# Patient Record
Sex: Male | Born: 1935 | Race: White | Hispanic: No | Marital: Married
Health system: Midwestern US, Academic
[De-identification: ages and names within clinical notes are randomized; demographics above are authoritative.]

---

## 2016-05-06 MED ORDER — WARFARIN 1 MG PO TAB
1 mg | ORAL_TABLET | Freq: Every day | ORAL | 3 refills | 90.00000 days | Status: DC
Start: 2016-05-06 — End: 2016-12-10

## 2016-05-07 MED ORDER — WARFARIN 1 MG PO TAB
ORAL_TABLET | Freq: Every day | ORAL | 1 refills | 90.00000 days | Status: DC
Start: 2016-05-07 — End: 2016-12-10

## 2016-07-01 ENCOUNTER — Encounter: Admit: 2016-07-01 | Discharge: 2016-07-01 | Payer: MEDICARE

## 2016-07-01 DIAGNOSIS — I480 Paroxysmal atrial fibrillation: Principal | ICD-10-CM

## 2016-07-02 ENCOUNTER — Encounter: Admit: 2016-07-02 | Discharge: 2016-07-02 | Payer: MEDICARE

## 2016-07-02 MED ORDER — WARFARIN 5 MG PO TAB
ORAL_TABLET | Freq: Every day | 1 refills | Status: CN
Start: 2016-07-02 — End: ?

## 2016-07-04 ENCOUNTER — Encounter: Admit: 2016-07-04 | Discharge: 2016-07-04 | Payer: MEDICARE

## 2016-07-04 MED ORDER — WARFARIN 5 MG PO TAB
ORAL_TABLET | Freq: Every day | 1 refills | Status: SS
Start: 2016-07-04 — End: 2016-12-10

## 2016-07-16 ENCOUNTER — Encounter: Admit: 2016-07-16 | Discharge: 2016-07-16 | Payer: MEDICARE

## 2016-07-16 DIAGNOSIS — I480 Paroxysmal atrial fibrillation: Principal | ICD-10-CM

## 2016-07-25 ENCOUNTER — Encounter: Admit: 2016-07-25 | Discharge: 2016-07-25 | Payer: MEDICARE

## 2016-07-25 DIAGNOSIS — I480 Paroxysmal atrial fibrillation: Principal | ICD-10-CM

## 2016-07-29 ENCOUNTER — Encounter: Admit: 2016-07-29 | Discharge: 2016-07-29 | Payer: MEDICARE

## 2016-07-29 NOTE — Telephone Encounter
Patient verbalized understanding and is willing to undertake the risk of holding his warfarin.  Patient will hold for the least amount of time.  He is calling to see if they will consider a lesser time than 5 days.  He currently will hold starting 7/6 and restart 7/11 taking 10 mg x 3 days then resuming 6 mg, recheck INR 7/18.

## 2016-07-29 NOTE — Telephone Encounter
-----   Message from Nehemiah Massed, MD sent at 07/29/2016  9:40 AM CDT -----  Regarding: RE: EGD and Biopsies  CJ: Mr. Pinkham CHA2DS2-VASc score is 3 for age and hypertension.  He is not a candidate for anticoagulation bridging.  He will have to accept the risk of holding his warfarin if required by the physician performing EGD.  He should be off warfarin for as short of a period as possible and resume warfarin as soon as his bleeding risk is no longer prohibitive.  Please document that the patient agrees to the risk.  Thanks. SBG  ----- Message -----  From: Avon Gully, RN  Sent: 07/29/2016   7:25 AM  To: Nehemiah Massed, MD  Subject: FW: EGD and Biopsies                              Patient has PAF, is having an EGD, ok to be off warfarin 5 days?  Ask if he will take responsibility for the risk of coming off warfarin?  Least amount of time off?  thanks  ----- Message -----  From: Dorene Sorrow, RN  Sent: 07/28/2016   2:54 PM  To: Avon Gully, RN  Subject: EGD and Biopsies                                 Call from Oliva Bustard at [facility]. Mr Diana is having EGD with possible biopsy on 7/11.Can he be off Warfarin for 5 days before the test?  Phone  [phone])  Fax     [phone])

## 2016-08-04 ENCOUNTER — Ambulatory Visit: Admit: 2016-08-04 | Discharge: 2016-08-04 | Payer: MEDICARE

## 2016-08-04 ENCOUNTER — Encounter: Admit: 2016-08-04 | Discharge: 2016-08-04 | Payer: MEDICARE

## 2016-08-04 DIAGNOSIS — I2510 Atherosclerotic heart disease of native coronary artery without angina pectoris: ICD-10-CM

## 2016-08-04 DIAGNOSIS — M069 Rheumatoid arthritis, unspecified: ICD-10-CM

## 2016-08-04 DIAGNOSIS — K529 Noninfective gastroenteritis and colitis, unspecified: ICD-10-CM

## 2016-08-04 DIAGNOSIS — I219 Acute myocardial infarction, unspecified: ICD-10-CM

## 2016-08-04 DIAGNOSIS — IMO0002 Squamous cell carcinoma: ICD-10-CM

## 2016-08-04 DIAGNOSIS — Z85828 Personal history of other malignant neoplasm of skin: ICD-10-CM

## 2016-08-04 DIAGNOSIS — L989 Disorder of the skin and subcutaneous tissue, unspecified: ICD-10-CM

## 2016-08-04 DIAGNOSIS — L568 Other specified acute skin changes due to ultraviolet radiation: ICD-10-CM

## 2016-08-04 DIAGNOSIS — B079 Viral wart, unspecified: ICD-10-CM

## 2016-08-04 DIAGNOSIS — R972 Elevated prostate specific antigen [PSA]: ICD-10-CM

## 2016-08-04 DIAGNOSIS — L853 Xerosis cutis: ICD-10-CM

## 2016-08-04 DIAGNOSIS — I8390 Asymptomatic varicose veins of unspecified lower extremity: ICD-10-CM

## 2016-08-04 DIAGNOSIS — I10 Essential (primary) hypertension: ICD-10-CM

## 2016-08-04 DIAGNOSIS — L818 Other specified disorders of pigmentation: ICD-10-CM

## 2016-08-04 DIAGNOSIS — L570 Actinic keratosis: ICD-10-CM

## 2016-08-04 DIAGNOSIS — D692 Other nonthrombocytopenic purpura: ICD-10-CM

## 2016-08-04 DIAGNOSIS — E785 Hyperlipidemia, unspecified: Principal | ICD-10-CM

## 2016-08-04 DIAGNOSIS — D229 Melanocytic nevi, unspecified: ICD-10-CM

## 2016-08-04 DIAGNOSIS — L821 Other seborrheic keratosis: ICD-10-CM

## 2016-08-04 DIAGNOSIS — K289 Gastrojejunal ulcer, unspecified as acute or chronic, without hemorrhage or perforation: ICD-10-CM

## 2016-08-04 NOTE — Progress Notes
Date of Service: 08/04/2016    Subjective:             Kenneth Robles is a 83 y.o. male.    History of Present Illness  Return. LV 03-2016  ???  1. H/o squamous cell carcinoma   - L superior helix???s/p Mohs 2015   ???  2. Actinic Keratosis/ Actinic Chelitis  - LN2 and PDT in the past. ???  - R post Arm. [id number]- inconclusive pathology. Re-biopsy 11-2015-c/w AK  - Using imiquimod once weekly to lip as well as [employer]     3. Melanocytic nevi distributed over the head, trunk, arms and legs.  - H/o???blistering sunburns    4. Seb dermL  - improving with ketoconazole shampoo and cream - uses 2-3x weekly  - he is applying shampoo for 5 minutes     5.???Seborrheic Keratosis on the torso and extremities.???    Family Hx: no h/o skin cancer. Brother with skin cancer, athology pending  Social Hx: retired, used to work in [employer]. 1 year marriage - 53???(wife present).???  ????????????????????????????????????????????????12-y younger brother died after experimental Leydig cancer immune tx        Review of Systems   Constitutional: Negative for appetite change and unexpected weight change.   Gastrointestinal: Negative for diarrhea, nausea and vomiting.   Skin: Negative for color change, pallor, rash and wound.         Objective:         ??? acetaminophen (TYLENOL) 325 mg tablet Take 325 mg by mouth every 4 hours as needed for Pain. 650 mg every 4 hours prn   ??? allopurinol (ZYLOPRIM) 100 mg tablet Take 1 Tab by mouth Daily.   ??? aspirin EC 81 mg PO tablet Take 81 mg by mouth daily.   ??? atorvastatin (LIPITOR) 10 mg tablet Take 10 mg by mouth twice daily.   ??? docusate (COLACE) 50 mg/5 mL oral solution Take 50 mg by mouth daily.   ??? doxazosin (CARDURA) 4 mg tablet Take 1 Tab by mouth daily.   ??? DULoxetine DR (CYMBALTA) 30 mg capsule Take 30 mg by mouth daily.   ??? fluticasone (FLONASE) 50 mcg/actuation nasal spray Apply 2 sprays to each nostril as directed twice daily.   ??? gabapentin (NEURONTIN) 300 mg capsule Take 300 mg by mouth every 8 hours. ??? hydroxychloroquine (PLAQUENIL) 200 mg PO tablet Take 200 mg by mouth twice daily.   ??? imiquimod(+) (ALDARA) 5 % topical cream Apply to lower lip once a week.   ??? isosorbide mononitrate SR (IMDUR) 30 mg tablet TAKE ONE TABLET BY MOUTH ONCE DAILY   ??? ketoconazole (NIZORAL) 2 % topical shampoo Apply topically to affected area of damp scalp, lather, leave on 5 minutes, and rinse. Repeat twice weekly for treatment of dandruff.   ??? nitroglycerin (NITROQUICK) 0.4 mg tablet 1 Tab every 5 minutes as needed for Chest Pain.   ??? prednisone (DELTASONE) 2.5 mg tablet Take 2.5 mg by mouth daily with breakfast.   ??? primidone (MYSOLINE) 50 mg tablet Take 1 tablet by mouth daily.   ??? propranolol (INDERAL) 80 mg tablet Take 80 mg by mouth twice daily.   ??? tamsulosin (FLOMAX) 0.4 mg capsule Take 1 capsule by mouth daily.   ??? tolterodine LA(+) (DETROL LA) 2 mg capsule Take 1 Cap by mouth daily. Can go up to 4 mg daily if needed  Indications: BLADDER HYPERACTIVITY   ??? warfarin (COUMADIN) 1 mg tablet TAKE ONE TO TWO TABLETS BY  MOUTH ONCE DAILY AS DIRECTED BY [facility].   ??? warfarin (COUMADIN) 1 mg tablet Take 1 tablet by mouth daily.   ??? warfarin (COUMADIN) 5 mg tablet Take 1 tablet by mouth daily as directed by cardiology for INR result.     Vitals:    08/04/16 0939   Weight: 99.8 kg (220 lb)   Height: 185.4 cm (73)     Body mass index is 29.03 kg/m???.     Physical Exam  Areas Examined (all normal unless noted below):  Head/Face  Neck  Chest/axillae  Back  Abdomen  Buttocks/groin  R upper ext  L upper ext  R lower ext  L lower ext  ???  Pertinent findings include:  Scar at site of prior SCC on L superior helix  Soft, pigmented, stuck-on-appearing papules are distributed over the trunk.??? All have symmetric pebbled dermoscopic findings.  Multiple brown and tan evenly pigmented macules are distributed over the head, neck,???trunk, arms and legs.??? All have symmetric similar dermascopic findings with primarily globular and reticular patterns.  Scaling without significant erythema is noted on the lower legs and forearms  Erythematous papules and plaques with soft greasy yellow scale are noted over the scalp  10 eythematous scaly papules are noted on lower lip (x4), R jaw (x1), L forearm (x4), R ear (x1)    Assessment and Plan:  ???  1. History of squamous cell carcinoma in 2015???  - No evidence of recurrence  ???  2. Actinic Keratosis  - LN2 x 10-f/t/f   - Continue imiquimod 5% cream BIW on lower lip  - [place]ger lip balm recommended  - sun protection advised  ???  3. Melanocytic nevi  - Will cont to monitor  - RTC for new/changing lesions  ???  4. Seborrheic keratoses:  - reassurance  ???  5. Xerosis  - Amlactin BID moisturizing to entire body  - decrease shower time and use lukewarm water  - only use body wash to axillae, groin, feet  ???  6. Seb derm  - Rx ketoconazole 2% shampoo TIW to scalp - leave in for 15 minutes before washing out  - Rx ketoconazole 2% cream BID TAA  - Rx desonide 0.05% cream to inflamed areas qday prn  ???  RTC 6 months

## 2016-08-04 NOTE — Progress Notes
ATTESTATION    I personally performed the key portions of the E/M visit, discussed case with resident and concur with resident documentation of history, physical exam, assessment, and treatment plan unless otherwise noted.  I performed the key components of the procedure including site identification, discussion with patient, cryo type and choice, and was present during the cryo to AKs on the lower lip (x4), R jaw (x1), L forearm (x4), R ear (x1)  Staff name:  Marsa Aris, MD Date:  08/04/2016

## 2016-08-04 NOTE — Procedures
Liquid Nitrogen Procedure Note    Risk and benefits of the above procedure including pain, dyspigmentation, scar, infection, recurrence were discussed with the patient (or legal guardian) in detail, who afterwards decided to proceed with the procedure.    Verbal informed consent given  Diagnosis: Actinic keratoses  Body site: lower lip (x4), R jaw (x1), L forearm (x4), R ear (x1)  Number of lesions: 10  Cycle duration: 10 sec  Number or cycles: 2   Wound care instructions given: Yes  Complications:  None  Tolerated well:  Yes  Ambulated from room:  Yes  Duration of procedure: > 67min

## 2016-08-05 ENCOUNTER — Encounter: Admit: 2016-08-05 | Discharge: 2016-08-05 | Payer: MEDICARE

## 2016-08-07 ENCOUNTER — Ambulatory Visit: Admit: 2016-08-07 | Discharge: 2016-08-08 | Payer: MEDICARE

## 2016-08-07 DIAGNOSIS — I480 Paroxysmal atrial fibrillation: ICD-10-CM

## 2016-08-07 DIAGNOSIS — R001 Bradycardia, unspecified: Principal | ICD-10-CM

## 2016-08-13 ENCOUNTER — Encounter: Admit: 2016-08-13 | Discharge: 2016-08-13 | Payer: MEDICARE

## 2016-08-22 ENCOUNTER — Encounter: Admit: 2016-08-22 | Discharge: 2016-08-22 | Payer: MEDICARE

## 2016-08-29 ENCOUNTER — Encounter: Admit: 2016-08-29 | Discharge: 2016-08-29 | Payer: MEDICARE

## 2016-08-29 NOTE — Progress Notes
Spoke with pt relative to results, dosage and date of next INR.

## 2016-09-03 ENCOUNTER — Ambulatory Visit: Admit: 2016-09-03 | Discharge: 2016-09-04 | Payer: MEDICARE

## 2016-09-03 ENCOUNTER — Encounter: Admit: 2016-09-03 | Discharge: 2016-09-03 | Payer: MEDICARE

## 2016-09-03 DIAGNOSIS — I480 Paroxysmal atrial fibrillation: Principal | ICD-10-CM

## 2016-09-03 NOTE — Telephone Encounter
Patient called to inform us he was started on anbx last Friday for pneumonia.  He reports he also had a dose of IV anbx.  He is not sure how much longer he has on his anbx.  He will have INR done today.

## 2016-09-04 DIAGNOSIS — Z950 Presence of cardiac pacemaker: Principal | ICD-10-CM

## 2016-09-05 ENCOUNTER — Encounter: Admit: 2016-09-05 | Discharge: 2016-09-05 | Payer: MEDICARE

## 2016-09-05 MED ORDER — METOPROLOL TARTRATE 50 MG PO TAB
50 mg | ORAL_TABLET | Freq: Two times a day (BID) | ORAL | 11 refills | 90.00000 days | Status: AC
Start: 2016-09-05 — End: 2016-09-30

## 2016-09-08 ENCOUNTER — Encounter: Admit: 2016-09-08 | Discharge: 2016-09-08 | Payer: MEDICARE

## 2016-09-08 DIAGNOSIS — I480 Paroxysmal atrial fibrillation: Principal | ICD-10-CM

## 2016-09-11 ENCOUNTER — Encounter: Admit: 2016-09-11 | Discharge: 2016-09-11 | Payer: MEDICARE

## 2016-09-11 NOTE — Progress Notes
Attempted to call pt and no answer. LM relative to results, dosage and date of next INR. Advised to call if questions

## 2016-09-15 ENCOUNTER — Encounter: Admit: 2016-09-15 | Discharge: 2016-09-15 | Payer: MEDICARE

## 2016-09-15 DIAGNOSIS — I480 Paroxysmal atrial fibrillation: Principal | ICD-10-CM

## 2016-09-15 LAB — COMPREHENSIVE METABOLIC PANEL
Lab: 0.4
Lab: 1.2 — ABNORMAL HIGH (ref 0.72–1.25)
Lab: 1.2 — ABNORMAL HIGH (ref 0.72–1.25)
Lab: 103 — ABNORMAL LOW (ref 42.0–52.0)
Lab: 103 — ABNORMAL LOW (ref 42.0–52.0)
Lab: 136 — ABNORMAL LOW (ref 4.70–6.10)
Lab: 136 — ABNORMAL LOW (ref 4.70–6.10)
Lab: 18
Lab: 18 — ABNORMAL HIGH (ref 27.0–31.0)
Lab: 22
Lab: 22 — ABNORMAL LOW (ref 23–31)
Lab: 3.1 — ABNORMAL LOW (ref 3.4–4.8)
Lab: 3.1 — ABNORMAL LOW (ref 3.4–4.8)
Lab: 4.1 — ABNORMAL LOW (ref 14.0–18.0)
Lab: 6.3
Lab: 6.3
Lab: 79 — ABNORMAL LOW (ref 83–110)
Lab: 79 — ABNORMAL LOW (ref 83–110)
Lab: 84
Lab: 9
Lab: 9

## 2016-09-15 LAB — MYOGLOBIN-CHEM: Lab: 91 — ABNORMAL HIGH (ref 27.0–31.0)

## 2016-09-15 LAB — CBC
Lab: 12 — ABNORMAL HIGH (ref 4.8–10.8)
Lab: 12 — ABNORMAL HIGH (ref 4.8–10.8)

## 2016-09-15 LAB — TROPONIN-I

## 2016-09-15 LAB — CREATINE KINASE-CPK: Lab: 40 — ABNORMAL LOW (ref 14.0–18.0)

## 2016-09-19 ENCOUNTER — Encounter: Admit: 2016-09-19 | Discharge: 2016-09-19 | Payer: MEDICARE

## 2016-09-19 DIAGNOSIS — I480 Paroxysmal atrial fibrillation: Principal | ICD-10-CM

## 2016-09-23 ENCOUNTER — Encounter: Admit: 2016-09-23 | Discharge: 2016-09-23 | Payer: MEDICARE

## 2016-09-23 NOTE — Progress Notes
Patient is here today continues to have issues with hypotension.  He has most recently discontinued propanolol bp continue to run lower 100-90/60-50.  Patient continues to deal with fatigue, dizziness and lightheadness.  He states that increasing fluid intake helps some.  Bp today in clinic was 86/54 P83.  He continues to work out riding stationary bike 2 miles today.

## 2016-09-23 NOTE — Progress Notes
Spoke to pt re: INR results. Discussed consistent daily dosing in attempt to better regulate INR. Will try 6.5mg  daily at this time and recheck INR next week. Kenneth Robles verbalized understanding and was agreeable to this plan. No further needs identified at this time.

## 2016-09-24 ENCOUNTER — Encounter: Admit: 2016-09-24 | Discharge: 2016-09-24 | Payer: MEDICARE

## 2016-09-24 NOTE — Telephone Encounter
-----   Message from Nehemiah Massed, MD sent at 09/23/2016  4:37 PM CDT -----   Richardson Landry: I do not believe that Mr. Knauff was having difficulty with hypotension when I last saw him.  If this is a new problem, then I would recommend that he be seen by the first available provider.  If necessary, he should go to urgent care.  His Imdur could be discontinued if he is having no angina.  Thanks. SBG  ----- Message -----  From: Asencion Noble  Sent: 09/23/2016   2:24 PM  To: Nehemiah Massed, MD    Patient is here today continues to have issues with hypotension.  He has most recently discontinued propanolol bp continue to run lower 100-90/60-50.  Patient continues to deal with fatigue, dizziness and lightheadness.  He states that increasing fluid intake helps some.  Bp today in clinic was 86/54 P83.  Did not know if you wanted to dec imdur or metoprolol

## 2016-09-24 NOTE — Telephone Encounter
Results and recommendations called to patient.

## 2016-09-26 ENCOUNTER — Encounter: Admit: 2016-09-26 | Discharge: 2016-09-26 | Payer: MEDICARE

## 2016-09-30 ENCOUNTER — Ambulatory Visit: Admit: 2016-09-30 | Discharge: 2016-10-01 | Payer: MEDICARE

## 2016-09-30 ENCOUNTER — Encounter: Admit: 2016-09-30 | Discharge: 2016-09-30 | Payer: MEDICARE

## 2016-09-30 DIAGNOSIS — L989 Disorder of the skin and subcutaneous tissue, unspecified: ICD-10-CM

## 2016-09-30 DIAGNOSIS — K289 Gastrojejunal ulcer, unspecified as acute or chronic, without hemorrhage or perforation: ICD-10-CM

## 2016-09-30 DIAGNOSIS — I219 Acute myocardial infarction, unspecified: ICD-10-CM

## 2016-09-30 DIAGNOSIS — M069 Rheumatoid arthritis, unspecified: ICD-10-CM

## 2016-09-30 DIAGNOSIS — R972 Elevated prostate specific antigen [PSA]: ICD-10-CM

## 2016-09-30 DIAGNOSIS — I10 Essential (primary) hypertension: ICD-10-CM

## 2016-09-30 DIAGNOSIS — I480 Paroxysmal atrial fibrillation: ICD-10-CM

## 2016-09-30 DIAGNOSIS — L818 Other specified disorders of pigmentation: ICD-10-CM

## 2016-09-30 DIAGNOSIS — I2510 Atherosclerotic heart disease of native coronary artery without angina pectoris: ICD-10-CM

## 2016-09-30 DIAGNOSIS — I959 Hypotension, unspecified: ICD-10-CM

## 2016-09-30 DIAGNOSIS — D692 Other nonthrombocytopenic purpura: ICD-10-CM

## 2016-09-30 DIAGNOSIS — R42 Dizziness and giddiness: ICD-10-CM

## 2016-09-30 DIAGNOSIS — Z950 Presence of cardiac pacemaker: ICD-10-CM

## 2016-09-30 DIAGNOSIS — I8390 Asymptomatic varicose veins of unspecified lower extremity: ICD-10-CM

## 2016-09-30 DIAGNOSIS — IMO0002 Degenerative disc disease: ICD-10-CM

## 2016-09-30 DIAGNOSIS — L853 Xerosis cutis: ICD-10-CM

## 2016-09-30 DIAGNOSIS — E785 Hyperlipidemia, unspecified: ICD-10-CM

## 2016-09-30 DIAGNOSIS — L570 Actinic keratosis: ICD-10-CM

## 2016-09-30 DIAGNOSIS — K529 Noninfective gastroenteritis and colitis, unspecified: ICD-10-CM

## 2016-09-30 DIAGNOSIS — B079 Viral wart, unspecified: ICD-10-CM

## 2016-10-03 ENCOUNTER — Ambulatory Visit: Admit: 2016-10-03 | Discharge: 2016-10-04 | Payer: MEDICARE

## 2016-10-03 ENCOUNTER — Encounter: Admit: 2016-10-03 | Discharge: 2016-10-03 | Payer: MEDICARE

## 2016-10-03 DIAGNOSIS — I959 Hypotension, unspecified: ICD-10-CM

## 2016-10-03 DIAGNOSIS — I10 Essential (primary) hypertension: ICD-10-CM

## 2016-10-03 DIAGNOSIS — I480 Paroxysmal atrial fibrillation: Principal | ICD-10-CM

## 2016-10-03 MED ORDER — PERFLUTREN LIPID MICROSPHERES 1.1 MG/ML IV SUSP
1-20 mL | Freq: Once | INTRAVENOUS | 0 refills | Status: CP
Start: 2016-10-03 — End: ?

## 2016-10-03 NOTE — Progress Notes
Peripheral IV Insertion Note:  Patient Side: left  Line Orientation:Antecubital  IV Catheter Size: 22G  Number of Attempts:1.  IV capped and flushed with Normal Saline.  IV site without redness, swelling, or pain.  New dressing placed.    After procedure IV cannula removed intact and hemostasis achieved.    Procedure explained, questions answered and Definity administered per standard without complications.   Total of _2__ ml of Definity/NS given slow IVP per sonographer direction.

## 2016-10-03 NOTE — Progress Notes
Spoke to pt re: INR results. Verified current Coumadin dose. Will continue current therapeutic dose and recheck in ~ 10 days. Pt verbalized understanding and was agreeable to this plan. No further needs identified at this time.

## 2016-10-13 ENCOUNTER — Encounter: Admit: 2016-10-13 | Discharge: 2016-10-13 | Payer: MEDICARE

## 2016-10-14 ENCOUNTER — Encounter: Admit: 2016-10-14 | Discharge: 2016-10-14 | Payer: MEDICARE

## 2016-10-14 ENCOUNTER — Ambulatory Visit: Admit: 2016-10-14 | Discharge: 2016-10-15 | Payer: MEDICARE

## 2016-10-14 DIAGNOSIS — I219 Acute myocardial infarction, unspecified: ICD-10-CM

## 2016-10-14 DIAGNOSIS — I10 Essential (primary) hypertension: ICD-10-CM

## 2016-10-14 DIAGNOSIS — I951 Orthostatic hypotension: ICD-10-CM

## 2016-10-14 DIAGNOSIS — L989 Disorder of the skin and subcutaneous tissue, unspecified: ICD-10-CM

## 2016-10-14 DIAGNOSIS — L570 Actinic keratosis: ICD-10-CM

## 2016-10-14 DIAGNOSIS — IMO0002 Degenerative disc disease: ICD-10-CM

## 2016-10-14 DIAGNOSIS — E785 Hyperlipidemia, unspecified: Principal | ICD-10-CM

## 2016-10-14 DIAGNOSIS — I8390 Asymptomatic varicose veins of unspecified lower extremity: ICD-10-CM

## 2016-10-14 DIAGNOSIS — K529 Noninfective gastroenteritis and colitis, unspecified: ICD-10-CM

## 2016-10-14 DIAGNOSIS — E7800 Pure hypercholesterolemia, unspecified: ICD-10-CM

## 2016-10-14 DIAGNOSIS — K289 Gastrojejunal ulcer, unspecified as acute or chronic, without hemorrhage or perforation: ICD-10-CM

## 2016-10-14 DIAGNOSIS — M069 Rheumatoid arthritis, unspecified: ICD-10-CM

## 2016-10-14 DIAGNOSIS — D692 Other nonthrombocytopenic purpura: ICD-10-CM

## 2016-10-14 DIAGNOSIS — L853 Xerosis cutis: ICD-10-CM

## 2016-10-14 DIAGNOSIS — I2510 Atherosclerotic heart disease of native coronary artery without angina pectoris: ICD-10-CM

## 2016-10-14 DIAGNOSIS — L818 Other specified disorders of pigmentation: ICD-10-CM

## 2016-10-14 DIAGNOSIS — I480 Paroxysmal atrial fibrillation: Principal | ICD-10-CM

## 2016-10-14 DIAGNOSIS — R972 Elevated prostate specific antigen [PSA]: ICD-10-CM

## 2016-10-14 DIAGNOSIS — B079 Viral wart, unspecified: ICD-10-CM

## 2016-10-16 ENCOUNTER — Encounter: Admit: 2016-10-16 | Discharge: 2016-10-16 | Payer: MEDICARE

## 2016-11-18 ENCOUNTER — Encounter: Admit: 2016-11-18 | Discharge: 2016-11-18 | Payer: MEDICARE

## 2016-11-18 DIAGNOSIS — E782 Mixed hyperlipidemia: Principal | ICD-10-CM

## 2016-11-18 MED ORDER — ATORVASTATIN 20 MG PO TAB
ORAL_TABLET | Freq: Every day | 0 refills | Status: AC
Start: 2016-11-18 — End: 2017-02-17

## 2016-11-18 NOTE — Telephone Encounter
Spoke with pt relative to the need of LP. Order mailed and the pt will have done at [facility]

## 2016-11-21 ENCOUNTER — Encounter: Admit: 2016-11-21 | Discharge: 2016-11-21 | Payer: MEDICARE

## 2016-11-21 DIAGNOSIS — I480 Paroxysmal atrial fibrillation: Principal | ICD-10-CM

## 2016-11-25 ENCOUNTER — Encounter: Admit: 2016-11-25 | Discharge: 2016-11-25 | Payer: MEDICARE

## 2016-11-25 DIAGNOSIS — E782 Mixed hyperlipidemia: Principal | ICD-10-CM

## 2016-11-25 LAB — LIPID PROFILE
Lab: 116 — ABNORMAL LOW (ref 150–200)
Lab: 3
Lab: 47 — ABNORMAL LOW
Lab: 113 — ABNORMAL HIGH
Lab: 23 — ABNORMAL LOW
Lab: 47

## 2016-11-25 NOTE — Progress Notes
Results released to mychart.

## 2016-11-27 ENCOUNTER — Encounter: Admit: 2016-11-27 | Discharge: 2016-11-27 | Payer: MEDICARE

## 2016-11-27 MED ORDER — NITROGLYCERIN 0.4 MG SL SUBL
0.4 mg | ORAL_TABLET | SUBLINGUAL | 1 refills | 9.00000 days | Status: AC | PRN
Start: 2016-11-27 — End: ?

## 2016-11-27 NOTE — Telephone Encounter
Patient called today asking for a refill on his nitro and to report an episode of chest pain last evening.  Chest pain lasted 20-30 min.  He did not have any associated sx with it.  He denies radiation and sob.  He was hypertensive after the pain resolved 166/85, 171/94.  He reports his blood pressure 121/89.  He is feeling better today and does not have any complaints.  Last MPI was 08/29/2015. Will review with SBG and let pt know if he has any recommendations.  Refill nitro sent to pharmacy.      Also requested pt to have INR done.  Pt will have INR done today.

## 2016-11-27 NOTE — Telephone Encounter
Discussed with SBG.  If chest pain continues ask pt to call back or go to the ER for evaluation.  Pt verbalizes understanding and agrees with plan of care.

## 2016-11-27 NOTE — Progress Notes
Pt reports that he had some extreme constipation which was resolved with multiple medications and therapies two days prior to his INR draw.  No other medication or diet changes.

## 2016-12-03 ENCOUNTER — Encounter: Admit: 2016-12-03 | Discharge: 2016-12-03 | Payer: MEDICARE

## 2016-12-03 NOTE — Progress Notes
Spoke to pt re: INR results. Verified current Coumadin dose. Pt will continue current therapeutic dose and recheck in ~ 3 weeks. Pt verbalized understanding and was agreeable to this plan. No further needs identified at this time.

## 2016-12-04 ENCOUNTER — Ambulatory Visit: Admit: 2016-12-03 | Discharge: 2016-12-04 | Payer: MEDICARE

## 2016-12-04 DIAGNOSIS — Z950 Presence of cardiac pacemaker: ICD-10-CM

## 2016-12-04 DIAGNOSIS — I495 Sick sinus syndrome: Principal | ICD-10-CM

## 2016-12-08 ENCOUNTER — Encounter: Admit: 2016-12-08 | Discharge: 2016-12-08 | Payer: MEDICARE

## 2016-12-08 ENCOUNTER — Encounter: Admit: 2016-12-08 | Discharge: 2016-12-09 | Payer: MEDICARE

## 2016-12-08 LAB — COMPREHENSIVE METABOLIC PANEL
Lab: 1.1
Lab: 105
Lab: 138
Lab: 25
Lab: 3.8
Lab: 7.1
Lab: 9.6

## 2016-12-08 LAB — CREATINE KINASE-CPK: Lab: 94

## 2016-12-08 LAB — MYOGLOBIN-CHEM: Lab: 79

## 2016-12-09 ENCOUNTER — Encounter: Admit: 2016-12-09 | Discharge: 2016-12-09 | Payer: MEDICARE

## 2016-12-09 ENCOUNTER — Inpatient Hospital Stay
Admit: 2016-12-09 | Discharge: 2016-12-10 | Disposition: A | Discharge status: Home / Self Care | Payer: MEDICARE | Source: Other Acute Inpatient Hospital

## 2016-12-09 DIAGNOSIS — Z950 Presence of cardiac pacemaker: ICD-10-CM

## 2016-12-09 DIAGNOSIS — R69 Illness, unspecified: ICD-10-CM

## 2016-12-09 DIAGNOSIS — E782 Mixed hyperlipidemia: ICD-10-CM

## 2016-12-09 LAB — CBC
Lab: 11 g/dL — ABNORMAL LOW (ref 13.5–16.5)
Lab: 126 10*3/uL — ABNORMAL LOW (ref 150–400)
Lab: 15 % — ABNORMAL HIGH (ref 11–15)
Lab: 3.8 M/UL — ABNORMAL LOW (ref 4.4–5.5)
Lab: 31 pg (ref 26–34)
Lab: 32 g/dL (ref 32.0–36.0)
Lab: 36 % — ABNORMAL LOW (ref 40–50)
Lab: 6.1 K/UL (ref 4.5–11.0)
Lab: 9.5 FL (ref >60)
Lab: 96 FL (ref 80–100)

## 2016-12-09 LAB — BASIC METABOLIC PANEL: Lab: 137 MMOL/L (ref 137–147)

## 2016-12-09 LAB — PROTIME INR (PT): Lab: 1.3 % — ABNORMAL HIGH (ref 0.8–1.2)

## 2016-12-09 LAB — PTT (APTT): Lab: 29 s (ref 20.0–36.0)

## 2016-12-09 LAB — MAGNESIUM: Lab: 2.1 mg/dL (ref 1.6–2.6)

## 2016-12-09 LAB — PHOSPHORUS: Lab: 3.7 mg/dL (ref 2.0–4.5)

## 2016-12-09 LAB — TROPONIN-I
Lab: 0 ng/mL (ref 0.0–0.05)
Lab: 0 ng/mL (ref 0.0–0.05)

## 2016-12-09 MED ORDER — FINASTERIDE 5 MG PO TAB
5 mg | Freq: Every day | ORAL | 0 refills | Status: DC
Start: 2016-12-09 — End: 2016-12-10
  Administered 2016-12-09 – 2016-12-10 (×2): 5 mg via ORAL

## 2016-12-09 MED ORDER — HEPARIN (PORCINE) BOLUS FOR CONTINUOUS INF (BAG)
20-40 [IU]/kg | INTRAVENOUS | 0 refills | Status: DC
Start: 2016-12-09 — End: 2016-12-10

## 2016-12-09 MED ORDER — ATORVASTATIN 20 MG PO TAB
20 mg | Freq: Every day | ORAL | 0 refills | Status: DC
Start: 2016-12-09 — End: 2016-12-10
  Administered 2016-12-09 – 2016-12-10 (×2): 20 mg via ORAL

## 2016-12-09 MED ORDER — HEPARIN (PORCINE) IN 5 % DEX 20,000 UNIT/500 ML (40 UNIT/ML) IV SOLP
0-2000 [IU]/h | INTRAVENOUS | 0 refills | Status: DC
Start: 2016-12-09 — End: 2016-12-10
  Administered 2016-12-09: 18:00:00 1000 [IU]/h via INTRAVENOUS

## 2016-12-09 MED ORDER — WARFARIN 6 MG PO TAB
6 mg | Freq: Every evening | ORAL | 0 refills | Status: DC
Start: 2016-12-09 — End: 2016-12-10
  Administered 2016-12-10: 04:00:00 6 mg via ORAL

## 2016-12-09 MED ORDER — SODIUM CHLORIDE 0.9 % IV SOLP
1000 mL | INTRAVENOUS | 0 refills | Status: DC
Start: 2016-12-09 — End: 2016-12-10
  Administered 2016-12-09: 18:00:00 1000 mL via INTRAVENOUS

## 2016-12-09 MED ORDER — ASPIRIN 81 MG PO CHEW
262 mg | Freq: Once | ORAL | 0 refills | Status: CP
Start: 2016-12-09 — End: ?
  Administered 2016-12-09: 18:00:00 262 mg via ORAL

## 2016-12-09 MED ORDER — ACETAMINOPHEN 325 MG PO TAB
650 mg | ORAL | 0 refills | Status: DC | PRN
Start: 2016-12-09 — End: 2016-12-10
  Administered 2016-12-10: 01:00:00 650 mg via ORAL

## 2016-12-09 MED ORDER — DIPHENHYDRAMINE HCL 50 MG/ML IJ SOLN
25 mg | INTRAVENOUS | 0 refills | Status: DC | PRN
Start: 2016-12-09 — End: 2016-12-10

## 2016-12-09 MED ORDER — HYDROXYCHLOROQUINE 200 MG PO TAB
200 mg | Freq: Two times a day (BID) | ORAL | 0 refills | Status: DC
Start: 2016-12-09 — End: 2016-12-10
  Administered 2016-12-09 – 2016-12-10 (×3): 200 mg via ORAL

## 2016-12-09 MED ORDER — DIPHENHYDRAMINE HCL 25 MG PO CAP
25 mg | ORAL | 0 refills | Status: DC | PRN
Start: 2016-12-09 — End: 2016-12-10

## 2016-12-09 MED ORDER — ASPIRIN 81 MG PO TBEC
81 mg | Freq: Every day | ORAL | 0 refills | Status: DC
Start: 2016-12-09 — End: 2016-12-10
  Administered 2016-12-09 – 2016-12-10 (×2): 81 mg via ORAL

## 2016-12-09 MED ORDER — NITROGLYCERIN 0.4 MG SL SUBL
.4 mg | SUBLINGUAL | 0 refills | Status: DC | PRN
Start: 2016-12-09 — End: 2016-12-10
  Administered 2016-12-09: 16:00:00 0.4 mg via SUBLINGUAL

## 2016-12-09 MED ORDER — PRIMIDONE 50 MG PO TAB
50 mg | Freq: Every day | ORAL | 0 refills | Status: DC
Start: 2016-12-09 — End: 2016-12-10
  Administered 2016-12-09 – 2016-12-10 (×2): 50 mg via ORAL

## 2016-12-09 MED ORDER — ALLOPURINOL 100 MG PO TAB
100 mg | Freq: Every day | ORAL | 0 refills | Status: DC
Start: 2016-12-09 — End: 2016-12-10
  Administered 2016-12-09 – 2016-12-10 (×2): 100 mg via ORAL

## 2016-12-09 MED ORDER — WARFARIN 1 MG PO TAB
1 mg | Freq: Every evening | ORAL | 0 refills | Status: DC
Start: 2016-12-09 — End: 2016-12-09

## 2016-12-09 MED ORDER — HEPARIN (PORCINE) BOLUS FOR CONTINUOUS INF (BAG)
4000 [IU] | Freq: Once | INTRAVENOUS | 0 refills | Status: CP
Start: 2016-12-09 — End: ?

## 2016-12-09 MED ORDER — SODIUM CHLORIDE 0.9 % IV SOLP
250 mL | INTRAVENOUS | 0 refills | Status: CP
Start: 2016-12-09 — End: ?

## 2016-12-09 MED ORDER — ONDANSETRON HCL (PF) 4 MG/2 ML IJ SOLN
4 mg | INTRAVENOUS | 0 refills | Status: DC | PRN
Start: 2016-12-09 — End: 2016-12-10

## 2016-12-09 MED ORDER — METOPROLOL SUCCINATE 25 MG PO TB24
50 mg | Freq: Every day | ORAL | 0 refills | Status: DC
Start: 2016-12-09 — End: 2016-12-10
  Administered 2016-12-10 (×2): 50 mg via ORAL

## 2016-12-09 MED ORDER — PREDNISONE 2.5 MG PO TAB
2.5 mg | Freq: Every day | ORAL | 0 refills | Status: DC
Start: 2016-12-09 — End: 2016-12-10
  Administered 2016-12-09 – 2016-12-10 (×2): 2.5 mg via ORAL

## 2016-12-09 MED ORDER — GABAPENTIN 300 MG PO CAP
600 mg | Freq: Three times a day (TID) | ORAL | 0 refills | Status: DC
Start: 2016-12-09 — End: 2016-12-10
  Administered 2016-12-09 – 2016-12-10 (×4): 600 mg via ORAL

## 2016-12-09 MED ORDER — SODIUM CHLORIDE 0.9 % IV SOLP
INTRAVENOUS | 0 refills | Status: DC
Start: 2016-12-09 — End: 2016-12-09

## 2016-12-09 MED ORDER — IMS MIXTURE TEMPLATE
60 mg | Freq: Once | ORAL | 0 refills | Status: CP
Start: 2016-12-09 — End: ?
  Administered 2016-12-09 (×2): 60 mg via ORAL

## 2016-12-09 NOTE — Progress Notes
RT Adult Assessment Note    NAME:Pacer GRANTLEY SAVAGE             MRN: [id number]             DOB:[birth year]          AGE: 83 y.o.  ADMISSION DATE: 12/09/2016             DAYS ADMITTED: LOS: 0 days    RT Treatment Plan:            Additional Comments:  Impressions of the patient: on RA, alert. NAD  Intervention(s)/outcome(s): RT evaluation completed. RT criteria not met.  Patient education that was completed: none  Recommendations to the care team: none    Vital Signs:  Pulse: Pulse: 60  RR: Respirations: 14 PER MINUTE  SpO2: SpO2: 96 %  O2 Device:    Liter Flow:    O2%: O2 Percent: 21 %  Breath Sounds:    Respiratory Effort: Respiratory Effort: Non-Labored

## 2016-12-09 NOTE — Progress Notes
Pt arrived to unit by EMS.  A&Ox4, minimal chest pain.  A-paced per ppm.  SBP 130s.  Lungs clear on room air.  Awaiting provider orders.

## 2016-12-09 NOTE — Case Management (ED)
Case Management Admission Assessment    NAME:Kenneth Robles                          MRN: [id number]             DOB:Aug 08, 1930          AGE: 83 y.o.  ADMISSION DATE: 12/09/2016             DAYS ADMITTED: LOS: 0 days      Today???s Date: 12/09/2016    Source of Information: Patient, spouse, and EMR    Plan  Plan: CM Assessment, Assist PRN with SW/NCM Services, Discharge Planning for Home Anticipated  ??? Patient anticipated to discharge home at the time DC.  ??? Patient scheduled to have heart cath today.  ??? Patient lives with his spouse, Alona Bene [phone] who will provide transportation at the time of discharge.  ??? Patient denies any case management needs at this time. Will continue to follow to assist with any NCM needs indicated.     Patient Address/Phone  [address]  [place] [address]  [phone] (home)     Emergency Contact  Extended Emergency Contact Information  Primary Emergency Contact: Barrie,Joyce  Address: [address]           [place] [address] [place]  Home Phone: [phone]  Mobile Phone: [phone]  Relation: Spouse    Healthcare Directive  Healthcare Directive: Yes, patient has a healthcare directive  Type of Healthcare Directive: Living Will  Location of Healthcare Directive: Current and verified in document scanning system  Would patient like to fill out a (a new) Healthcare Directive?: No, patient declined  Psych Advance Directive (Psych unit only): No, patient does not have a Social research officer, government  Does the patient need discharge transport arranged?: No  Transportation Name, Phone and Availability #1: Patient's spouse, Alona Bene [phone] will provide transportation at the time of discharge.  Does the patient use Medicaid Transportation?: No    Expected Discharge Date  Expected Discharge Date: 12/09/16    Living Situation Prior to Admission  ? Living Arrangements  Type of Residence: Home, independent Living Arrangements: Spouse/significant other  Financial risk analyst / Tub: Tub/Shower Unit  How many levels in the residence?: 1  Can patient live on one level if needed?: Yes  Does residence have entry and/or side stairs?: No  Assistance needed prior to admit or anticipated on discharge: No  Who provides assistance or could if needed?: Patient's wife, Alona Bene is able to provide assistance as needed. Patient also has a son, Raiford Noble that is present at the bedside.  Are they in good health?: Yes  Can support system provide 24/7 care if needed?: Maybe  ? Level of Function   Prior level of function: Independent (Patient is independent of all ADLs and in community.)  ? Cognitive Abilities   Cognitive Abilities: Alert and Oriented, Engages in problem solving and planning, Participates in decision making, Recognizes impact of health condition on lifestyle, Understands nature of health condition    Financial Resources  ? Coverage  Primary Insurance: Medicare  Secondary Insurance: Nurse, learning disability  Additional Coverage: RX ([employer] Part D for RX coverage. Patient states that prescriptions are affordable. Patient is not currently in the donut hole.)    ? Source of Income   Source Of Income: SSI  ? Financial Assistance Needed?  Patient and spouse deny any financial concerns at this time.  Psychosocial Needs  ? Mental Health  Mental Health History: No  ? Substance Use History  Substance Use History Screen: No  ? Other  None    Current/Previous Services  ? PCP  Lona Kettle, [phone], [phone]  ? Pharmacy    [facility] [address] [place] [address] [place] [address] [place] [address]  [place] [address]  Phone: [phone] Fax: [phone]    [facility]  [address]  [place] [address]  Phone: [phone] Fax: [phone]    ? Durable Psychologist, educational at home: [employer], Single [employer], Wheelchair (manual)  ? Home Health  Receiving home health: No ? Hemodialysis or Peritoneal Dialysis  Undergoing hemodialysis or peritoneal dialysis: No  ? Tube/Enteral Feeds  Receive tube/enteral feeds: No  ? Infusion  Receive infusions: No  ? Private Duty  Private duty help used: No  ? Home and Community Based Services  Home and community based services: No  ? [employer]: N/A  ? Hospice  Hospice: No  ? Outpatient Therapy  PT: Yes  When did patient receive care?: Currently using  Name of rehab location/group: [facility]  Would patient return for future services?: Yes  OT: No  SLP: No  ? Skilled Nursing Facility/Nursing Home  SNF: No  NH: No  ? Inpatient Rehab  IPR: No  ? Long-Term Acute Care Hospital  LTACH: No  ? Acute Hospital Stay  Acute Hospital Stay: In the past  Was patient's stay within the last 30 days?: No    Lilli Few, BSN, RN  Nurse Case Manager  [phone]

## 2016-12-10 DIAGNOSIS — I351 Nonrheumatic aortic (valve) insufficiency: ICD-10-CM

## 2016-12-10 DIAGNOSIS — Z955 Presence of coronary angioplasty implant and graft: ICD-10-CM

## 2016-12-10 DIAGNOSIS — K5900 Constipation, unspecified: ICD-10-CM

## 2016-12-10 DIAGNOSIS — I25110 Atherosclerotic heart disease of native coronary artery with unstable angina pectoris: Principal | ICD-10-CM

## 2016-12-10 DIAGNOSIS — I252 Old myocardial infarction: ICD-10-CM

## 2016-12-10 DIAGNOSIS — T82858A Stenosis of vascular prosthetic devices, implants and grafts, initial encounter: ICD-10-CM

## 2016-12-10 DIAGNOSIS — M069 Rheumatoid arthritis, unspecified: ICD-10-CM

## 2016-12-10 DIAGNOSIS — Z79899 Other long term (current) drug therapy: ICD-10-CM

## 2016-12-10 DIAGNOSIS — M549 Dorsalgia, unspecified: ICD-10-CM

## 2016-12-10 DIAGNOSIS — N400 Enlarged prostate without lower urinary tract symptoms: ICD-10-CM

## 2016-12-10 DIAGNOSIS — R51 Headache: ICD-10-CM

## 2016-12-10 DIAGNOSIS — R079 Chest pain, unspecified: Secondary | ICD-10-CM

## 2016-12-10 DIAGNOSIS — I129 Hypertensive chronic kidney disease with stage 1 through stage 4 chronic kidney disease, or unspecified chronic kidney disease: ICD-10-CM

## 2016-12-10 DIAGNOSIS — I8390 Asymptomatic varicose veins of unspecified lower extremity: ICD-10-CM

## 2016-12-10 DIAGNOSIS — I480 Paroxysmal atrial fibrillation: ICD-10-CM

## 2016-12-10 DIAGNOSIS — I447 Left bundle-branch block, unspecified: ICD-10-CM

## 2016-12-10 DIAGNOSIS — N189 Chronic kidney disease, unspecified: ICD-10-CM

## 2016-12-10 DIAGNOSIS — E785 Hyperlipidemia, unspecified: ICD-10-CM

## 2016-12-10 DIAGNOSIS — M109 Gout, unspecified: ICD-10-CM

## 2016-12-10 LAB — CBC AND DIFF: Lab: 6.2 K/UL — ABNORMAL HIGH (ref 4.5–11.0)

## 2016-12-10 LAB — BASIC METABOLIC PANEL: Lab: 136 MMOL/L — ABNORMAL LOW (ref 137–147)

## 2016-12-10 LAB — POC ACTIVATED CLOTTING TIME: Lab: 166 s

## 2016-12-10 LAB — PROTIME INR (PT): Lab: 1.4 MMOL/L — ABNORMAL HIGH (ref >60)

## 2016-12-10 MED ORDER — WARFARIN 1 MG PO TAB
1 mg | ORAL_TABLET | Freq: Every day | ORAL | 3 refills | 90.00000 days | Status: AC
Start: 2016-12-10 — End: 2017-05-11

## 2016-12-10 MED ORDER — METOPROLOL SUCCINATE 25 MG PO TB24
25 mg | ORAL_TABLET | Freq: Every day | ORAL | 3 refills | 90.00000 days | Status: AC
Start: 2016-12-10 — End: 2017-04-17

## 2016-12-10 MED ORDER — WARFARIN 5 MG PO TAB
ORAL_TABLET | Freq: Every day | ORAL | 1 refills | 90.00000 days | Status: AC
Start: 2016-12-10 — End: 2017-06-17

## 2016-12-10 NOTE — Progress Notes
Pt [place] home with family. WC to family car.

## 2016-12-10 NOTE — Case Management (ED)
Case Management Progress Note    NAME:Kenneth Robles                          MRN: [id number]              DOB:12-24-1930          AGE: 83 y.o.  ADMISSION DATE: 12/09/2016             DAYS ADMITTED: LOS: 1 day      Todays Date: 12/10/2016    Plan  Anticipated discharge home today.   Per Attending, anticipated that patient will discharge home today with family support.   Patient's spouse, Blanch Media [phone] to provide transportation at the time of discharge.   Patient has discharge orders in.   No case management needs at this time.    Interventions  ? Support   Support: No Needs Identified  ? Info or Referral   Information or Referral to Community Resources: No Needs Identified  ? Discharge Planning   Discharge Planning: No Needs Identified  ? Medication Needs   Medication Needs: No Needs Identified  ? Financial   Financial: No Needs Identified  ? Legal   Legal: No Needs Identified  ? Other   Other/None: No needs identified    Disposition  ? Expected Discharge Date    Expected Discharge Date: 12/10/16  ? Transportation   Does the patient need discharge transport arranged?: No  Transportation Name, Phone and Availability #1: Patient's spouse, Blanch Media [phone] will provide transportation at the time of discharge.  Does the patient use Medicaid Transportation?: No  ? Next Level of Care (Acute Psych discharges only)      ? Discharge Disposition    Home with family support.  Durable Medical Equipment     No service has been selected for the patient.      [place] Dest[facility]     No service has been selected for the patient.      [place]     No service has been selected for the patient.      [facility]     No service has been selected for the patient.        Mellody Drown, BSN, RN  Nurse Case Manager  [phone]

## 2016-12-10 NOTE — Progress Notes
DC instructions completed with pt and wife at bedside. Answered all questions.

## 2016-12-10 NOTE — Consults
CARDIOLOGY CONSULT SERVICE INITIAL CONSULT NOTE      TODAY'S DATE:  12/09/2016  PATIENT NAME:  Kenneth Robles  - MRN:  [id number]   ADMISSION DATE:  12/09/2016  LOS: 0 days    REASON FOR CONSULT:  Chest pain, concern for instable angina    ACTIVE HOSPITAL PROBLEM LIST:  Active Problems:    Chest pain      IMPRESSION:  ??? Progressive angina, refractory to nitroglycerin after being previously responsive  ??? Coronary artery disease status post prior PCI to LAD x2  ??? Hypertension  ??? Chronic kidney disease  ??? Paroxysmal atrial fibrillation  ??? Hyperlipidemia  ??? Sick sinus syndrome status post PPM implantation  ??? Rheumatoid arthritis on immunosuppressive therapy    Concerning chest pain syndrome with progressive symptoms in recent weeks but onset about 2 months ago. Pain now is at rest with typical features he doesn't recall having previously. With the constellation of sympotoms referred for left heart catheterization and coronary angiography, which was unremarkable for acute changes. Given these findings, will continue medical management.    RECOMMENDATIONS:  ??? Discontinue therapeutic heparin infusion  ??? Resume aspirin 81 mg PO daily, atorvastatin 20 mg PO daily (reduced dose in light of historical statin intolerance)  ??? Recommend initiation of beta blocker with metoprolol succinate 50 mg PO daily  ??? Outpatient follow-up in 4-6 weeks; if persistent anginal symptoms can consider uptitration of BB versus initiation of long acting nitrate  ??? No further recommendations from the cardiovascular standpoint at this time    Thank you for the opportunity to participate in the care of this patient.  Mr. Pilson was seen and plan of care discussed with staff cardiologist, Dr. Raynelle Bring.    Please call or page with questions.  After 5PM please call Cardiology fellow on call.  Monday - Friday 8AM-5PM please call Cardiology consult pager.    -----  Janice Norrie, MD  Fellow, Cardiovascular Diseases  Pager: [phone] 12/09/16 - 6:38 PM      ______________________________________________________________________    HPI: Kenneth Robles is a 83 y.o. male with a significant PMH as outlined above who was admitted to [facility] earlier this morning after being transferred from an OSH with chest pain and concern for unstable angina. The patient reports progressive chest pain symptoms involving substernal pressure occurring at rest without significant radiation elsewhere.The pain symptoms worsened the night of admission to include shortness of breath, as wel las jaw and left arm pain. The symptoms were relieved initially with nitroglycerin and ultimately the patient required nitro patch at the OSH prior to transfer. He doesn't recall similar symptoms in 2011 when he underwent PCI, nor do his other family members present during my evaluation. Currently he reports persistent pain at 3/10. He denies orthopnea, paroxysmal nocturnal dyspnea, lower extremity edema, claudications, syncope, near syncope, and new neurologic deficits.      Past Medical History:   Diagnosis Date   ??? Actinic keratosis    ??? CAD (coronary artery disease) 07/05/2009   ??? Cataract    ??? Degenerative disc disease    ??? Elevated PSA    ??? Essential hypertension, benign    ??? Heart attack (HCC)    ??? Hyperlipidemia    ??? Inflammatory bowel disease    ??? Melanotic macule of labia    ??? Purpura (HCC)     Bateman's purpura   ??? Rheumatoid arthritis(714.0) 03/09/2014   ??? Skin lesion     lentigenes   ??? Squamous cell  carcinoma    ??? Ulcer of the stomach and intestine    ??? Varicose vein    ??? Warts    ??? Xerosis of skin      Past Surgical History:   Procedure Laterality Date   ??? SKIN BIOPSY  11/26/09    at [facility] on left upper lip   ??? HX HERNIA REPAIR     ??? HX LUMBAR LAMINECTOMY     ??? HX VASECTOMY       Social History     Social History   ??? Marital status: Married     Spouse name: N/A   ??? Number of children: N/A   ??? Years of education: N/A     Social History Main Topics ??? Smoking status: Never Smoker   ??? Smokeless tobacco: Never Used   ??? Alcohol use No   ??? Drug use: No   ??? Sexual activity: Not Currently     Partners: Female     Other Topics Concern   ??? Not on file     Social History Narrative   ??? No narrative on file     Family History   Problem Relation Age of Onset   ??? Heart Failure Mother    ??? Hypertension Mother    ??? Heart Attack Father    ??? Heart Failure Father    ??? Asthma Father    ??? Heart Attack Brother    ??? Hypertension Brother    ??? Asthma Brother    ??? High Cholesterol Brother    ??? Cancer Brother    ??? Heart Attack Maternal Uncle    ??? Cancer Maternal Uncle    ??? Heart Attack Paternal Aunt        ALLERGIES: Statins-hmg-coa reductase inhibitors; Crestor [rosuvastatin]; Lipitor [atorvastatin]; Zetia [ezetimibe]; and Zocor [simvastatin]    CURRENT FACILITY MEDICATIONS:  Scheduled Meds:  allopurinol (ZYLOPRIM) tablet 100 mg 100 mg Oral QDAY   aspirin EC tablet 81 mg 81 mg Oral QDAY   atorvastatin (LIPITOR) tablet 20 mg 20 mg Oral QDAY   finasteride (PROSCAR) tablet 5 mg 5 mg Oral QDAY   gabapentin (NEURONTIN) capsule 600 mg 600 mg Oral TID   heparin (porcine) BOLUS for continuous inf (bag) [phone] Units 20-40 Units/kg Intravenous As Prescribed   hydroxychloroquine (PLAQUENIL) tablet 200 mg 200 mg Oral BID   prednisone (DELTASONE) tablet 2.5 mg 2.5 mg Oral QDAY w/breakfast   primidone (MYSOLINE) tablet 50 mg 50 mg Oral QDAY   warfarin (COUMADIN) tablet 6 mg 6 mg Oral QHS   Continuous Infusions:  ??? heparin (porcine) 20,000 units/D5W 500 mL infusion (std conc)(premade) Stopped (12/09/16 1500)   ??? sodium chloride 0.9 %   infusion 1,000 mL (12/09/16 1144)     PRN and Respiratory Meds:diphenhydrAMINE Q4H PRN **OR** diphenhydrAMINE Q4H PRN, nitroglycerin Q5 MIN PRN, ondansetron (ZOFRAN) IV Q6H PRN      REVIEW OF SYSTEMS:  A 10-point review of systems was negative except for those items noted in the HPI.    Vital Signs:  Last Filed in 24 hours Vital Signs:  24 hour Range BP: 126/74 (11/13 1730)  Temp: 36.3 ???C (97.4 ???F) (11/13 1600)  Pulse: 92 (11/13 1800)  Respirations: 17 PER MINUTE (11/13 1800)  SpO2: 96 % (11/13 1730)  O2 Delivery: None (Room Air) (11/13 1800)  SpO2 Pulse: 59 (11/13 1730)  Height: 185.4 cm (6' 1) (11/13 0200) BP: (104-149)/(55-81)   Temp:  [36.3 ???C (97.4 ???F)]   Pulse:  [60-92]  Respirations:  [11 PER MINUTE-29 PER MINUTE]   SpO2:  [92 %-98 %]   O2 Delivery: None (Room Air)     INTAKE/OUTPUT SUMMARY (Last 24 hours):    Intake/Output Summary (Last 24 hours) at 12/09/16 Paulo Fruit  Last data filed at 12/09/16 1800   Gross per 24 hour   Intake           899.58 ml   Output              700 ml   Net           199.58 ml         PHYSICAL EXAMINATION:  General:  Alert, cooperative, no distress, appears stated age  Head:  Normocephalic, atraumatic  Eyes:  Conjunctivae/corneas clear, EOM intact  Throat:  Mucous membranes moist  Neck:  Supple, symmetric, trachea midline, no carotid bruit, no hepatojugular reflux, no JVD  Back:  Symmetric, no abnormal curvature, normal ROM  Lungs:  Clear to auscultation bilaterally, no crackles/wheezes/rhonchi  Chest wall:  No significant tenderness or deformity  Heart:  Regular rate and rhythm, normal S1/S2, no murmur/rub/gallop  Abdomen:  Soft, non-tender, normal bowel sounds, no palpable masses  Extremities:  Extremities normal, atraumatic, no cyanosis, no edema  Pulses:  Symmetric and 2+ in all extremities  Skin:   Skin color, texture, turgor normal.  No rashes or lesions      POC TESTING (Last 24 hours):  Glucose: 95 (12/09/16 0440)    CARDIOGRAPHICS:  ECG: Atrially paced rhythm with left bundle branch block and QRSd 140 msec. No significant ischemic changes or conduction abnormalities.    CHEST X-RAY:  No recent study available for review.    LAST ECHOCARDIOGRAM 10/03/16:  Interpretation Summary     1. Normal left ventricular ejection fraction.  Left ventricular cavity mildly dilated with mild concentric hypertrophy.   2. LVEF= 60%. 3. Mild aortic regurgitation. No other valvular abnormalities noted.    4. Right ventricular function normal however size moderately dilated.  5. Pulmonary artery systolic pressure cannot be calculated from this study.  5. Compaired to previous ECHO on 10/11/2012, no major change noted.       LAST STRESS TEST RESULT 08/29/15:  SUMMARY/OPINION:  This study is mildly abnormal, indicative of a small mild intensity area of reversible ischemia involving the distal inferior wall.  The appearance is very similar to what was seen in 2012. The left ventricular systolic function is normal. There are no high risk prognostic indicators present.    ???  Overall, this is still a low risk study.      LAST CARDIAC CATHETERIZATION 12/09/16:  FINAL IMPRESSION:    1. Mild to moderate nonobstructive coronary artery disease, as described above.  2. Patent LAD stent with mild in-stent restenosis, which appears to be unchanged from prior cath.  3. Normal LVEDP.  4. No significant gradient across the aortic valve on pullback.  ???  RECOMMENDATIONS:  The present study appears to be grossly unchanged compared to a prior study in 2014.  Therefore, we will recommend medical management at this time.      LAB/OTHER DIAGNOSTIC TESTING:  24-hour labs:    Results for orders placed or performed during the hospital encounter of 12/09/16 (from the past 24 hour(s))   MAGNESIUM    Collection Time: 12/09/16  4:40 AM   Result Value Ref Range    Magnesium 2.1 1.6 - 2.6 mg/dL   PHOSPHORUS    Collection Time: 12/09/16  4:40 AM  Result Value Ref Range    Phosphorus 3.7 2.0 - 4.5 MG/DL   TROPONIN-I    Collection Time: 12/09/16  4:40 AM   Result Value Ref Range    Troponin-I 0.01 0.0 - 0.05 NG/ML   PROTIME INR (PT)    Collection Time: 12/09/16  4:40 AM   Result Value Ref Range    INR 1.3 (H) 0.8 - 1.2   BASIC METABOLIC PANEL    Collection Time: 12/09/16  4:40 AM   Result Value Ref Range    Sodium 137 137 - 147 MMOL/L    Potassium 4.1 3.5 - 5.1 MMOL/L Chloride 104 98 - 110 MMOL/L    CO2 24 21 - 30 MMOL/L    Anion Gap 9 3 - 12    Glucose 95 70 - 100 MG/DL    Blood Urea Nitrogen 17 7 - 25 MG/DL    Creatinine 1.61 0.4 - 1.24 MG/DL    Calcium 9.0 8.5 - 09.6 MG/DL    eGFR Non African American >60 >60 mL/min    eGFR African American >60 >60 mL/min   TROPONIN-I    Collection Time: 12/09/16 11:20 AM   Result Value Ref Range    Troponin-I 0.01 0.0 - 0.05 NG/ML   PTT (APTT)    Collection Time: 12/09/16 11:20 AM   Result Value Ref Range    APTT 29.5 20.0 - 36.0 SEC   CBC    Collection Time: 12/09/16 11:45 AM   Result Value Ref Range    White Blood Cells 6.1 4.5 - 11.0 K/UL    RBC 3.80 (L) 4.4 - 5.5 M/UL    Hemoglobin 11.9 (L) 13.5 - 16.5 GM/DL    Hematocrit 04.5 (L) 40 - 50 %    MCV 96.0 80 - 100 FL    MCH 31.4 26 - 34 PG    MCHC 32.7 32.0 - 36.0 G/DL    RDW 40.9 (H) 11 - 15 %    Platelet Count 126 (L) 150 - 400 K/UL    MPV 9.5 7 - 11 FL       OTHER RADIOLOGY/DIAGNOSTICS:     Pertinent radiology reviewed.

## 2016-12-10 NOTE — Progress Notes
Brief Discharge Day Note    Patient is doing well today. No acute events overnight. No new complains. No recurrence of his chest pain. No bleeding from cath site.    ROS: No CP, SOA, cough, abd pain, N/V, dysuria, hematuria, fevers, chills or night sweats.    Physical exam: Patient is A&O, no distress, neck is supple, heart and lung sounds clear, abdomen soft and non-tender, extremities without edema, no neurologic deficits. Cath site without bleeding, hematoma, or bruits.    VS and labs reviewed. BP stable. INR still subtherapeutic.    Meds reviewed. Patient with h/o orthostasis with Imdur in the past. Decrease Toprol XL to 25 mg daily. Discussed with Dr Luisa Hart.     Reviewed [facility] results.     IMPRESSION:  Active Problems:    Chest pain    Unstable Angina    Atrial Fibrillation    CAD s/p PCI    BPH    HTN    HLD    PLAN:  Patient is stable for discharge today. Cont PTA meds. Added Torpol XL to discharge meds. I instructed the patient to take 9 mg of Warfarin tonight and go back to 6 mg starting tomorrow. Will have him check his INR on Friday and Monday and call results into the cardiology clinic for further adjustments.    Plan was discussed in length with the patient and his wife. All questions were answered to their best satisfaction.    I spent more than 30 minutes evaluating the patient, reviewing medical chart, directing medication management, reviewing vitals, labs and radiology results and coordinating discharge. More than 50% of the time was spent in face-to-face contact with the patient at bedside.    Prince Rome, MD  Clinical Assistant Professor - Internal Medicine Dept  [phone]

## 2016-12-11 LAB — POC ACTIVATED CLOTTING TIME: Lab: 200 s

## 2016-12-12 ENCOUNTER — Encounter: Admit: 2016-12-12 | Discharge: 2016-12-12 | Payer: MEDICARE

## 2016-12-12 NOTE — Telephone Encounter
Received msg on triage line from pt stating he recently had heart cath at [facility] and was advised to have INR today for f/u, but has not heard results. He asks for call back to preferred # on chart with INR results. Routed to [facility]. [facility] staff to f/u with pt.

## 2016-12-14 NOTE — Discharge Instructions - Pharmacy
Physician Discharge Summary      Name: Kenneth Robles  Medical Record Number: [id number]        Account Number:  [id number]  Date Of Birth:  [birth year]                         Age:  83 years   Admit date:  12/09/2016                     Discharge date:  12/10/2016    Attending Physician: Melvyn Novas, MD              Service: Med Private L- [phone]    Physician Summary completed by: Melvyn Novas, MD    Reason for hospitalization: Angina    Significant PMH:   Past Medical History:   Diagnosis Date   ??? Actinic keratosis    ??? CAD (coronary artery disease) 07/05/2009   ??? Cataract    ??? Degenerative disc disease    ??? Elevated PSA    ??? Essential hypertension, benign    ??? Heart attack (HCC)    ??? Hyperlipidemia    ??? Inflammatory bowel disease    ??? Melanotic macule of labia    ??? Purpura (HCC)     Bateman's purpura   ??? Rheumatoid arthritis(714.0) 03/09/2014   ??? Skin lesion     lentigenes   ??? Squamous cell carcinoma    ??? Ulcer of the stomach and intestine    ??? Varicose vein    ??? Warts    ??? Xerosis of skin        Allergies: Statins-hmg-coa reductase inhibitors; Crestor [rosuvastatin]; Lipitor [atorvastatin]; Zetia [ezetimibe]; and Zocor [simvastatin]    Admission Physical Exam notable for:    CONSTITUTIONAL: Vitals signs reviewed. Well developed, well nourished, resting comfortably and in no distress.   ENT and MOUTH: Atraumatic, normocephalic. Visible oro-nasopharynx without erythema or exudate. Moist mucus membranes. Hearing not impaired.   NECK: Neck supple, non-tender, symmetric. No jugular venous distension. There is no thyroid enlargement. No palpable lymphadenopathy.   PULM/RESPIRATORY: Lung fields are clear to auscultation bilaterally. There are no wheezes or rales. Normal respiratory effort, not in distress or using accessory muscles to breathe.  CV: There is a regular rhythm. There are no murmurs, gallops or rubs. No edema.    GI/ABDOMEN: The abdomen is soft and non-tender. Normal bowel sounds. There is no palpable hepatosplenomegaly, ascites, or masses.  MUSCULOSKELETAL: Extremities are intact and without trauma.   SKIN: No rashes or petechiae noted to gross inspection. No jaundice. Skin is warm and dry  NEURO: Cranial nerves are grossly intact. There are no focal motor defects. Moves all extremities.   PSYCH: The patient has a normal mood and affect, with a normal judgment and insight. Recent and remote memory intact. Alert and oriented to person, place, and time      Admission Lab/Radiology studies notable for:   MAGNESIUM   ??? Collection Time: 12/09/16  4:40 AM   Result Value Ref Range   ??? Magnesium 2.1 1.6 - 2.6 mg/dL   PHOSPHORUS   ??? Collection Time: 12/09/16  4:40 AM   Result Value Ref Range   ??? Phosphorus 3.7 2.0 - 4.5 MG/DL   TROPONIN-I   ??? Collection Time: 12/09/16  4:40 AM   Result Value Ref Range   ??? Troponin-I 0.01 0.0 - 0.05 NG/ML   PROTIME INR (PT)   ??? Collection Time: 12/09/16  4:40  AM   Result Value Ref Range   ??? INR 1.3 (H) 0.8 - 1.2   BASIC METABOLIC PANEL   ??? Collection Time: 12/09/16  4:40 AM   Result Value Ref Range   ??? Sodium 137 137 - 147 MMOL/L   ??? Potassium 4.1 3.5 - 5.1 MMOL/L   ??? Chloride 104 98 - 110 MMOL/L   ??? CO2 24 21 - 30 MMOL/L   ??? Anion Gap 9 3 - 12   ??? Glucose 95 70 - 100 MG/DL   ??? Blood Urea Nitrogen 17 7 - 25 MG/DL   ??? Creatinine 0.99 0.4 - 1.24 MG/DL   ??? Calcium 9.0 8.5 - 10.6 MG/DL   ??? eGFR Non African American >60 >60 mL/min   ??? eGFR African American >60 >60 mL/min   TROPONIN-I   ??? Collection Time: 12/09/16 11:20 AM   Result Value Ref Range   ??? Troponin-I 0.01 0.0 - 0.05 NG/ML   PTT (APTT)   ??? Collection Time: 12/09/16 11:20 AM   Result Value Ref Range   ??? APTT 29.5 20.0 - 36.0 SEC   CBC   ??? Collection Time: 12/09/16 11:45 AM   Result Value Ref Range   ??? White Blood Cells 6.1 4.5 - 11.0 K/UL   ??? RBC 3.80 (L) 4.4 - 5.5 M/UL   ??? Hemoglobin 11.9 (L) 13.5 - 16.5 GM/DL   ??? Hematocrit 36.5 (L) 40 - 50 %   ??? MCV 96.0 80 - 100 FL   ??? MCH 31.4 26 - 34 PG ??? MCHC 32.7 32.0 - 36.0 G/DL   ??? RDW 15.3 (H) 11 - 15 %   ??? Platelet Count 126 (L) 150 - 400 K/UL   ??? MPV 9.5 7 - 11 FL   ???    Brief Hospital Course:  The patient was admitted and the following issues were addressed during this hospitalization: (with pertinent details).      Patient with h/o CAD s/p PCI x2 to his LAD, Afib s/p PPM placement, RA on immunosuppression, BPH and gout. He is here with chest pain concerning for angina type pain. Somewhat typical. Pressure tightness substernal, lasted initially for 15 minutes few weeks ago, progressed in intensity, lasting about 1 hour. Relieved initially with SL nitro and requiring nitro patch last night. Happens at rest (pt does not have high exercise tolerance due to back issues). He is currently chest pain free but is c/o occipital headache. Last episode was associated with SOA, left arm and neck/jaw pain. He does not recall his symptoms from 2011 when he had his LAD stents to compare it to his current symptoms.  VS and labs reviewed. INR 1.3. Cr normal. Troponin negative. ECG with paced rhythm which makes it difficult to assess for ST changes, but no changes were seen on ECGs from OSH x2. Tele with paced rhythm.???  His TIMI score is 3 (ASA use, h/o CAD, age). His HEART score is 4. Although he has re-assuring troponin and ECGs (which is paced), he still carries a lot of risk factors.  I have discussed the patient's symptoms with Dr Felipe Drone with Cardiology. Patient underwent LHC that showed mild to moderated non-obstructive CAD without high grade stenosis, patent LAD stents with mild in-stent restenosis, and normal LVEDP.  Patient was discharged the next day after ensuring good hemostasis of the cath site. Cont PTA meds. Added Torpol XL to discharge meds. I instructed the patient to take 9 mg of Warfarin tonight and go back  to 6 mg starting tomorrow. Will have him check his INR on Friday and Monday and call results into the cardiology clinic for further adjustments. Condition at Discharge: Stable    Discharge Diagnoses:   Hospital Problems        Active Problems    Chest pain          Surgical Procedures: None    Significant Diagnostic Studies and Procedures: noted in brief hospital course    Consults:  Cardiology    Patient Disposition: Home       Patient instructions/medications:     PROTIME INR (PT)   Standing Status: Future  Standing Exp. Date: 12/10/17   Fax results to Dr Darci Current clinic.     PROTIME INR (PT)   Standing Status: Future  Standing Exp. Date: 12/10/17   Fax results to Dr Darci Current clinic.     Activity as Tolerated   It is important to keep increasing your activity level after you leave the hospital.  Moving around can help prevent blood clots, lung infection (pneumonia) and other problems.  Gradually increasing the number of times you are up moving around will help you return to your normal activity level more quickly.  Continue to increase the number of times you are up to the chair and walking daily to return to your normal activity level. Begin to work toward your normal activity level at discharge     Other Activity Restrictions   No driving for 2 days.  No sexual activity for 1 week.  No strenuous activity for 1 week.  No lifting over 15 lbs for 1 week.  May return to normal activity in 2 days.  May return to work in 2 days unless otherwise instructed depending on what you do.  No tub baths for 1 week. Okay to shower 24 hours after cath.     Report These Signs and Symptoms   Please contact your doctor if you have any of the following symptoms: temperature higher than 100 degrees F, uncontrolled pain, persistent nausea and/or vomiting, difficulty breathing, chest pain, severe abdominal pain, headache, unable to urinate, unable to have bowel movement or drainage with a foul odor     Questions About Your Stay   For questions or concerns regarding your hospital stay. Call [phone]   Discharging attending physician: Melvyn Novas [[id number]] Cardiac Diet   Limiting unhealthy fats and cholesterol is the most important step you can take in reducing your risk for cardiovascular disease.  Unhealthy fats include saturated and trans fats.  Monitor your sodium and cholesterol intake.  Restrict your sodium to 2g (grams) or 2000mg  (milligrams) daily, and your cholesterol to 200mg  daily.    If you have questions regarding your diet at home, you may contact a dietitian at [phone].       Warfarin Information   WARFARIN INFORMATION    Notify Dr. Azzie Roup at discharge that you have an INR due on Friday and Monday. If you are not notified on the day of your INR draw with the result and your dosage instructions, please call your doctor's office to get this information.    Medication regimen:  You will be discharged on warfarin.  Warfarin is a blood thinner medication.  The dose you are currently taking may change based on blood levels/INR upon follow-up.  It is very important to continue taking the medication as prescribed.  Do not change your dose unless instructed by a healthcare professional.  Warfarin requires monitoring of blood  levels/INR on a regular basis. You should tell your healthcare professionals that you take warfarin.    Follow-up:   Follow-up on all scheduled appointments.  Warfarin dosing may change based on your blood level/INR.  If not done already, you will want to schedule an appointment after discharge to follow-up on your blood levels/INR.  You may need to follow-up multiple times during the first several weeks after hospital discharge.      Drug Interactions:  Talk with your healthcare professional prior to starting or stopping any medications.  This includes prescription, over-the-counter, natural supplements, vitamins, and minerals.  Many medications may increase or decrease your blood level/INR.      Dietary Advice:  Certain foods with vitamin K may alter the effects of warfarin.  Green, leafy vegetables (examples: broccoli, spinach, kale) are some vegetables that may change blood levels/INR.  It is important to keep a consistent diet.  Avoid any major changes in diet when taking warfarin.  Please notify a health professional before changing your eating habits.    Adverse Reactions:  The most common reaction seen with warfarin is an increased risk of bleeding.  Bruising may also be a common occurrence while taking this medication.      Reasons to go to the emergency department:  *Falling/hitting your head, with periods of headaches, vision changes, dizziness, loss of   consciousness  *Heavy pressure on your chest, difficulty breathing, shortness of breath.  This may be a sign of a clot in your lungs.  *Blood-tinged vomiting, or what looks like coffee-grounds.  This may be a sign of a stomach bleed.  *Bright red urine.  This may be a sign of a bleed in your bladder.  *Extreme temperature changes, swelling, and pain in your thighs.  This may be a sign of a clot in your legs.     Additional Discharge Instructions   Take 9 mg of Warfarin tonight only, then take 6 mg at night daily starting tomorrow. Check INR on Friday and Monday.        Current Discharge Medication List       START taking these medications    Details   metoprolol XL (TOPROL XL) 25 mg extended release tablet Take one tablet by mouth daily.  Qty: 30 tablet, Refills: 3    PRESCRIPTION TYPE:  Normal          CONTINUE these medications which have been CHANGED or REFILLED    Details   !! warfarin (COUMADIN) 1 mg tablet Take one tablet by mouth daily. Take with 5 mg tab for total of 6 mg daily  Qty: 90 tablet, Refills: 3    PRESCRIPTION TYPE:  Normal      !! warfarin (COUMADIN) 5 mg tablet Take 1 tablet by mouth daily as directed by cardiology for INR result. Take with 1 mg tab for a total of 6 mg daily  Qty: 90 tablet, Refills: 1    PRESCRIPTION TYPE:  Normal       !! - Potential duplicate medications found. Please discuss with provider. CONTINUE these medications which have NOT CHANGED    Details   allopurinol (ZYLOPRIM) 100 mg tablet Take 1 Tab by mouth Daily.    PRESCRIPTION TYPE:  Historical Med      aspirin EC 81 mg PO tablet Take 81 mg by mouth daily.    PRESCRIPTION TYPE:  Historical Med      atorvastatin (LIPITOR) 20 mg tablet TAKE ONE TABLET BY MOUTH ONCE DAILY  Qty: 90 tablet, Refills: 0    PRESCRIPTION TYPE:  Normal  Associated Diagnoses: Mixed hyperlipidemia      finasteride (PROSCAR) 5 mg tablet Take 5 mg by mouth daily.    PRESCRIPTION TYPE:  Historical Med      gabapentin (NEURONTIN) 600 mg tablet Take 600 mg by mouth three times daily.    PRESCRIPTION TYPE:  Historical Med      hydroxychloroquine (PLAQUENIL) 200 mg PO tablet Take 200 mg by mouth twice daily.    PRESCRIPTION TYPE:  Historical Med  Associated Diagnoses: AF (atrial fibrillation) (HCC); Essential hypertension, benign      imiquimod(+) (ALDARA) 5 % topical cream Apply to lower lip once a week.  Qty: 12 Each, Refills: 3    PRESCRIPTION TYPE:  Normal  Associated Diagnoses: Actinic keratosis      ketoconazole (NIZORAL) 2 % topical shampoo Apply topically to affected area of damp scalp, lather, leave on 5 minutes, and rinse. Repeat twice weekly for treatment of dandruff.  Qty: 120 mL, Refills: 11    PRESCRIPTION TYPE:  Normal      nitroglycerin (NITROQUICK) 0.4 mg tablet Place one tablet under tongue every 5 minutes as needed for Chest Pain.  Qty: 25 tablet, Refills: 1    PRESCRIPTION TYPE:  Normal      nortriptyline (PAMELOR) 10 mg capsule Take 10 mg by mouth at bedtime daily.    PRESCRIPTION TYPE:  Historical Med      polyethylene glycol 3350 (MIRALAX) 17 g packet Take 17 g by mouth daily as needed.    PRESCRIPTION TYPE:  Historical Med      prednisone (DELTASONE) 2.5 mg tablet Take 2.5 mg by mouth daily with breakfast.    PRESCRIPTION TYPE:  Historical Med      primidone (MYSOLINE) 50 mg tablet Take 1 tablet by mouth daily.    PRESCRIPTION TYPE:  Historical Med The following medications were removed from your list. This list includes medications discontinued this stay and those removed from your prior med list in our system        tamsulosin (FLOMAX) 0.4 mg capsule               Scheduled appointments:    Jan 22, 2017  8:45 AM CST  Return Patient with Hester Mates, MD  Cardiovascular Medicine at [facility] [place]) [address]  [address]  [place] [address]  [phone]   Feb 02, 2017  9:30 AM CST  Return Patient with Thyra Breed, MD  [facility] Internal [facility]) [facility] [address]  [address]  [place] [address]  [phone]          Pending items needing follow up:   None    More than 30 minutes were spent on today's evaluation and discharge planning.    Signed:  Melvyn Novas, MD  12/14/2016      cc:  Primary Care Physician:  Lona Kettle   Verified  Referring physicians:  [place] , PA-C   Additional provider(s):

## 2016-12-16 ENCOUNTER — Encounter: Admit: 2016-12-16 | Discharge: 2016-12-16 | Payer: MEDICARE

## 2016-12-16 DIAGNOSIS — I480 Paroxysmal atrial fibrillation: Principal | ICD-10-CM

## 2016-12-16 NOTE — Progress Notes
Spoke with pt relative to results, dosage and date of next INR, Order for implementation of Anticoag entered

## 2017-01-02 ENCOUNTER — Encounter: Admit: 2017-01-02 | Discharge: 2017-01-02 | Payer: MEDICARE

## 2017-01-06 ENCOUNTER — Encounter: Admit: 2017-01-06 | Discharge: 2017-01-06 | Payer: MEDICARE

## 2017-01-09 ENCOUNTER — Encounter: Admit: 2017-01-09 | Discharge: 2017-01-09 | Payer: MEDICARE

## 2017-01-16 ENCOUNTER — Encounter: Admit: 2017-01-16 | Discharge: 2017-01-16 | Payer: MEDICARE

## 2017-01-16 MED ORDER — KETOCONAZOLE 2 % TP SHAM
TOPICAL | 11 refills | 30.00000 days | Status: AC
Start: 2017-01-16 — End: 2018-06-07

## 2017-01-21 ENCOUNTER — Encounter: Admit: 2017-01-21 | Discharge: 2017-01-21 | Payer: MEDICARE

## 2017-01-21 DIAGNOSIS — I480 Paroxysmal atrial fibrillation: Principal | ICD-10-CM

## 2017-01-22 ENCOUNTER — Ambulatory Visit: Admit: 2017-01-22 | Discharge: 2017-01-23 | Payer: MEDICARE

## 2017-01-22 ENCOUNTER — Encounter: Admit: 2017-01-22 | Discharge: 2017-01-22 | Payer: MEDICARE

## 2017-01-22 DIAGNOSIS — L853 Xerosis cutis: ICD-10-CM

## 2017-01-22 DIAGNOSIS — L818 Other specified disorders of pigmentation: ICD-10-CM

## 2017-01-22 DIAGNOSIS — I10 Essential (primary) hypertension: ICD-10-CM

## 2017-01-22 DIAGNOSIS — L989 Disorder of the skin and subcutaneous tissue, unspecified: ICD-10-CM

## 2017-01-22 DIAGNOSIS — R972 Elevated prostate specific antigen [PSA]: ICD-10-CM

## 2017-01-22 DIAGNOSIS — I2510 Atherosclerotic heart disease of native coronary artery without angina pectoris: ICD-10-CM

## 2017-01-22 DIAGNOSIS — IMO0002 Squamous cell carcinoma: ICD-10-CM

## 2017-01-22 DIAGNOSIS — M069 Rheumatoid arthritis, unspecified: ICD-10-CM

## 2017-01-22 DIAGNOSIS — B079 Viral wart, unspecified: ICD-10-CM

## 2017-01-22 DIAGNOSIS — K289 Gastrojejunal ulcer, unspecified as acute or chronic, without hemorrhage or perforation: ICD-10-CM

## 2017-01-22 DIAGNOSIS — I480 Paroxysmal atrial fibrillation: ICD-10-CM

## 2017-01-22 DIAGNOSIS — D692 Other nonthrombocytopenic purpura: ICD-10-CM

## 2017-01-22 DIAGNOSIS — K529 Noninfective gastroenteritis and colitis, unspecified: ICD-10-CM

## 2017-01-22 DIAGNOSIS — E785 Hyperlipidemia, unspecified: Principal | ICD-10-CM

## 2017-01-22 DIAGNOSIS — L570 Actinic keratosis: ICD-10-CM

## 2017-01-22 DIAGNOSIS — I8390 Asymptomatic varicose veins of unspecified lower extremity: ICD-10-CM

## 2017-01-22 DIAGNOSIS — I219 Acute myocardial infarction, unspecified: ICD-10-CM

## 2017-01-22 NOTE — Progress Notes
Date of Service: 01/22/2017    Kenneth Robles is a 83 y.o. male.       HPI     Kenneth Robles is followed for coronary artery disease and paroxysmal atrial fibrillation. The patient was hospitalized on December 09, 2016 for chest discomfort.  There was no evidence for an acute coronary syndrome.  Coronary angiography was performed which showed mild to moderate nonobstructive coronary disease without high-grade stenosis.  No coronary intervention was required.  Over the past month Kenneth Robles has felt well with very little nondiagnostic chest discomfort.  This chest discomfort has been occurring chronically over many months.  His chest discomfort is nonexertional and tends to occur when he first lies down in bed at night and not with activity. Kenneth Robles reports no congestive symptoms, palpitations, sensation of sustained forceful heart pounding, or syncope.  I do not believe that he is aware when he has paroxysmal atrial fibrillation.  His exercise tolerance has been stable.??? He states that he can walk 1000 feet at a time, which consists of a trip to the mailbox and back.???He has been performing water aerobics with his wife 3 times a week. ???The patient reports no myalgias, bleeding abnormalities, neurologic motor abnormalities or difficulty with speech .He continues to have chronic back,hip and knee pain for which he sees a rheumatologist and orthopedic surgeon. 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 ???He has received injections in the past with a temporary response.   Kenneth Robles saw Dr. Wallene Huh on 08/13/2015 because R-wave sensing on the right ventricular lead of his pacemaker had decreased. ???No action was required. ???In August 2017, his atorvastatin dose was decreased to 20 mg daily because of myalgias, which he is tolerating without adverse effects.  Mr. Prettyman reports occasional right-sided headaches which she relates to chronic neck disease.  It appears to occur when he turns his head while working on the computer.Kenneth Robles reported orthostasis in late August 2018 and his Imdur was stopped.  This has improved his orthostasis considerably.His Flomax was stopped for similar reasons.  ??????  Kenneth Robles past medical history is noteworthy for permanent pacemaker placement on 01/22/06. On 09/14/06 he underwent atrial fibrillation ablation complicated by perciardial effusion requiring pericardiocentesis. He underwent successful PCI of the mid and proximal LAD with 2 overlapping Xience drug-eluting stents, a 2.5 x 28 and a 3.0 x 15 Xience drug-eluting stent from distal to proximal on 05/23/09. Coronary angiography performed on 01/07/13 revealed ???  1. Moderate ostial LAD stenosis 40 percent to 50 percent, with a haziness in the mid LAD segment. The proximal LAD was widely patent and the FFR of the LAD was 0.84 at maximal hyperemia.  2. Chronic total occlusion of a high obtuse marginal branch with left-to-left collaterals, which was unchanged from the prior study dated 2011.  Normal left ventricular end-diastolic pressures.  Kenneth Robles was seen on 06/08/14 after his pacemaker had reached elective replacement interval and had defaulted to VVI pacing. At that time he had developed dyspnea with exertion. He underwent pacemaker generator replacement on 06/12/14. He tripped over a tool in his garage on 03/10/15 resulting in multiple ecchymosis to his face. He indicates that he was evaluated and that there were no serious injuries. The patient was hospitalized locally on September 03, 2016 for right lower lobe infiltrate/pneumonia was treated initially with Zosyn and then with Augmentin and  albuterol inhaler.  Apparently he had paroxysmal atrial fibrillation at the same time which resolved spontaneously.        Vitals: 01/22/17 0854 01/22/17 0856   BP: 92/56 100/60   Pulse:  72   Weight: 99.8 kg (220 lb)    Height: 1.854 m (6' 1)      Body mass index is 29.03 kg/m???.     Past Medical History  Patient Active Problem List    Diagnosis Date Noted   ??? Dizziness 09/30/2016   ??? Hypotension 09/30/2016   ??? Pacemaker battery depletion 06/12/2014   ??? Rheumatoid arthritis(714.0) 03/09/2014   ??? Left-sided low back pain without sciatica 03/02/2014   ??? Lumbar radiculopathy 03/02/2014   ??? Spondylosis of lumbar region without myelopathy or radiculopathy 03/02/2014   ??? Unstable angina (HCC) 01/06/2013   ??? Unspecified hyperplasia of prostate with urinary obstruction and other lower urinary tract symptoms (LUTS) 09/02/2011   ??? Impotence of organic origin 09/02/2011   ??? Pigmented skin lesions - bilateral ear 11/25/2009     Solar lentigo with actinic purpura       ??? CAD (coronary artery disease) 07/05/2009     Admitted with unstable angina. Cardiac cath on 05/23/2009 with successful PCI with overlapping Xience drug-eluting stents, a 2.5 x 28 and a 2.5 x 15 from distal to proximal, postdilated with a 2.75 and subsequently a 3.0 balloon.    08/29/2015 - Stress Test:  This study is mildly abnormal, indicative of a small mild intensity area of reversible ischemia involving the distal inferior wall.  The appearance is very similar to what was seen in 2012. The left ventricular systolic function is normal. There are no high risk prognostic indicators present.       ??? Chest pain 05/21/2009   ??? Osteoarthritis 05/21/2009   ??? PAF (paroxysmal atrial fibrillation) (HCC) 09/14/2006     Paroxysmal symptomatic atrial fibrillation controlled with Tikosyn. Previous left atrial ablation attempt in August 2008 with Dr. Kathleene Hazel. Unfortunately, it was complicated by cardiac tamponade, but the patient did well subsequently. He was placed on Tikosyn which seems to control his arrhythmia very well.     04/2010. Mr Robles declined restarting Tikosyn because atrial fibrillation wasn't frequent and it was very expensive. Target INR is 2.5.     ??? Hyperlipidemia 09/14/2006     Kenneth Robles has had problems with statin myalgias and side effects from niacin. He declines any future lipid lower therapies.     ??? Cardiac pacemaker in situ 09/14/2006   ??? Sinus bradycardia 01/22/2006   ??? HTN (hypertension) 01/22/2006   ??? CRI (chronic renal insufficiency) 01/22/2006         Review of Systems   Constitution: Positive for weakness, malaise/fatigue and weight gain.   HENT: Positive for congestion and hearing loss.    Eyes: Positive for discharge.   Respiratory: Positive for sputum production.    Endocrine: Positive for cold intolerance.   Hematologic/Lymphatic: Negative.    Skin: Positive for nail changes and unusual hair distribution.   Musculoskeletal: Positive for arthritis, back pain, gout, joint pain, joint swelling, neck pain and stiffness.   Gastrointestinal: Negative.    Genitourinary: Negative.    Psychiatric/Behavioral: Negative.    Allergic/Immunologic: Negative.        Physical Exam  GENERAL: The patient is well developed, well nourished, resting comfortably and in no distress. ???  HEENT: No abnormalities of the visible oro-nasopharynx, conjunctiva or sclera are noted.  NECK: There is no jugular venous  distension. Carotids are palpable and without bruits. There is no thyroid enlargement.  Chest: Lung fields are clear to auscultation. There are no wheezes or crackles. His pacemaker incision in left infraclavicular area looks good erosion or.  CV: There is a regular rhythm. The first and second heart sounds are normal. There are no murmurs, gallops or rubs. His apical heart rate is 72???BPM.  ABD: The abdomen is soft and supple with normal bowel sounds. There is no hepatosplenomegaly, ascites, tenderness, masses or bruits.  Neuro: There are no focal motor defects. Ambulation is normal. Cognitive function appears normal. Ext:???There is no edema or evidence of deep vein thrombosis. Good radial pulses. Good posterior tibial pulses. Decreased right dorsalis pulse but good capillary refill and no ischemic ulcers.  SKIN:???There are no rashes and no cellulitis  PSYCH:???The patient is calm, rationale and oriented.    Cardiovascular Studies  A twelve-lead ECG was obtained on 01/22/2017 and shows an atrial paced rhythm with a heart rate of 72 bpm.  Atypical left bundle branch block is noted.    Echo Doppler 10/03/2016:  1. Normal left ventricular ejection fraction.  Left ventricular cavity mildly dilated with mild concentric hypertrophy.   2. LVEF= 60%.  3. Mild aortic regurgitation. No other valvular abnormalities noted.    4. Right ventricular function normal however size moderately dilated.  5. Pulmonary artery systolic pressure cannot be calculated from this study.   Compaired to previous ECHO on 10/11/2012, no major change noted.    Coronary angiography 12/09/2016:  1. Left main coronary artery:  The left main coronary artery arises normally from left coronary sinus.  Distally, it bifurcates into the left anterior descending artery and left circumflex artery.  It is angiographically free of significant disease.  2. Left anterior descending artery:  The left anterior descending artery appears to be a large-caliber vessel which has previously placed stents extending from the proximal to mid LAD in overlapping fashion.  The stents appear to be patent, with 30% in-stent restenosis in the midportion of the stent.  The ostial LAD has 40% to 50% disease.  The midportion of the LAD has 20% disease just distal to the previously placed stents.  The diagonal vessels appear to be small caliber, without significant disease.  3. Left circumflex artery:  The left circumflex artery appears to be a large-caliber vessel which has 20% disease in the proximal region.  The 1st obtuse marginal branch is known to be chronically totally occluded. It appears to be filling from left-to-left collaterals.  The 2nd and 3rd obtuse marginal branches appear to be free of significant disease.   4. Right coronary artery:  The right coronary artery has an anterior and somewhat low takeoff and arises from the right coronary sinus.  It distally bifurcates into PDA and PLV branches.  It is a large dominant vessel.  The RCA has 20% disease extending from the proximal midportion to the mid distal portion.  The PDA and PLV branches appear to be free of significant disease.  ??????  FINAL IMPRESSION:    1. Mild to moderate nonobstructive coronary artery disease,which appears to be unchanged from prior cath, as described above.  2. Patent LAD stent with mild in-stent restenosis  3. Normal LVEDP.  4. No significant gradient across the aortic valve on pullback.  ???  RECOMMENDATIONS:  The present study appears to be grossly unchanged compared to a prior study in 2014.  Therefore, we will recommend medical management at this time.  Problems Addressed Today  Coronary artery disease.  Paroxysmal atrial fibrillation.  Assessment and Plan     Mr. Delaossa currently reports infrequent nondiagnostic chest discomfort that is not very bothersome to him.  Recent coronary angiography revealed no critical lesions. He reports no congestive symptoms.??? I have asked the patient to carry sublingual nitroglycerin at all times. Regular mild aerobic exercise, weight loss and adherence to a heart healthy diet were recommended. I reviewed with him in detail his pacemaker surveillance schedule. ???I have asked him to obtain remote surveillance of his pacemaker every 3 months with a yearly interrogation in the office. ???I have asked the office staff to make sure this pacemaker surveillance is scheduled as well. His CHA2DS2-VASc score appears to be 3 for age and aortic plaque. The risks and benefits of anticoagulation therapy have been reviewed with the patient. The patient understands that anticoagulation is used to decrease thrombotic or clotting complications associated with atrial fibrillation/flutter, such as stroke and systemic embolization which can be disabling or fatal, but can, on occasion, lead to life-threatening bleeding complications including gastrointestinal and intracranial hemorrhage. The patient wishes to continue with anticoagulation. Anticoagulation options were presented to the patient which included warfarin or one of the newer direct acting oral anticoagulants and the patient wanted to continue warfarin. I have asked the patient to continue working with the cardiovascular nurses so that his INR can be monitored and his dose of warfarin adjusted accordingly. ???I have asked him???to return for follow-up in 3???months.         Current Medications (including today's revisions)  ??? allopurinol (ZYLOPRIM) 100 mg tablet Take 1 Tab by mouth Daily.   ??? aspirin EC 81 mg PO tablet Take 81 mg by mouth daily.   ??? atorvastatin (LIPITOR) 20 mg tablet TAKE ONE TABLET BY MOUTH ONCE DAILY   ??? finasteride (PROSCAR) 5 mg tablet Take 5 mg by mouth daily.   ??? gabapentin (NEURONTIN) 600 mg tablet Take 600 mg by mouth three times daily.   ??? hydroxychloroquine (PLAQUENIL) 200 mg PO tablet Take 200 mg by mouth twice daily.   ??? imiquimod(+) (ALDARA) 5 % topical cream Apply to lower lip once a week.   ??? ketoconazole (NIZORAL) 2 % topical shampoo APPLY TOPICALLY TO AFFECTED AREA OF DAMP SCALP, LATHER LEAVE ON FOR 5 MINUTES, RINSE AND REPEAT TWICE WEEKLY FOR TREATMENT OF DANDRUFF.   ??? metoprolol XL (TOPROL XL) 25 mg extended release tablet Take one tablet by mouth daily.   ??? nitroglycerin (NITROQUICK) 0.4 mg tablet Place one tablet under tongue every 5 minutes as needed for Chest Pain.   ??? nortriptyline (PAMELOR) 10 mg capsule Take 10 mg by mouth at bedtime daily.   ??? polyethylene glycol 3350 (MIRALAX) 17 g packet Take 17 g by mouth daily as needed. ??? prednisone (DELTASONE) 2.5 mg tablet Take 2.5 mg by mouth daily with breakfast.   ??? primidone (MYSOLINE) 50 mg tablet Take 1 tablet by mouth daily.   ??? warfarin (COUMADIN) 1 mg tablet Take one tablet by mouth daily. Take with 5 mg tab for total of 6 mg daily   ??? warfarin (COUMADIN) 5 mg tablet Take 1 tablet by mouth daily as directed by cardiology for INR result. Take with 1 mg tab for a total of 6 mg daily

## 2017-01-29 ENCOUNTER — Encounter: Admit: 2017-01-29 | Discharge: 2017-01-29 | Payer: MEDICARE

## 2017-02-02 ENCOUNTER — Encounter: Admit: 2017-02-02 | Discharge: 2017-02-02 | Payer: MEDICARE

## 2017-02-04 ENCOUNTER — Encounter: Admit: 2017-02-04 | Discharge: 2017-02-04 | Payer: MEDICARE

## 2017-02-04 DIAGNOSIS — I480 Paroxysmal atrial fibrillation: Principal | ICD-10-CM

## 2017-02-16 ENCOUNTER — Encounter: Admit: 2017-02-16 | Discharge: 2017-02-16 | Payer: MEDICARE

## 2017-02-16 DIAGNOSIS — E782 Mixed hyperlipidemia: Principal | ICD-10-CM

## 2017-02-17 MED ORDER — ATORVASTATIN 20 MG PO TAB
ORAL_TABLET | Freq: Every day | 2 refills | Status: AC
Start: 2017-02-17 — End: 2017-10-12

## 2017-02-20 ENCOUNTER — Encounter: Admit: 2017-02-20 | Discharge: 2017-02-20 | Payer: MEDICARE

## 2017-03-05 ENCOUNTER — Ambulatory Visit: Admit: 2017-03-05 | Discharge: 2017-03-06 | Payer: MEDICARE

## 2017-03-05 ENCOUNTER — Encounter: Admit: 2017-03-05 | Discharge: 2017-03-05 | Payer: MEDICARE

## 2017-03-06 ENCOUNTER — Encounter: Admit: 2017-03-06 | Discharge: 2017-03-06 | Payer: MEDICARE

## 2017-03-06 DIAGNOSIS — I495 Sick sinus syndrome: Principal | ICD-10-CM

## 2017-03-06 DIAGNOSIS — Z950 Presence of cardiac pacemaker: ICD-10-CM

## 2017-03-11 ENCOUNTER — Encounter: Admit: 2017-03-11 | Discharge: 2017-03-11 | Payer: MEDICARE

## 2017-03-16 ENCOUNTER — Encounter: Admit: 2017-03-16 | Discharge: 2017-03-16 | Payer: MEDICARE

## 2017-03-17 ENCOUNTER — Encounter: Admit: 2017-03-17 | Discharge: 2017-03-17 | Payer: MEDICARE

## 2017-03-19 ENCOUNTER — Encounter: Admit: 2017-03-19 | Discharge: 2017-03-19 | Payer: MEDICARE

## 2017-03-31 ENCOUNTER — Encounter: Admit: 2017-03-31 | Discharge: 2017-03-31 | Payer: MEDICARE

## 2017-03-31 DIAGNOSIS — I480 Paroxysmal atrial fibrillation: Principal | ICD-10-CM

## 2017-03-31 LAB — PROTIME INR (PT): Lab: 2 g/dL (ref 3.5–5.0)

## 2017-04-17 ENCOUNTER — Encounter: Admit: 2017-04-17 | Discharge: 2017-04-17 | Payer: MEDICARE

## 2017-04-17 MED ORDER — METOPROLOL SUCCINATE 25 MG PO TB24
25 mg | ORAL_TABLET | Freq: Every day | ORAL | 3 refills | 90.00000 days | Status: AC
Start: 2017-04-17 — End: 2017-04-30

## 2017-04-29 ENCOUNTER — Encounter: Admit: 2017-04-29 | Discharge: 2017-04-29 | Payer: MEDICARE

## 2017-04-29 DIAGNOSIS — I480 Paroxysmal atrial fibrillation: Principal | ICD-10-CM

## 2017-04-29 LAB — PROTIME INR (PT): Lab: 1.8

## 2017-04-30 ENCOUNTER — Ambulatory Visit: Admit: 2017-04-30 | Discharge: 2017-05-01 | Payer: MEDICARE

## 2017-04-30 ENCOUNTER — Encounter: Admit: 2017-04-30 | Discharge: 2017-04-30 | Payer: MEDICARE

## 2017-04-30 DIAGNOSIS — B079 Viral wart, unspecified: ICD-10-CM

## 2017-04-30 DIAGNOSIS — I8390 Asymptomatic varicose veins of unspecified lower extremity: ICD-10-CM

## 2017-04-30 DIAGNOSIS — I480 Paroxysmal atrial fibrillation: Principal | ICD-10-CM

## 2017-04-30 DIAGNOSIS — I4892 Unspecified atrial flutter: ICD-10-CM

## 2017-04-30 DIAGNOSIS — I10 Essential (primary) hypertension: ICD-10-CM

## 2017-04-30 DIAGNOSIS — R972 Elevated prostate specific antigen [PSA]: ICD-10-CM

## 2017-04-30 DIAGNOSIS — I2510 Atherosclerotic heart disease of native coronary artery without angina pectoris: ICD-10-CM

## 2017-04-30 DIAGNOSIS — E785 Hyperlipidemia, unspecified: Principal | ICD-10-CM

## 2017-04-30 DIAGNOSIS — I219 Acute myocardial infarction, unspecified: ICD-10-CM

## 2017-04-30 DIAGNOSIS — L570 Actinic keratosis: ICD-10-CM

## 2017-04-30 DIAGNOSIS — IMO0002 Squamous cell carcinoma: ICD-10-CM

## 2017-04-30 DIAGNOSIS — K529 Noninfective gastroenteritis and colitis, unspecified: ICD-10-CM

## 2017-04-30 DIAGNOSIS — M069 Rheumatoid arthritis, unspecified: ICD-10-CM

## 2017-04-30 DIAGNOSIS — L989 Disorder of the skin and subcutaneous tissue, unspecified: ICD-10-CM

## 2017-04-30 DIAGNOSIS — L818 Other specified disorders of pigmentation: ICD-10-CM

## 2017-04-30 DIAGNOSIS — K289 Gastrojejunal ulcer, unspecified as acute or chronic, without hemorrhage or perforation: ICD-10-CM

## 2017-04-30 DIAGNOSIS — Z950 Presence of cardiac pacemaker: ICD-10-CM

## 2017-04-30 DIAGNOSIS — L853 Xerosis cutis: ICD-10-CM

## 2017-04-30 DIAGNOSIS — D692 Other nonthrombocytopenic purpura: ICD-10-CM

## 2017-04-30 MED ORDER — METOPROLOL SUCCINATE 25 MG PO TB24
25 mg | ORAL_TABLET | Freq: Every day | ORAL | 3 refills | 90.00000 days | Status: AC
Start: 2017-04-30 — End: 2018-07-19

## 2017-05-06 ENCOUNTER — Encounter: Admit: 2017-05-06 | Discharge: 2017-05-06 | Payer: MEDICARE

## 2017-05-06 DIAGNOSIS — I480 Paroxysmal atrial fibrillation: Principal | ICD-10-CM

## 2017-05-06 LAB — PROTIME INR (PT): Lab: 1.4

## 2017-05-11 ENCOUNTER — Encounter: Admit: 2017-05-11 | Discharge: 2017-05-11 | Payer: MEDICARE

## 2017-05-11 DIAGNOSIS — I480 Paroxysmal atrial fibrillation: Principal | ICD-10-CM

## 2017-05-11 MED ORDER — WARFARIN 1 MG PO TAB
1 mg | ORAL_TABLET | Freq: Every day | ORAL | 1 refills | 90.00000 days | Status: AC
Start: 2017-05-11 — End: 2017-06-17

## 2017-05-14 ENCOUNTER — Encounter: Admit: 2017-05-14 | Discharge: 2017-05-14 | Payer: MEDICARE

## 2017-05-14 DIAGNOSIS — I480 Paroxysmal atrial fibrillation: Principal | ICD-10-CM

## 2017-05-14 LAB — PROTIME INR (PT): Lab: 1.3

## 2017-05-18 ENCOUNTER — Encounter: Admit: 2017-05-18 | Discharge: 2017-05-18 | Payer: MEDICARE

## 2017-05-18 DIAGNOSIS — I480 Paroxysmal atrial fibrillation: Principal | ICD-10-CM

## 2017-05-18 LAB — PROTIME INR (PT): Lab: 1.6

## 2017-05-22 ENCOUNTER — Encounter: Admit: 2017-05-22 | Discharge: 2017-05-22 | Payer: MEDICARE

## 2017-05-22 DIAGNOSIS — I480 Paroxysmal atrial fibrillation: Principal | ICD-10-CM

## 2017-05-22 LAB — PROTIME INR (PT): Lab: 1.5

## 2017-05-26 ENCOUNTER — Encounter: Admit: 2017-05-26 | Discharge: 2017-05-26 | Payer: MEDICARE

## 2017-05-26 LAB — PROTIME INR (PT): Lab: 1.7

## 2017-06-02 ENCOUNTER — Encounter: Admit: 2017-06-02 | Discharge: 2017-06-02 | Payer: MEDICARE

## 2017-06-02 DIAGNOSIS — I480 Paroxysmal atrial fibrillation: Principal | ICD-10-CM

## 2017-06-02 LAB — PROTIME INR (PT): Lab: 1.6

## 2017-06-04 ENCOUNTER — Encounter: Admit: 2017-06-04 | Discharge: 2017-06-04 | Payer: MEDICARE

## 2017-06-05 ENCOUNTER — Encounter: Admit: 2017-06-05 | Discharge: 2017-06-05 | Payer: MEDICARE

## 2017-06-05 DIAGNOSIS — I480 Paroxysmal atrial fibrillation: Principal | ICD-10-CM

## 2017-06-05 LAB — PROTIME INR (PT): Lab: 2.1

## 2017-06-10 ENCOUNTER — Ambulatory Visit: Admit: 2017-06-10 | Discharge: 2017-06-11 | Payer: MEDICARE

## 2017-06-11 ENCOUNTER — Encounter: Admit: 2017-06-11 | Discharge: 2017-06-11 | Payer: MEDICARE

## 2017-06-11 DIAGNOSIS — Z950 Presence of cardiac pacemaker: ICD-10-CM

## 2017-06-11 DIAGNOSIS — I495 Sick sinus syndrome: Principal | ICD-10-CM

## 2017-06-12 ENCOUNTER — Encounter: Admit: 2017-06-12 | Discharge: 2017-06-12 | Payer: MEDICARE

## 2017-06-12 DIAGNOSIS — I480 Paroxysmal atrial fibrillation: Principal | ICD-10-CM

## 2017-06-12 LAB — PROTIME INR (PT): Lab: 1.6 pg (ref 27.0–33.0)

## 2017-06-15 ENCOUNTER — Encounter: Admit: 2017-06-15 | Discharge: 2017-06-15 | Payer: MEDICARE

## 2017-06-15 DIAGNOSIS — I480 Paroxysmal atrial fibrillation: Principal | ICD-10-CM

## 2017-06-15 LAB — PROTIME INR (PT): Lab: 1.9 mL/min — ABNORMAL LOW (ref >60)

## 2017-06-17 ENCOUNTER — Encounter: Admit: 2017-06-17 | Discharge: 2017-06-17 | Payer: MEDICARE

## 2017-06-17 MED ORDER — WARFARIN 1 MG PO TAB
1 mg | ORAL_TABLET | Freq: Every day | ORAL | 1 refills | 90.00000 days | Status: AC
Start: 2017-06-17 — End: 2018-05-31

## 2017-06-17 MED ORDER — WARFARIN 5 MG PO TAB
ORAL_TABLET | Freq: Every day | ORAL | 1 refills | 90.00000 days | Status: AC
Start: 2017-06-17 — End: 2017-08-13

## 2017-06-23 ENCOUNTER — Encounter: Admit: 2017-06-23 | Discharge: 2017-06-23 | Payer: MEDICARE

## 2017-06-23 DIAGNOSIS — I480 Paroxysmal atrial fibrillation: Principal | ICD-10-CM

## 2017-06-23 LAB — PROTIME INR (PT): Lab: 1.8

## 2017-06-26 ENCOUNTER — Encounter: Admit: 2017-06-26 | Discharge: 2017-06-26 | Payer: MEDICARE

## 2017-06-26 DIAGNOSIS — I480 Paroxysmal atrial fibrillation: Principal | ICD-10-CM

## 2017-06-26 LAB — PROTIME INR (PT): Lab: 1.9 mg/dL — ABNORMAL HIGH (ref 0.70–1.33)

## 2017-07-03 ENCOUNTER — Encounter: Admit: 2017-07-03 | Discharge: 2017-07-03 | Payer: MEDICARE

## 2017-07-03 DIAGNOSIS — I480 Paroxysmal atrial fibrillation: Principal | ICD-10-CM

## 2017-07-06 ENCOUNTER — Encounter: Admit: 2017-07-06 | Discharge: 2017-07-06 | Payer: MEDICARE

## 2017-07-06 DIAGNOSIS — Z7901 Long term (current) use of anticoagulants: ICD-10-CM

## 2017-07-06 DIAGNOSIS — I480 Paroxysmal atrial fibrillation: Principal | ICD-10-CM

## 2017-07-10 ENCOUNTER — Encounter: Admit: 2017-07-10 | Discharge: 2017-07-10 | Payer: MEDICARE

## 2017-07-10 DIAGNOSIS — I480 Paroxysmal atrial fibrillation: Principal | ICD-10-CM

## 2017-07-10 DIAGNOSIS — Z7901 Long term (current) use of anticoagulants: ICD-10-CM

## 2017-07-10 LAB — PROTIME INR (PT): Lab: 2.9 MMOL/L — ABNORMAL HIGH (ref 98–110)

## 2017-07-17 ENCOUNTER — Encounter: Admit: 2017-07-17 | Discharge: 2017-07-17 | Payer: MEDICARE

## 2017-07-17 DIAGNOSIS — I480 Paroxysmal atrial fibrillation: Principal | ICD-10-CM

## 2017-08-03 ENCOUNTER — Encounter: Admit: 2017-08-03 | Discharge: 2017-08-03 | Payer: MEDICARE

## 2017-08-03 LAB — PROTIME INR (PT): Lab: 2.6

## 2017-08-04 ENCOUNTER — Ambulatory Visit: Admit: 2017-08-04 | Discharge: 2017-08-05 | Payer: MEDICARE

## 2017-08-04 DIAGNOSIS — I480 Paroxysmal atrial fibrillation: Principal | ICD-10-CM

## 2017-08-04 DIAGNOSIS — I4892 Unspecified atrial flutter: ICD-10-CM

## 2017-08-04 DIAGNOSIS — Z950 Presence of cardiac pacemaker: ICD-10-CM

## 2017-08-11 ENCOUNTER — Encounter: Admit: 2017-08-11 | Discharge: 2017-08-11 | Payer: MEDICARE

## 2017-08-11 DIAGNOSIS — I480 Paroxysmal atrial fibrillation: Principal | ICD-10-CM

## 2017-08-11 DIAGNOSIS — Z7901 Long term (current) use of anticoagulants: ICD-10-CM

## 2017-08-11 LAB — PROTIME INR (PT): Lab: 3.8

## 2017-08-13 ENCOUNTER — Encounter: Admit: 2017-08-13 | Discharge: 2017-08-13 | Payer: MEDICARE

## 2017-08-13 MED ORDER — WARFARIN 5 MG PO TAB
ORAL_TABLET | Freq: Every day | ORAL | 3 refills | 90.00000 days | Status: AC
Start: 2017-08-13 — End: 2018-11-26

## 2017-08-17 ENCOUNTER — Encounter: Admit: 2017-08-17 | Discharge: 2017-08-17 | Payer: MEDICARE

## 2017-08-17 DIAGNOSIS — I480 Paroxysmal atrial fibrillation: Principal | ICD-10-CM

## 2017-08-17 DIAGNOSIS — Z7901 Long term (current) use of anticoagulants: ICD-10-CM

## 2017-08-17 LAB — PROTIME INR (PT): Lab: 3.3

## 2017-08-24 ENCOUNTER — Encounter: Admit: 2017-08-24 | Discharge: 2017-08-24 | Payer: MEDICARE

## 2017-08-24 DIAGNOSIS — I480 Paroxysmal atrial fibrillation: Principal | ICD-10-CM

## 2017-08-24 DIAGNOSIS — Z7901 Long term (current) use of anticoagulants: ICD-10-CM

## 2017-08-24 LAB — PROTIME INR (PT): Lab: 1.9 % (ref 36–45)

## 2017-08-31 ENCOUNTER — Encounter: Admit: 2017-08-31 | Discharge: 2017-08-31 | Payer: MEDICARE

## 2017-08-31 DIAGNOSIS — Z7901 Long term (current) use of anticoagulants: ICD-10-CM

## 2017-08-31 DIAGNOSIS — I480 Paroxysmal atrial fibrillation: Principal | ICD-10-CM

## 2017-08-31 LAB — PROTIME INR (PT): Lab: 2.2

## 2017-09-09 ENCOUNTER — Encounter: Admit: 2017-09-09 | Discharge: 2017-09-09 | Payer: MEDICARE

## 2017-09-09 ENCOUNTER — Ambulatory Visit: Admit: 2017-09-09 | Discharge: 2017-09-10 | Payer: MEDICARE

## 2017-09-10 DIAGNOSIS — I495 Sick sinus syndrome: Principal | ICD-10-CM

## 2017-09-10 DIAGNOSIS — Z950 Presence of cardiac pacemaker: Secondary | ICD-10-CM

## 2017-09-14 ENCOUNTER — Encounter: Admit: 2017-09-14 | Discharge: 2017-09-14 | Payer: MEDICARE

## 2017-09-14 DIAGNOSIS — I480 Paroxysmal atrial fibrillation: Principal | ICD-10-CM

## 2017-09-14 DIAGNOSIS — Z7901 Long term (current) use of anticoagulants: ICD-10-CM

## 2017-09-14 LAB — PROTIME INR (PT): Lab: 1.6

## 2017-09-21 ENCOUNTER — Encounter: Admit: 2017-09-21 | Discharge: 2017-09-21 | Payer: MEDICARE

## 2017-09-21 DIAGNOSIS — I480 Paroxysmal atrial fibrillation: Principal | ICD-10-CM

## 2017-09-21 DIAGNOSIS — Z7901 Long term (current) use of anticoagulants: ICD-10-CM

## 2017-09-21 LAB — PROTIME INR (PT): Lab: 2.3

## 2017-10-11 ENCOUNTER — Encounter: Admit: 2017-10-11 | Discharge: 2017-10-11 | Payer: MEDICARE

## 2017-10-11 DIAGNOSIS — E782 Mixed hyperlipidemia: Principal | ICD-10-CM

## 2017-10-12 MED ORDER — ATORVASTATIN 20 MG PO TAB
ORAL_TABLET | Freq: Every day | 1 refills | Status: AC
Start: 2017-10-12 — End: 2018-05-31

## 2017-10-19 ENCOUNTER — Encounter: Admit: 2017-10-19 | Discharge: 2017-10-19 | Payer: MEDICARE

## 2017-10-19 DIAGNOSIS — Z7901 Long term (current) use of anticoagulants: ICD-10-CM

## 2017-10-19 DIAGNOSIS — I480 Paroxysmal atrial fibrillation: Principal | ICD-10-CM

## 2017-10-19 LAB — PROTIME INR (PT): Lab: 2.4

## 2017-11-02 ENCOUNTER — Encounter: Admit: 2017-11-02 | Discharge: 2017-11-02 | Payer: MEDICARE

## 2017-11-02 DIAGNOSIS — I480 Paroxysmal atrial fibrillation: Principal | ICD-10-CM

## 2017-11-04 ENCOUNTER — Encounter: Admit: 2017-11-04 | Discharge: 2017-11-04 | Payer: MEDICARE

## 2017-11-04 DIAGNOSIS — I480 Paroxysmal atrial fibrillation: Principal | ICD-10-CM

## 2017-11-04 DIAGNOSIS — Z7901 Long term (current) use of anticoagulants: ICD-10-CM

## 2017-11-04 LAB — PROTIME INR (PT): Lab: 2.7

## 2017-11-11 ENCOUNTER — Encounter: Admit: 2017-11-11 | Discharge: 2017-11-11 | Payer: MEDICARE

## 2017-11-11 DIAGNOSIS — Z7901 Long term (current) use of anticoagulants: ICD-10-CM

## 2017-11-11 DIAGNOSIS — I480 Paroxysmal atrial fibrillation: Principal | ICD-10-CM

## 2017-11-11 LAB — PROTIME INR (PT): Lab: 3.7

## 2017-11-17 ENCOUNTER — Encounter: Admit: 2017-11-17 | Discharge: 2017-11-17 | Payer: MEDICARE

## 2017-11-17 DIAGNOSIS — E782 Mixed hyperlipidemia: Principal | ICD-10-CM

## 2017-11-17 DIAGNOSIS — I2510 Atherosclerotic heart disease of native coronary artery without angina pectoris: ICD-10-CM

## 2017-11-20 LAB — LIPID PROFILE
Lab: 118 — ABNORMAL LOW (ref 150–200)
Lab: 20
Lab: 3
Lab: 45
Lab: 58
Lab: 99

## 2017-11-20 LAB — ALT (SGPT): Lab: 12

## 2017-11-24 ENCOUNTER — Encounter: Admit: 2017-11-24 | Discharge: 2017-11-24 | Payer: MEDICARE

## 2017-11-27 ENCOUNTER — Encounter: Admit: 2017-11-27 | Discharge: 2017-11-27 | Payer: MEDICARE

## 2017-11-27 DIAGNOSIS — Z7901 Long term (current) use of anticoagulants: ICD-10-CM

## 2017-11-27 DIAGNOSIS — I480 Paroxysmal atrial fibrillation: Principal | ICD-10-CM

## 2017-11-27 LAB — PROTIME INR (PT): Lab: 3.4

## 2017-12-04 ENCOUNTER — Encounter: Admit: 2017-12-04 | Discharge: 2017-12-04 | Payer: MEDICARE

## 2017-12-07 ENCOUNTER — Encounter: Admit: 2017-12-07 | Discharge: 2017-12-07 | Payer: MEDICARE

## 2017-12-09 ENCOUNTER — Encounter: Admit: 2017-12-09 | Discharge: 2017-12-09 | Payer: MEDICARE

## 2017-12-09 ENCOUNTER — Ambulatory Visit: Admit: 2017-12-09 | Discharge: 2017-12-10 | Payer: MEDICARE

## 2017-12-09 DIAGNOSIS — I480 Paroxysmal atrial fibrillation: Principal | ICD-10-CM

## 2017-12-09 DIAGNOSIS — Z950 Presence of cardiac pacemaker: ICD-10-CM

## 2017-12-10 DIAGNOSIS — I480 Paroxysmal atrial fibrillation: Principal | ICD-10-CM

## 2017-12-10 DIAGNOSIS — Z950 Presence of cardiac pacemaker: ICD-10-CM

## 2017-12-11 ENCOUNTER — Encounter: Admit: 2017-12-11 | Discharge: 2017-12-11 | Payer: MEDICARE

## 2017-12-11 DIAGNOSIS — Z7901 Long term (current) use of anticoagulants: ICD-10-CM

## 2017-12-11 DIAGNOSIS — I480 Paroxysmal atrial fibrillation: Principal | ICD-10-CM

## 2017-12-11 LAB — PROTIME INR (PT): Lab: 2.7

## 2017-12-18 ENCOUNTER — Encounter: Admit: 2017-12-18 | Discharge: 2017-12-18 | Payer: MEDICARE

## 2017-12-18 DIAGNOSIS — I480 Paroxysmal atrial fibrillation: Principal | ICD-10-CM

## 2017-12-18 DIAGNOSIS — Z7901 Long term (current) use of anticoagulants: ICD-10-CM

## 2017-12-18 LAB — PROTIME INR (PT): Lab: 2.1 K/UL (ref 0–0.20)

## 2018-01-01 ENCOUNTER — Encounter: Admit: 2018-01-01 | Discharge: 2018-01-01 | Payer: MEDICARE

## 2018-01-01 DIAGNOSIS — I480 Paroxysmal atrial fibrillation: Principal | ICD-10-CM

## 2018-01-15 ENCOUNTER — Encounter: Admit: 2018-01-15 | Discharge: 2018-01-15 | Payer: MEDICARE

## 2018-01-22 ENCOUNTER — Encounter: Admit: 2018-01-22 | Discharge: 2018-01-22 | Payer: MEDICARE

## 2018-01-22 LAB — PROTIME INR (PT): Lab: 2.7

## 2018-01-26 ENCOUNTER — Ambulatory Visit: Admit: 2018-01-26 | Discharge: 2018-01-26 | Payer: MEDICARE

## 2018-01-26 DIAGNOSIS — I2510 Atherosclerotic heart disease of native coronary artery without angina pectoris: Principal | ICD-10-CM

## 2018-01-26 DIAGNOSIS — I480 Paroxysmal atrial fibrillation: ICD-10-CM

## 2018-01-29 ENCOUNTER — Encounter: Admit: 2018-01-29 | Discharge: 2018-01-29 | Payer: MEDICARE

## 2018-01-29 DIAGNOSIS — I2510 Atherosclerotic heart disease of native coronary artery without angina pectoris: Secondary | ICD-10-CM

## 2018-01-29 DIAGNOSIS — E782 Mixed hyperlipidemia: Secondary | ICD-10-CM

## 2018-02-15 ENCOUNTER — Encounter: Admit: 2018-02-15 | Discharge: 2018-02-15 | Payer: MEDICARE

## 2018-02-15 DIAGNOSIS — L570 Actinic keratosis: Secondary | ICD-10-CM

## 2018-02-16 MED ORDER — IMIQUIMOD 5 % TP CRPK
0 refills | Status: AC
Start: 2018-02-16 — End: 2019-01-11

## 2018-02-25 ENCOUNTER — Encounter: Admit: 2018-02-25 | Discharge: 2018-02-25 | Payer: MEDICARE

## 2018-02-25 DIAGNOSIS — I480 Paroxysmal atrial fibrillation: Secondary | ICD-10-CM

## 2018-02-25 DIAGNOSIS — Z7901 Long term (current) use of anticoagulants: Secondary | ICD-10-CM

## 2018-02-25 LAB — PROTIME INR (PT): Lab: 2.3

## 2018-03-11 ENCOUNTER — Encounter: Admit: 2018-03-11 | Discharge: 2018-03-11 | Payer: MEDICARE

## 2018-03-11 ENCOUNTER — Ambulatory Visit: Admit: 2018-03-11 | Discharge: 2018-03-11 | Payer: MEDICARE

## 2018-03-11 DIAGNOSIS — Q438 Other specified congenital malformations of intestine: ICD-10-CM

## 2018-03-11 DIAGNOSIS — R1084 Generalized abdominal pain: Principal | ICD-10-CM

## 2018-03-11 DIAGNOSIS — IMO0002 Squamous cell carcinoma: Secondary | ICD-10-CM

## 2018-03-11 DIAGNOSIS — D692 Other nonthrombocytopenic purpura: ICD-10-CM

## 2018-03-11 DIAGNOSIS — I2510 Atherosclerotic heart disease of native coronary artery without angina pectoris: ICD-10-CM

## 2018-03-11 DIAGNOSIS — L853 Xerosis cutis: ICD-10-CM

## 2018-03-11 DIAGNOSIS — Z7901 Long term (current) use of anticoagulants: ICD-10-CM

## 2018-03-11 DIAGNOSIS — B079 Viral wart, unspecified: ICD-10-CM

## 2018-03-11 DIAGNOSIS — I219 Acute myocardial infarction, unspecified: ICD-10-CM

## 2018-03-11 DIAGNOSIS — R197 Diarrhea, unspecified: ICD-10-CM

## 2018-03-11 DIAGNOSIS — K289 Gastrojejunal ulcer, unspecified as acute or chronic, without hemorrhage or perforation: ICD-10-CM

## 2018-03-11 DIAGNOSIS — L570 Actinic keratosis: ICD-10-CM

## 2018-03-11 DIAGNOSIS — L989 Disorder of the skin and subcutaneous tissue, unspecified: ICD-10-CM

## 2018-03-11 DIAGNOSIS — I8390 Asymptomatic varicose veins of unspecified lower extremity: ICD-10-CM

## 2018-03-11 DIAGNOSIS — K582 Mixed irritable bowel syndrome: ICD-10-CM

## 2018-03-11 DIAGNOSIS — I480 Paroxysmal atrial fibrillation: ICD-10-CM

## 2018-03-11 DIAGNOSIS — K529 Noninfective gastroenteritis and colitis, unspecified: ICD-10-CM

## 2018-03-11 DIAGNOSIS — M069 Rheumatoid arthritis, unspecified: ICD-10-CM

## 2018-03-11 DIAGNOSIS — I10 Essential (primary) hypertension: ICD-10-CM

## 2018-03-11 DIAGNOSIS — E785 Hyperlipidemia, unspecified: Principal | ICD-10-CM

## 2018-03-11 DIAGNOSIS — L818 Other specified disorders of pigmentation: ICD-10-CM

## 2018-03-11 DIAGNOSIS — R972 Elevated prostate specific antigen [PSA]: ICD-10-CM

## 2018-03-11 LAB — LIPASE: Lab: 32 U/L (ref 11–82)

## 2018-03-11 LAB — CBC AND DIFF
Lab: 0.1 10*3/uL (ref 0–0.20)
Lab: 0.1 10*3/uL (ref 0–0.45)
Lab: 0.9 10*3/uL — ABNORMAL HIGH (ref 0–0.80)
Lab: 1 % (ref >60)
Lab: 1 % (ref >60)
Lab: 1.5 10*3/uL (ref 1.0–4.8)
Lab: 10 % (ref 4–12)
Lab: 15 % — ABNORMAL HIGH (ref 11–15)
Lab: 17 % — ABNORMAL LOW (ref 24–44)
Lab: 173 10*3/uL (ref 150–400)
Lab: 31 pg (ref 26–34)
Lab: 33 g/dL (ref 32.0–36.0)
Lab: 38 % — ABNORMAL LOW (ref 40–50)
Lab: 4.1 M/UL — ABNORMAL LOW (ref 4.4–5.5)
Lab: 6.4 10*3/uL (ref 1.8–7.0)
Lab: 71 % — ABNORMAL HIGH (ref 41–77)
Lab: 8.9 10*3/uL (ref 4.5–11.0)
Lab: 9.5 FL (ref 7–11)
Lab: 93 FL (ref 80–100)

## 2018-03-11 LAB — COMPREHENSIVE METABOLIC PANEL
Lab: 141 MMOL/L (ref 137–147)
Lab: 4.1 MMOL/L (ref 3.5–5.1)

## 2018-03-11 LAB — PROTIME INR (PT): Lab: 1.5 g/dL — ABNORMAL HIGH (ref 0.8–1.2)

## 2018-03-11 LAB — TSH WITH FREE T4 REFLEX: Lab: 1.7 uU/mL (ref 0.35–5.00)

## 2018-03-11 LAB — CELIAC SCREEN

## 2018-03-11 MED ORDER — SODIUM CHLORIDE 0.9 % IV SOLP
INTRAVENOUS | 0 refills | Status: CN
Start: 2018-03-11 — End: ?

## 2018-03-11 MED ORDER — PEG-ELECTROLYTE SOLN 420 GRAM PO SOLR
0 refills | Status: SS
Start: 2018-03-11 — End: 2018-04-20

## 2018-03-11 MED ORDER — HYOSCYAMINE SULFATE 0.125 MG SL SUBL
125 ug | ORAL_TABLET | SUBLINGUAL | 3 refills | Status: AC | PRN
Start: 2018-03-11 — End: 2018-06-28

## 2018-03-11 NOTE — Telephone Encounter
Pt asking to hold warfarin 5 days prior to colonoscopy.  Discussed with [facility], okay to hold if pt is aware of the risks of holding.  Pt is aware of risks.  LM for pt advising him of S[facility] recommendations.

## 2018-03-11 NOTE — Progress Notes
stenoses.  His primary care physician has given him dicyclomine to take up to 3 times a day.  The patient believes it does help some.  He denies any fevers or chills.    I reviewed approximately 60 pages of outside medical records.  Pertinent results are summarized below.  Office notes reviewed.  Patient with complaints of diarrhea and constipation.  Dicyclomine was prescribed to take before meals.  Patient had EGD and colonoscopy at [facility] hospital in 2011.  Polyp was removed and 5-year follow-up was recommended at that time.  EGD showed no evidence of cancer or H. pylori.  Appendix and terminal terminal ileum and right colon was normal in 2011.  EGD again was consistent with prior fundoplication and some antral erosions were noted.  Duodenal biopsies were normal.  Gastric biopsies normal without H. pylori.  Random colon biopsies normal.  Ascending colon polyp showed a tubular adenoma.  Focal hyperplastic change.  Patient had cholecystectomy and inguinal hernia repair and fundoplication.  White count 12.5 hemoglobin 12.9 platelets 279 AST 18 AST 22 alk phos 84 protein 6.3 albumin 3.1       Review of Systems   Constitutional: Positive for fatigue.   HENT: Positive for congestion, ear pain, hearing loss, postnasal drip, rhinorrhea, sore throat and tinnitus.    Eyes: Positive for discharge, redness, itching and visual disturbance.   Respiratory: Positive for chest tightness and shortness of breath.    Cardiovascular: Positive for chest pain.   Gastrointestinal: Positive for abdominal pain, constipation, diarrhea, rectal pain and vomiting.   Endocrine: Negative.    Genitourinary: Positive for enuresis, flank pain and urgency.   Musculoskeletal: Positive for arthralgias, back pain, joint swelling, myalgias, neck pain and neck stiffness.   Skin: Negative.    Allergic/Immunologic: Negative.    Neurological: Positive for dizziness, tremors, weakness, light-headedness and numbness.   Hematological: Bruises/bleeds easily. Psychiatric/Behavioral: The patient is nervous/anxious.    A complete review of systems was obtained from the patient and all other systems were reviewed and negative.    Objective:         ??? allopurinol (ZYLOPRIM) 100 mg tablet Take 1 Tab by mouth Daily.   ??? aspirin EC 81 mg PO tablet Take 81 mg by mouth daily.   ??? atorvastatin (LIPITOR) 20 mg tablet TAKE 1 TABLET BY MOUTH ONCE DAILY   ??? dicyclomine (BENTYL) 10 mg capsule Take 10 mg by mouth three times daily.   ??? finasteride (PROSCAR) 5 mg tablet Take 5 mg by mouth daily.   ??? gabapentin (NEURONTIN) 600 mg tablet Take 600 mg by mouth three times daily.   ??? hydroxychloroquine (PLAQUENIL) 200 mg PO tablet Take 200 mg by mouth twice daily.   ??? imiquimod (ALDARA) 5 % topical cream APPLY CREAM TO AFFECTED AREA OF LOWER LIP ONCE A WEEK.   ??? ketoconazole (NIZORAL) 2 % topical shampoo APPLY TOPICALLY TO AFFECTED AREA OF DAMP SCALP, LATHER LEAVE ON FOR 5 MINUTES, RINSE AND REPEAT TWICE WEEKLY FOR TREATMENT OF DANDRUFF.   ??? metoprolol XL (TOPROL XL) 25 mg extended release tablet Take one tablet by mouth daily.   ??? nitroglycerin (NITROQUICK) 0.4 mg tablet Place one tablet under tongue every 5 minutes as needed for Chest Pain.   ??? polyethylene glycol 3350 (MIRALAX) 17 g packet Take 17 g by mouth daily as needed.   ??? prednisone (DELTASONE) 2.5 mg tablet Take 2.5 mg by mouth daily with breakfast.   ??? primidone (MYSOLINE) 50 mg tablet Take 1 tablet  by mouth daily.   ??? tamsulosin (FLOMAX) 0.4 mg capsule Take 1 capsule by mouth daily.   ??? warfarin (COUMADIN) 1 mg tablet Take one tablet by mouth daily. Take 1 to 3  tabs daily with 5 mg tab or as directed   ??? warfarin (COUMADIN) 5 mg tablet Take 1-2 tablets by mouth daily as directed by cardiology for INR result.     Vitals:    03/11/18 1014   BP: 114/56   BP Source: Arm, Left Upper   Patient Position: Sitting   Pulse: 86   Weight: 96.2 kg (212 lb)   Height: 185.4 cm (72.99)   PainSc: Zero     Body mass index is 27.98 kg/m???. -     COMPREHENSIVE METABOLIC PANEL; Future; Expected date: 03/11/2018  -     LIPASE; Future; Expected date: 03/11/2018  -     CELIAC SCREEN; Future; Expected date: 03/11/2018  -     hyoscyamine sulfate (LEVSIN/SL) 0.125 mg sublingual tablet; Place one tablet under tongue every 4 hours as needed for Cramps or Diarrhea.  -     peg-electrolyte solution (NULYTELY) 420 gram oral solution; Mix as directed on package. Drink (8oz) every 10 minutes until gone. Refrigerate once mixed.  -     CT ABD/PELV W CONTRAST; Future; Expected date: 03/11/2018    Diarrhea, unspecified type  -     TSH WITH FREE T4 REFLEX; Future; Expected date: 03/11/2018    Patient Instructions:   1. Add fibercon 2 tablets daily  2. Hyoscyamine as needed for spells of pain, diarrhea  3. CT scan abdomen/pelvis  4. Schedule EGD and colonoscopy - you need to coordinate coumadin management with Dr. Arna Medici   5. Labs

## 2018-03-12 ENCOUNTER — Encounter: Admit: 2018-03-12 | Discharge: 2018-03-12 | Payer: MEDICARE

## 2018-03-12 DIAGNOSIS — R1084 Generalized abdominal pain: Principal | ICD-10-CM

## 2018-03-16 ENCOUNTER — Encounter: Admit: 2018-03-16 | Discharge: 2018-03-16 | Payer: MEDICARE

## 2018-03-16 DIAGNOSIS — I480 Paroxysmal atrial fibrillation: Principal | ICD-10-CM

## 2018-03-16 DIAGNOSIS — Z7901 Long term (current) use of anticoagulants: ICD-10-CM

## 2018-03-16 LAB — PROTIME INR (PT): Lab: 2.3 — ABNORMAL HIGH (ref 6–28)

## 2018-03-16 NOTE — Telephone Encounter
Patient asking if our office had heard from [facility] if OK to hold coumadin.  Review of chart, [facility] has made a note 03/11/2018 regarding coumadin and asked for patient to call their office to discuss.  Patient will phone [facility] and call our office for any further questions.

## 2018-03-16 NOTE — Telephone Encounter
Pt. planning on having EGD, and colonoscopy on March 24th with Dr. Hale Drone at [facility] They are requesting Pt. to hold Warfarin for 5 days. Pt. called [place] to get our recommendation for hold times and to inquire if he needs lovenox bridging. Will forward to Dr. Arna Medici for recommendations.

## 2018-03-16 NOTE — Telephone Encounter
Left message to call back to discuss holding warfarin and risk involved.

## 2018-03-22 ENCOUNTER — Ambulatory Visit: Admit: 2018-03-22 | Discharge: 2018-03-22 | Payer: MEDICARE

## 2018-03-22 ENCOUNTER — Encounter: Admit: 2018-03-22 | Discharge: 2018-03-22 | Payer: MEDICARE

## 2018-03-22 DIAGNOSIS — Z950 Presence of cardiac pacemaker: Secondary | ICD-10-CM

## 2018-03-22 DIAGNOSIS — I480 Paroxysmal atrial fibrillation: ICD-10-CM

## 2018-03-22 DIAGNOSIS — I495 Sick sinus syndrome: Principal | ICD-10-CM

## 2018-03-22 DIAGNOSIS — R001 Bradycardia, unspecified: ICD-10-CM

## 2018-03-22 DIAGNOSIS — I10 Essential (primary) hypertension: Principal | ICD-10-CM

## 2018-03-23 ENCOUNTER — Encounter: Admit: 2018-03-23 | Discharge: 2018-03-23 | Payer: MEDICARE

## 2018-03-23 DIAGNOSIS — Z7901 Long term (current) use of anticoagulants: ICD-10-CM

## 2018-03-23 DIAGNOSIS — I480 Paroxysmal atrial fibrillation: Principal | ICD-10-CM

## 2018-03-23 LAB — PROTIME INR (PT): Lab: 2.4

## 2018-03-25 ENCOUNTER — Encounter: Admit: 2018-03-25 | Discharge: 2018-03-25 | Payer: MEDICARE

## 2018-03-25 ENCOUNTER — Ambulatory Visit: Admit: 2018-03-25 | Discharge: 2018-03-25 | Payer: MEDICARE

## 2018-03-25 DIAGNOSIS — R1084 Generalized abdominal pain: Principal | ICD-10-CM

## 2018-03-25 MED ORDER — SODIUM CHLORIDE 0.9 % IJ SOLN
50 mL | Freq: Once | INTRAVENOUS | 0 refills | Status: CP
Start: 2018-03-25 — End: ?
  Administered 2018-03-25: 16:00:00 50 mL via INTRAVENOUS

## 2018-03-25 MED ORDER — IOHEXOL 350 MG IODINE/ML IV SOLN
100 mL | Freq: Once | INTRAVENOUS | 0 refills | Status: CP
Start: 2018-03-25 — End: ?
  Administered 2018-03-25: 16:00:00 100 mL via INTRAVENOUS

## 2018-04-02 ENCOUNTER — Encounter: Admit: 2018-04-02 | Discharge: 2018-04-02 | Payer: MEDICARE

## 2018-04-02 NOTE — Telephone Encounter
Patient aware of results reviewed by Dr. Janene Madeira and aware results sent to Dr. Adele Schilder office for continuance of care.  Myrlene Broker RN, GI Clinical Nurse Coordinator

## 2018-04-05 ENCOUNTER — Encounter: Admit: 2018-04-05 | Discharge: 2018-04-05 | Payer: MEDICARE

## 2018-04-05 NOTE — Telephone Encounter
Review of prep instructions.  Patient confirms following with Cardiology regarding warfarin holding and restarting directions.  No further questions at this time.

## 2018-04-06 ENCOUNTER — Encounter: Admit: 2018-04-06 | Discharge: 2018-04-06 | Payer: MEDICARE

## 2018-04-06 NOTE — Telephone Encounter
04/06/2018 12:16 PM   Spoke in detail with pt about Dr. Gwinda Passe recommendations, as well as risk vs benefits. Pt verbalized understanding and plan is in place for post op warfarin management. Mychart message sent to pt as a written reminder of instructions as well as to share with his wife who will be assisting him that day.   No additional needs.

## 2018-04-09 ENCOUNTER — Encounter: Admit: 2018-04-09 | Discharge: 2018-04-09 | Payer: MEDICARE

## 2018-04-09 NOTE — Telephone Encounter
04/08/2018 @ 1641 Hr:  Pt LVM,  Repeat message pertaining to GI procedures    -See 04/05/2018 Telephone note    04/09/2018 @ 0945 Hr:  LVM,  Reiterated prep instructions / pt instructed to call Endo scheduling( # provided) for scheduling questions or rescheduling.

## 2018-04-13 ENCOUNTER — Encounter: Admit: 2018-04-13 | Discharge: 2018-04-13 | Payer: MEDICARE

## 2018-04-13 DIAGNOSIS — Z7901 Long term (current) use of anticoagulants: ICD-10-CM

## 2018-04-13 DIAGNOSIS — I480 Paroxysmal atrial fibrillation: Principal | ICD-10-CM

## 2018-04-13 LAB — PROTIME INR (PT): Lab: 1.9

## 2018-04-14 ENCOUNTER — Encounter: Admit: 2018-04-14 | Discharge: 2018-04-14 | Payer: MEDICARE

## 2018-04-14 NOTE — Telephone Encounter
Patient electing to keep endoscopy as scheduled.

## 2018-04-14 NOTE — Telephone Encounter
Patient message asking if endoscopy is still on for next week due to the covid-19 concern as will need to start holding blood thinner in preparation.  Routing to Dr. Janene Madeira.

## 2018-04-19 ENCOUNTER — Encounter: Admit: 2018-04-19 | Discharge: 2018-04-19 | Payer: MEDICARE

## 2018-04-20 ENCOUNTER — Ambulatory Visit: Admit: 2018-04-20 | Discharge: 2018-04-20 | Payer: MEDICARE

## 2018-04-20 ENCOUNTER — Encounter: Admit: 2018-04-20 | Discharge: 2018-04-20 | Payer: MEDICARE

## 2018-04-20 ENCOUNTER — Ambulatory Visit: Admit: 2018-04-20 | Discharge: 2018-04-21 | Payer: MEDICARE

## 2018-04-20 DIAGNOSIS — Z888 Allergy status to other drugs, medicaments and biological substances status: ICD-10-CM

## 2018-04-20 DIAGNOSIS — M069 Rheumatoid arthritis, unspecified: ICD-10-CM

## 2018-04-20 DIAGNOSIS — Z9889 Other specified postprocedural states: ICD-10-CM

## 2018-04-20 DIAGNOSIS — R1084 Generalized abdominal pain: Principal | ICD-10-CM

## 2018-04-20 DIAGNOSIS — I10 Essential (primary) hypertension: ICD-10-CM

## 2018-04-20 DIAGNOSIS — R197 Diarrhea, unspecified: ICD-10-CM

## 2018-04-20 DIAGNOSIS — I2510 Atherosclerotic heart disease of native coronary artery without angina pectoris: ICD-10-CM

## 2018-04-20 DIAGNOSIS — E785 Hyperlipidemia, unspecified: ICD-10-CM

## 2018-04-20 DIAGNOSIS — K6389 Other specified diseases of intestine: ICD-10-CM

## 2018-04-20 DIAGNOSIS — K644 Residual hemorrhoidal skin tags: ICD-10-CM

## 2018-04-20 DIAGNOSIS — I480 Paroxysmal atrial fibrillation: Principal | ICD-10-CM

## 2018-04-20 DIAGNOSIS — Z86010 Personal history of colonic polyps: ICD-10-CM

## 2018-04-20 DIAGNOSIS — I252 Old myocardial infarction: ICD-10-CM

## 2018-04-20 DIAGNOSIS — Z7901 Long term (current) use of anticoagulants: ICD-10-CM

## 2018-04-20 LAB — PROTIME INR (PT): Lab: 1.2

## 2018-04-20 MED ORDER — LIDOCAINE (PF) 200 MG/10 ML (2 %) IJ SYRG
0 refills | Status: DC
Start: 2018-04-20 — End: 2018-04-20
  Administered 2018-04-20: 14:00:00 80 mg via INTRAVENOUS

## 2018-04-20 MED ORDER — LACTATED RINGERS IV SOLP
1000 mL | Freq: Once | INTRAVENOUS | 0 refills | Status: CP
Start: 2018-04-20 — End: ?
  Administered 2018-04-20: 13:00:00 1000 mL via INTRAVENOUS

## 2018-04-20 MED ORDER — LACTATED RINGERS IV SOLP
0 refills | Status: DC
Start: 2018-04-20 — End: 2018-04-20
  Administered 2018-04-20: 14:00:00 via INTRAVENOUS

## 2018-04-20 MED ORDER — PROPOFOL INJ 10 MG/ML IV VIAL
0 refills | Status: DC
Start: 2018-04-20 — End: 2018-04-20
  Administered 2018-04-20: 14:00:00 50 mg via INTRAVENOUS

## 2018-04-20 MED ORDER — PROPOFOL 10 MG/ML IV EMUL 20 ML (INFUSION)(AM)(OR)
INTRAVENOUS | 0 refills | Status: DC
Start: 2018-04-20 — End: 2018-04-20
  Administered 2018-04-20: 14:00:00 120 ug/kg/min via INTRAVENOUS

## 2018-04-20 NOTE — Anesthesia Post-Procedure Evaluation
Post-Anesthesia Evaluation    Name: Kenneth Robles      MRN: [id number]     DOB: 22-Feb-1930     Age: 85 y.o.     Sex: male   __________________________________________________________________________     Procedure Date: 04/20/2018  Procedure(s):  ESOPHAGOGASTRODUODENOSCOPY WITH BIOPSY - FLEXIBLE  COLONOSCOPY WITH BIOPSY - FLEXIBLE      Surgeon: Surgeon(s):  Buckles, Vinnie Level, MD    Post-Anesthesia Vitals      Vitals Value Taken Time   BP     Temp     Pulse 61 04/20/2018  9:38 AM   Respirations 19 PER MINUTE 04/20/2018  9:38 AM   SpO2 98 % 04/20/2018  9:38 AM   Vitals shown include unvalidated device data.      Post Anesthesia Evaluation Note    Evaluation location: Pre/Post  Patient participation: recovered; patient participated in evaluation  Level of consciousness: alert    Pain score: 0  Pain management: adequate    Hydration: normovolemia  Temperature: 36.0???C - 38.4???C  Airway patency: adequate    Perioperative Events       Post-op nausea and vomiting: no PONV    Postoperative Status  Cardiovascular status: hemodynamically stable  Respiratory status: spontaneous ventilation        Perioperative Events  Perioperative Event: No

## 2018-04-21 ENCOUNTER — Encounter: Admit: 2018-04-21 | Discharge: 2018-04-21 | Payer: MEDICARE

## 2018-04-21 DIAGNOSIS — E785 Hyperlipidemia, unspecified: Principal | ICD-10-CM

## 2018-04-21 DIAGNOSIS — R972 Elevated prostate specific antigen [PSA]: ICD-10-CM

## 2018-04-21 DIAGNOSIS — L570 Actinic keratosis: ICD-10-CM

## 2018-04-21 DIAGNOSIS — M069 Rheumatoid arthritis, unspecified: ICD-10-CM

## 2018-04-21 DIAGNOSIS — I2510 Atherosclerotic heart disease of native coronary artery without angina pectoris: ICD-10-CM

## 2018-04-21 DIAGNOSIS — IMO0002 Degenerative disc disease: ICD-10-CM

## 2018-04-21 DIAGNOSIS — I219 Acute myocardial infarction, unspecified: ICD-10-CM

## 2018-04-21 DIAGNOSIS — L853 Xerosis cutis: ICD-10-CM

## 2018-04-21 DIAGNOSIS — L818 Other specified disorders of pigmentation: ICD-10-CM

## 2018-04-21 DIAGNOSIS — B079 Viral wart, unspecified: ICD-10-CM

## 2018-04-21 DIAGNOSIS — D692 Other nonthrombocytopenic purpura: ICD-10-CM

## 2018-04-21 DIAGNOSIS — L989 Disorder of the skin and subcutaneous tissue, unspecified: ICD-10-CM

## 2018-04-21 DIAGNOSIS — I8390 Asymptomatic varicose veins of unspecified lower extremity: ICD-10-CM

## 2018-04-21 DIAGNOSIS — K529 Noninfective gastroenteritis and colitis, unspecified: ICD-10-CM

## 2018-04-21 DIAGNOSIS — I10 Essential (primary) hypertension: ICD-10-CM

## 2018-04-21 DIAGNOSIS — K289 Gastrojejunal ulcer, unspecified as acute or chronic, without hemorrhage or perforation: ICD-10-CM

## 2018-04-22 ENCOUNTER — Encounter: Admit: 2018-04-22 | Discharge: 2018-04-22 | Payer: MEDICARE

## 2018-05-04 ENCOUNTER — Encounter: Admit: 2018-05-04 | Discharge: 2018-05-04 | Payer: MEDICARE

## 2018-05-04 DIAGNOSIS — Z7901 Long term (current) use of anticoagulants: ICD-10-CM

## 2018-05-04 DIAGNOSIS — I480 Paroxysmal atrial fibrillation: Principal | ICD-10-CM

## 2018-05-04 LAB — PROTIME INR (PT): Lab: 1.6

## 2018-05-12 ENCOUNTER — Encounter: Admit: 2018-05-12 | Discharge: 2018-05-12 | Payer: MEDICARE

## 2018-05-12 DIAGNOSIS — I480 Paroxysmal atrial fibrillation: Principal | ICD-10-CM

## 2018-05-12 DIAGNOSIS — Z7901 Long term (current) use of anticoagulants: ICD-10-CM

## 2018-05-12 LAB — PROTIME INR (PT): Lab: 2.2 mg/dL (ref <150)

## 2018-05-25 ENCOUNTER — Encounter: Admit: 2018-05-25 | Discharge: 2018-05-25 | Payer: MEDICARE

## 2018-05-25 LAB — PROTIME INR (PT): Lab: 2.2

## 2018-05-29 ENCOUNTER — Encounter: Admit: 2018-05-29 | Discharge: 2018-05-29 | Payer: MEDICARE

## 2018-05-29 DIAGNOSIS — E782 Mixed hyperlipidemia: Principal | ICD-10-CM

## 2018-05-31 MED ORDER — WARFARIN 1 MG PO TAB
ORAL_TABLET | Freq: Every day | ORAL | 0 refills | 90.00000 days | Status: DC
Start: 2018-05-31 — End: 2018-09-27

## 2018-05-31 MED ORDER — ATORVASTATIN 20 MG PO TAB
ORAL_TABLET | Freq: Every day | 0 refills | Status: DC
Start: 2018-05-31 — End: 2018-08-30

## 2018-06-03 ENCOUNTER — Encounter: Admit: 2018-06-03 | Discharge: 2018-06-03 | Payer: MEDICARE

## 2018-06-03 ENCOUNTER — Ambulatory Visit: Admit: 2018-06-03 | Discharge: 2018-06-04 | Payer: MEDICARE

## 2018-06-03 DIAGNOSIS — L570 Actinic keratosis: ICD-10-CM

## 2018-06-03 DIAGNOSIS — B079 Viral wart, unspecified: ICD-10-CM

## 2018-06-03 DIAGNOSIS — I8390 Asymptomatic varicose veins of unspecified lower extremity: ICD-10-CM

## 2018-06-03 DIAGNOSIS — L989 Disorder of the skin and subcutaneous tissue, unspecified: ICD-10-CM

## 2018-06-03 DIAGNOSIS — M069 Rheumatoid arthritis, unspecified: ICD-10-CM

## 2018-06-03 DIAGNOSIS — K289 Gastrojejunal ulcer, unspecified as acute or chronic, without hemorrhage or perforation: ICD-10-CM

## 2018-06-03 DIAGNOSIS — L818 Other specified disorders of pigmentation: ICD-10-CM

## 2018-06-03 DIAGNOSIS — D692 Other nonthrombocytopenic purpura: ICD-10-CM

## 2018-06-03 DIAGNOSIS — I2510 Atherosclerotic heart disease of native coronary artery without angina pectoris: ICD-10-CM

## 2018-06-03 DIAGNOSIS — Z712 Person consulting for explanation of examination or test findings: Secondary | ICD-10-CM

## 2018-06-03 DIAGNOSIS — I10 Essential (primary) hypertension: ICD-10-CM

## 2018-06-03 DIAGNOSIS — IMO0002 Degenerative disc disease: ICD-10-CM

## 2018-06-03 DIAGNOSIS — I219 Acute myocardial infarction, unspecified: ICD-10-CM

## 2018-06-03 DIAGNOSIS — L853 Xerosis cutis: ICD-10-CM

## 2018-06-03 DIAGNOSIS — E785 Hyperlipidemia, unspecified: Principal | ICD-10-CM

## 2018-06-03 DIAGNOSIS — K529 Noninfective gastroenteritis and colitis, unspecified: ICD-10-CM

## 2018-06-03 DIAGNOSIS — R972 Elevated prostate specific antigen [PSA]: ICD-10-CM

## 2018-06-03 NOTE — Patient Instructions
1. Decrease MiraLAX to 1/4 capful daily  2. Try taking hyoscyamine after your morning meal

## 2018-06-03 NOTE — Progress Notes
Telehealth Visit Note    Date of Service: 06/03/2018    Subjective:      Obtained patient's verbal consent to treat them and their agreement to [facility] financial policy and NPP via this telehealth visit during the [facility]       Kenneth Robles is a 85 y.o. male.    History of Present Illness    I had the opportunity to speak to Kenneth Robles via telephone today for a telephone follow-up visit.  Kenneth Robles underwent EGD and colonoscopy April 20, 2018.  His upper endoscopy showed evidence of prior laparoscopic Nissen fundoplication which was in good position.  The upper endoscopy was otherwise normal.  Random gastric and small bowel biopsies were normal.  There is no evidence of H. pylori or celiac sprue.  The colonoscopy showed normal terminal ileum tortuous colon some hemorrhoids but otherwise unremarkable.  Random colon biopsies showed melanosis coli but otherwise normal.  There was no evidence of microscopic colitis.  Patient also underwent a CT abdomen pelvis with contrast which showed some left-sided nephrolithiasis but otherwise was unremarkable.  There were a few small calcifications in the head of the pancreas.  No evidence of bowel obstruction or inflammation.  Patient is doing fairly well at present.  He has had a few spells where he has cramping abdominal pain and some dry heaves and diarrhea.  He estimates this is happened about 3 times since his colonoscopy.  He is currently taking MiraLAX one half of his group daily.  He mostly has problems after his breakfast.  He has tried hyoscyamine a few times after eating and this does seem to abort any symptoms that he might have.  He has had these symptoms for approximately 20 years.  His weight is stable.  He does not have any adverse effects from his hyoscyamine.       Review of Systems   Constitutional: Negative.    HENT: Negative.    Eyes: Negative.    Respiratory: Negative.    Cardiovascular: Negative.

## 2018-06-04 DIAGNOSIS — K582 Mixed irritable bowel syndrome: Principal | ICD-10-CM

## 2018-06-04 DIAGNOSIS — K219 Gastro-esophageal reflux disease without esophagitis: ICD-10-CM

## 2018-06-04 DIAGNOSIS — K5900 Constipation, unspecified: ICD-10-CM

## 2018-06-04 DIAGNOSIS — Z79899 Other long term (current) drug therapy: Secondary | ICD-10-CM

## 2018-06-04 DIAGNOSIS — Z86010 Personal history of colonic polyps: ICD-10-CM

## 2018-06-06 ENCOUNTER — Encounter: Admit: 2018-06-06 | Discharge: 2018-06-06 | Payer: MEDICARE

## 2018-06-07 MED ORDER — KETOCONAZOLE 2 % TP SHAM
TOPICAL | 0 refills | 30.00000 days | Status: DC
Start: 2018-06-07 — End: 2019-01-11

## 2018-06-23 ENCOUNTER — Ambulatory Visit: Admit: 2018-06-23 | Discharge: 2018-06-23 | Payer: MEDICARE

## 2018-06-23 ENCOUNTER — Encounter: Admit: 2018-06-23 | Discharge: 2018-06-23 | Payer: MEDICARE

## 2018-06-23 DIAGNOSIS — I480 Paroxysmal atrial fibrillation: ICD-10-CM

## 2018-06-23 DIAGNOSIS — Z950 Presence of cardiac pacemaker: ICD-10-CM

## 2018-06-23 DIAGNOSIS — I495 Sick sinus syndrome: Principal | ICD-10-CM

## 2018-06-28 ENCOUNTER — Encounter: Admit: 2018-06-28 | Discharge: 2018-06-28

## 2018-06-28 DIAGNOSIS — R1084 Generalized abdominal pain: Secondary | ICD-10-CM

## 2018-06-28 MED ORDER — HYOSCYAMINE SULFATE 0.125 MG SL SUBL
ORAL_TABLET | ORAL | 0 refills | 5.00000 days | Status: DC | PRN
Start: 2018-06-28 — End: 2018-08-01

## 2018-07-06 ENCOUNTER — Encounter: Admit: 2018-07-06 | Discharge: 2018-07-06

## 2018-07-06 ENCOUNTER — Ambulatory Visit: Admit: 2018-07-06 | Discharge: 2018-07-07

## 2018-07-06 DIAGNOSIS — IMO0002 Squamous cell carcinoma: Secondary | ICD-10-CM

## 2018-07-06 DIAGNOSIS — I219 Acute myocardial infarction, unspecified: Secondary | ICD-10-CM

## 2018-07-06 DIAGNOSIS — I2510 Atherosclerotic heart disease of native coronary artery without angina pectoris: Secondary | ICD-10-CM

## 2018-07-06 DIAGNOSIS — I480 Paroxysmal atrial fibrillation: Secondary | ICD-10-CM

## 2018-07-06 DIAGNOSIS — I8390 Asymptomatic varicose veins of unspecified lower extremity: Secondary | ICD-10-CM

## 2018-07-06 DIAGNOSIS — L570 Actinic keratosis: Secondary | ICD-10-CM

## 2018-07-06 DIAGNOSIS — D692 Other nonthrombocytopenic purpura: Secondary | ICD-10-CM

## 2018-07-06 DIAGNOSIS — L818 Other specified disorders of pigmentation: Secondary | ICD-10-CM

## 2018-07-06 DIAGNOSIS — R972 Elevated prostate specific antigen [PSA]: Secondary | ICD-10-CM

## 2018-07-06 DIAGNOSIS — K289 Gastrojejunal ulcer, unspecified as acute or chronic, without hemorrhage or perforation: Secondary | ICD-10-CM

## 2018-07-06 DIAGNOSIS — B079 Viral wart, unspecified: Secondary | ICD-10-CM

## 2018-07-06 DIAGNOSIS — L853 Xerosis cutis: Secondary | ICD-10-CM

## 2018-07-06 DIAGNOSIS — E785 Hyperlipidemia, unspecified: Secondary | ICD-10-CM

## 2018-07-06 DIAGNOSIS — M069 Rheumatoid arthritis, unspecified: Secondary | ICD-10-CM

## 2018-07-06 DIAGNOSIS — K529 Noninfective gastroenteritis and colitis, unspecified: Secondary | ICD-10-CM

## 2018-07-06 DIAGNOSIS — I10 Essential (primary) hypertension: Secondary | ICD-10-CM

## 2018-07-06 DIAGNOSIS — L989 Disorder of the skin and subcutaneous tissue, unspecified: Secondary | ICD-10-CM

## 2018-07-06 NOTE — Progress Notes
Date of Service: 07/06/2018    Kenneth Robles is a 85 y.o. male.       HPI     Kenneth Robles is followed for coronary artery disease and paroxysmal atrial fibrillation.???   He got up from from the working on the computer quickly approximately 4 weeks ago and tripped and fell and bruised his right flank.  He reports no serious injuries.  He used to perform water aerobic exercises at the local pool 3 times a week but has not been able to perform his full exercise during the COVID-19 pandemic.  On November 02, 2017 while at the pool, he he stood up too quickly and became momentarily lightheaded and fell striking his head. A CT scan of the spine and head work was obtained but showed no major bleeding.???   Kenneth Robles continues to have chronic low back discomfort. His CHA2DS2-Vasc???score is 3???so???that he does not require bridging anticoagulation when he stops his warfarin for procedures or surgery. ???However, he would have to to accept the risk of thrombotic complications such as stroke and systemic embolization,???which could be fatal or disabling, if he decides to hold his warfarin for procedures such as epidural injections if required by the administering physician. ???Kenneth Robles eats peaches before he goes to bed at night.  Approximately once a week he develops indigestion briefly when he first lays down but he states that this has been occurring for the past 2 or 3 years and if anything is less common than it was previous.  He reports no exertional chest discomfort whatsoever.  Otherwise, over the past???6???months???Kenneth Robles has felt well and reports no angina, congestive symptoms, palpitations, sensation of sustained forceful heart pounding, or syncope.??????I do not believe that he is aware when he has paroxysmal atrial fibrillation. ???His exercise tolerance has been stable.??????He states that he can walk???100???feet at a???time,???which consists of a???trip to the mailbox and back.??????The patient reports no myalgias, bleeding abnormalities, neurologic motor abnormalities or difficulty with speech .He???continues to have chronic back,hip and knee pain for which he sees a rheumatologist and orthopedic surgeon. 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 ???Mr. Gonyea saw Dr. Wallene Huh on 08/13/2015 because R-wave sensing on the right ventricular lead of his pacemaker had decreased. ???No action was required. ???In August 2017,???his atorvastatin dose was decreased to 20 mg daily because of myalgias, and he is tolerating this dose without adverse effects. ???Kenneth Robles reports occasional right-sided headaches which she relates to chronic neck disease. ???It appears to occur when he turns his head while working on the computer. Kenneth Robles reported orthostasis in late August 2018 and his Imdur was stopped. ???This has improved his orthostasis considerably.His Flomax was stopped for similar reasons.  ??????  Kenneth Robles past medical history is noteworthy for permanent pacemaker placement on 01/22/06. On 09/14/06 he underwent atrial fibrillation ablation complicated by perciardial effusion requiring pericardiocentesis. He underwent successful PCI of the mid and proximal LAD with 2 overlapping Xience drug-eluting stents, a 2.5 x 28 and a 3.0 x 15 Xience drug-eluting stent from distal to proximal on 05/23/09. Coronary angiography performed on 01/07/13 revealed ???  1. Moderate ostial LAD stenosis 40 percent to 50 percent, with a haziness in the mid LAD segment. The proximal LAD was widely patent and the FFR of the LAD was 0.84 at maximal hyperemia.  2. Chronic total occlusion of a high obtuse  marginal branch with left-to-left collaterals, which was unchanged from the prior study dated 2011.  Normal left ventricular end-diastolic pressures.  Kenneth Robles was seen on 06/08/14 after his pacemaker had reached elective replacement interval and had defaulted to VVI pacing. At that time he had developed dyspnea with exertion. He underwent pacemaker generator replacement on 06/12/14. He tripped over a tool in his garage on 03/10/15 resulting in multiple ecchymosis to his face. He indicates that he was evaluated and that there were no serious injuries.???The patient was hospitalized locally on September 03, 2016 for right lower lobe infiltrate/pneumonia was treated initially with Zosyn and then with Augmentin and albuterol inhaler. ???Apparently he had paroxysmal atrial fibrillation at the same time which resolved spontaneously.???The patient was hospitalized on December 09, 2016 for chest discomfort. ???There was no evidence for an acute coronary syndrome. ???Coronary angiography was performed which???showed mild to moderate nonobstructive coronary disease without high-grade stenosis. ???No coronary???intervention was required.???       Vitals:    07/06/18 0828 07/06/18 0834   BP: 126/74 118/64   BP Source: Arm, Left Upper Arm, Right Upper   Pulse: 86    SpO2: 98%    Weight: 95.7 kg (211 lb)    Height: 1.854 m (6' 1)    PainSc: Zero      Body mass index is 27.84 kg/m???.     Past Medical History  Patient Active Problem List    Diagnosis Date Noted   ??? Dizziness 09/30/2016   ??? Hypotension 09/30/2016   ??? Pacemaker battery depletion 06/12/2014   ??? Rheumatoid arthritis(714.0) 03/09/2014   ??? Left-sided low back pain without sciatica 03/02/2014   ??? Lumbar radiculopathy 03/02/2014   ??? Spondylosis of lumbar region without myelopathy or radiculopathy 03/02/2014   ??? Unstable angina (HCC) 01/06/2013   ??? Unspecified hyperplasia of prostate with urinary obstruction and other lower urinary tract symptoms (LUTS) 09/02/2011   ??? Impotence of organic origin 09/02/2011   ??? Pigmented skin lesions - bilateral ear 11/25/2009     Solar lentigo with actinic purpura       ??? CAD (coronary artery disease) 07/05/2009 Admitted with unstable angina. Cardiac cath on 05/23/2009 with successful PCI with overlapping Xience drug-eluting stents, a 2.5 x 28 and a 2.5 x 15 from distal to proximal, postdilated with a 2.75 and subsequently a 3.0 balloon.    08/29/2015 - Stress Test:  This study is mildly abnormal, indicative of a small mild intensity area of reversible ischemia involving the distal inferior wall.  The appearance is very similar to what was seen in 2012. The left ventricular systolic function is normal. There are no high risk prognostic indicators present.       ??? Chest pain 05/21/2009   ??? Osteoarthritis 05/21/2009   ??? PAF (paroxysmal atrial fibrillation) (HCC) 09/14/2006     Paroxysmal symptomatic atrial fibrillation controlled with Tikosyn. Previous left atrial ablation attempt in August 2008 with Dr. Kathleene Hazel. Unfortunately, it was complicated by cardiac tamponade, but the patient did well subsequently. He was placed on Tikosyn which seems to control his arrhythmia very well.     04/2010. Mr Paganini declined restarting Tikosyn because atrial fibrillation wasn't frequent and it was very expensive. Target INR is 2.5.     ??? Hyperlipidemia 09/14/2006     Mr. Grassl has had problems with statin myalgias and side effects from niacin. He declines any future lipid lower therapies.     ??? Cardiac pacemaker in situ 09/14/2006   ??? Sinus bradycardia  01/22/2006   ??? HTN (hypertension) 01/22/2006   ??? CRI (chronic renal insufficiency) 01/22/2006         Review of Systems   Constitution: Positive for malaise/fatigue.   HENT: Positive for hearing loss and tinnitus.    Eyes: Negative.    Cardiovascular: Positive for dyspnea on exertion.   Endocrine: Negative.    Hematologic/Lymphatic: Bruises/bleeds easily.   Skin: Positive for dry skin and skin cancer.   Musculoskeletal: Positive for arthritis, back pain, falls, joint pain, neck pain and stiffness.   Gastrointestinal: Positive for bloating, abdominal pain, constipation, diarrhea, nausea and vomiting.   Genitourinary: Positive for hesitancy and incomplete emptying.   Neurological: Positive for headaches, loss of balance, numbness and paresthesias.   Psychiatric/Behavioral: Negative.    Allergic/Immunologic: Positive for environmental allergies.       Physical Exam  GENERAL: The patient is well developed, well nourished, resting comfortably and in no distress. ???  HEENT: No abnormalities of the visible oro-nasopharynx, conjunctiva or sclera are noted.  NECK: There is no jugular venous distension. Carotids are palpable and without bruits. There is no thyroid enlargement.  Chest: Lung fields are clear to auscultation. There are no wheezes or crackles.???His pacemaker incision in left infraclavicular area looks good???erosion or.  CV: There is a regular rhythm. The first and second heart sounds are normal. There are no murmurs, gallops or rubs. His apical heart rate is???72???BPM.  ABD: The abdomen is soft and supple with normal bowel sounds. There is no hepatosplenomegaly, ascites, tenderness, masses or bruits.  Resolving ecchymosis is noted on his right side from the fall he experienced approximately 4 weeks ago.  Neuro: There are no focal motor defects. Ambulation is normal. Cognitive function appears normal.  Ext:???There is no edema or evidence of deep vein thrombosis. Good radial pulses. Good posterior tibial pulses. Decreased right dorsalis pulse but good capillary refill and no ischemic ulcers.  SKIN:???There are no rashes and no cellulitis  PSYCH:???The patient is calm, rationale and oriented.    Cardiovascular Studies  A twelve-lead ECG was obtained on July 06, 2018 reveals an atrial paced rhythm with a heart rate of 71 bpm.  A nonspecific interventricular conduction defect is noted.  Labs from 03/11/2018 reveals serum potassium 4.1 mmol/L and serum creatinine 1.01 mg/dL.  Labs from 11/20/2017 revealed total cholesterol 118, triglycerides 99, HDL 45 and LDL cholesterol 58 mg/dL.  On February 13 2020s ALT = 16.    Remote pacemaker interrogation 06/23/2018:  Scheduled Carelink transmission received for  Dual chamber PPM. Device function appears appropriate. Presenting EGM shows APVS 84bpm.  Battery longevity 6 year est. Events noted since 03/22/2018:  Atrial:??? None.  Ventricular:??? None. RV Pacing%: <0.1%. Trends are stable.     Echo Doppler 10/03/2016:  1. Normal left ventricular ejection fraction. ???Left ventricular cavity mildly dilated with mild concentric hypertrophy.   2. LVEF= 60%.  3. Mild aortic regurgitation. No other valvular abnormalities noted. ???  4. Right ventricular function normal however size moderately dilated.  5. Pulmonary artery systolic pressure cannot be calculated from this study.  ???Compaired to previous ECHO on 10/11/2012, no major change noted.  ???  Coronary angiography 12/09/2016:  1. Left main coronary artery: ???The left main coronary artery arises normally from left coronary sinus. ???Distally, it bifurcates into the left anterior descending artery and left circumflex artery. ???It is angiographically free of significant disease.  2. Left anterior descending artery:??????The left anterior descending artery appears to be a large-caliber vessel which has previously placed stents  extending from the proximal to mid LAD in overlapping fashion. ???The stents appear to be patent, with 30% in-stent restenosis in the midportion of the stent. ???The ostial LAD has 40% to 50% disease. ???The midportion of the LAD has 20% disease just distal to the previously placed stents. ???The diagonal vessels appear to be small caliber, without significant disease.  3. Left circumflex artery:??????The left circumflex artery appears to be a large-caliber vessel which has 20% disease in the proximal region. ???The 1st obtuse marginal branch is known to be chronically totally occluded. ???It appears to be filling from left-to-left collaterals. ???The 2nd and 3rd obtuse marginal branches appear to be free of significant disease. 4. Right coronary artery:??????The right coronary artery has an anterior and somewhat low takeoff and arises from the right coronary sinus. ???It distally bifurcates into PDA and PLV branches. ???It is a large dominant vessel. ???The RCA has 20% disease extending from the proximal midportion to the mid distal portion. ???The PDA and PLV branches appear to be free of significant disease.  ??????  FINAL IMPRESSION:??????  1. Mild to moderate nonobstructive coronary artery disease,which appears to be unchanged from prior cath, as described above.  2. Patent LAD stent with mild in-stent restenosis  3. Normal LVEDP.  4. No significant gradient across the aortic valve on pullback.  ???  RECOMMENDATIONS:??????The present study appears to be grossly unchanged compared to a prior study in 2014. ???Therefore, we will recommend medical management at this time.  Problems Addressed Today  Problems Addressed Today  Coronary artery disease.  Paroxysmal atrial fibrillation.  Hypercholesterolemia.  Assessment and Plan     Kenneth Robles???reports no angina or congestive symptoms.??? The discomfort that he experiences when he first lies down after eating peaches he believes is indigestion.  He believes that it has improved over recent years and does not want a pursue an ischemic evaluation at this time.  I have asked the patient to carry sublingual nitroglycerin at all times. Regular mild aerobic exercise, weight loss and adherence to a heart healthy diet were recommended.  We spent a long time discussing the importance of observing fall precautions.  I reviewed with him in detail his pacemaker surveillance schedule. ???I have asked him to obtain remote surveillance of his pacemaker every 3 months with a yearly interrogation in the office. ???I have requested his yearly comprehensive pacemaker interrogation prior to July 2019. ???I have asked the office staff to make sure this pacemaker surveillance is scheduled as well. His CHA2DS2-VASc???score appears to be 3 for age and aortic plaque.???The risks and benefits of anticoagulation therapy have been reviewed with the patient. The patient understands that anticoagulation is used to decrease thrombotic or clotting complications associated with atrial fibrillation/flutter, such as stroke and systemic embolization which can be disabling or fatal, but can, on occasion, lead to life-threatening bleeding complications including???gastrointestinal and???intracranial hemorrhage. The patient wishes to???continue???with anticoagulation.???Anticoagulation options were presented to the patient which included warfarin or one of the newer direct acting oral anticoagulants and the patient wanted to???continue warfarin.???I have asked???the patient???to continue working with the cardiovascular nurses so that his INR can be monitored and his dose of warfarin adjusted accordingly.???Kenneth Robles???is considering epidural injections but he has been informed that he would have to stop his warfarin during the periprocedural period.???His CHA2DS2-Vasc???score is 3???so???that he does not require bridging anticoagulation when he stops his warfarin for procedures or surgery. ???However, Kenneth Robles???would have to to accept the risk of thrombotic complications such as stroke and systemic embolization,???which could  be fatal or disabling, if he decides to hold his warfarin for procedures such as epidural injections if required by the administering physician. ???If Kenneth Robles elects to hold his warfarin for procedures/surgery, then I would recommend that he hold his warfarin for as short of a period as possible, restarting warfarin as soon as his bleeding risk is not prohibitive.  The same pertains to aspirin.  He would have to accept the risk of stopping aspirin prior to any procedures which require this.  I have asked him???to return for follow-up in???6???months.         Current Medications (including today's revisions) ??? allopurinol (ZYLOPRIM) 100 mg tablet Take 1 Tab by mouth Daily.   ??? aspirin EC 81 mg PO tablet Take 81 mg by mouth daily.   ??? atorvastatin (LIPITOR) 20 mg tablet Take 1 tablet by mouth once daily   ??? dicyclomine (BENTYL) 10 mg capsule Take 10 mg by mouth three times daily.   ??? finasteride (PROSCAR) 5 mg tablet Take 5 mg by mouth daily.   ??? gabapentin (NEURONTIN) 600 mg tablet Take 600 mg by mouth three times daily.   ??? hydroxychloroquine (PLAQUENIL) 200 mg PO tablet Take 200 mg by mouth twice daily.   ??? hyoscyamine sulfate (LEVSIN/SL) 0.125 mg sublingual tablet PLACE ONE TABLET UNDER THE TONGUE EVERY 4 HOURS AS NEEDED FOR CRAMPS OR DIARRHEA   ??? imiquimod (ALDARA) 5 % topical cream APPLY CREAM TO AFFECTED AREA OF LOWER LIP ONCE A WEEK.   ??? ketoconazole (NIZORAL) 2 % topical shampoo APPLY TOPICALLY TO AFFECTED AREA OF DAMP SCALP, LATHER AND LEAVE ON FOR 5 MINUTES, RINSE AND REPEAT TWICE WEEKLY FOR TREATMENT OF DANDRUFF.   ??? metoprolol XL (TOPROL XL) 25 mg extended release tablet Take one tablet by mouth daily.   ??? nitroglycerin (NITROQUICK) 0.4 mg tablet Place one tablet under tongue every 5 minutes as needed for Chest Pain.   ??? polyethylene glycol 3350 (MIRALAX) 17 g packet Take 17 g by mouth daily as needed.   ??? prednisone (DELTASONE) 2.5 mg tablet Take 2.5 mg by mouth daily with breakfast.   ??? primidone (MYSOLINE) 50 mg tablet Take 1 tablet by mouth daily.   ??? warfarin (COUMADIN) 1 mg tablet TAKE 1 TO 3 TABLETS BY MOUTH ONCE DAILY ALONG WITH A 5 MG TABLET OR AS DIRECTED   ??? warfarin (COUMADIN) 5 mg tablet Take 1-2 tablets by mouth daily as directed by cardiology for INR result.

## 2018-07-13 ENCOUNTER — Encounter: Admit: 2018-07-13 | Discharge: 2018-07-13

## 2018-07-13 DIAGNOSIS — I480 Paroxysmal atrial fibrillation: Secondary | ICD-10-CM

## 2018-07-18 ENCOUNTER — Encounter: Admit: 2018-07-18 | Discharge: 2018-07-18

## 2018-07-19 MED ORDER — METOPROLOL SUCCINATE 25 MG PO TB24
ORAL_TABLET | Freq: Every day | ORAL | 0 refills | 90.00000 days | Status: DC
Start: 2018-07-19 — End: 2018-10-18

## 2018-07-21 ENCOUNTER — Encounter: Admit: 2018-07-21 | Discharge: 2018-07-21

## 2018-07-21 NOTE — Telephone Encounter
Kenneth Robles called the triage line today asking about holding his warfarin and an upcoming procedure. He saw SWB this month and his dictation indicates that he can hold his warfarin w/o bridging.   Whenever doing so carries the risk of potential clot.     I left a message with him regarding his recommendations. I told him the amount of time he should hold it is generally up to the person doing the procedure

## 2018-07-29 ENCOUNTER — Encounter: Admit: 2018-07-29 | Discharge: 2018-07-29

## 2018-07-29 DIAGNOSIS — R1084 Generalized abdominal pain: Secondary | ICD-10-CM

## 2018-08-01 MED ORDER — HYOSCYAMINE SULFATE 0.125 MG SL SUBL
ORAL_TABLET | ORAL | 0 refills | 5.00000 days | Status: DC | PRN
Start: 2018-08-01 — End: 2019-07-05

## 2018-08-03 ENCOUNTER — Encounter: Admit: 2018-08-03 | Discharge: 2018-08-03

## 2018-08-03 NOTE — Telephone Encounter
Patient message is that he is in [place] on vacation and will call us once he returns.

## 2018-08-13 ENCOUNTER — Encounter: Admit: 2018-08-13 | Discharge: 2018-08-13

## 2018-08-13 NOTE — Progress Notes
Sent my chart reminder re: overdue INR. Noted 6/24 nurse note re: updcoming procedure at that time and possibly holding warfarin.     ----- Message -----  From: Asencion Noble  Sent: 08/06/2018  To: Cvm Nurse [place]  Subject: INR Due 08/03/18                                   Kenneth Robles E is due for an INR test on 08/03/18.

## 2018-08-27 NOTE — Progress Notes
Left msg at preferred # re: overdue INR. Asked pt to contact us if pt has had INR drawn recently. Advised if pt has not had INR since last check (June 2020) pt should to get to lab within 1 week for INR. Left cardiology main # for call back prn.    ----- Message -----  From: Asencion Noble  Sent: 08/06/2018  To: Cvm Nurse [place]  Subject: INR Due 08/03/18                                   Kenneth Robles E is due for an INR test on 08/03/18.

## 2018-08-28 ENCOUNTER — Encounter: Admit: 2018-08-28 | Discharge: 2018-08-28

## 2018-08-28 DIAGNOSIS — E782 Mixed hyperlipidemia: Secondary | ICD-10-CM

## 2018-08-28 DIAGNOSIS — I10 Essential (primary) hypertension: Secondary | ICD-10-CM

## 2018-08-28 DIAGNOSIS — I2510 Atherosclerotic heart disease of native coronary artery without angina pectoris: Secondary | ICD-10-CM

## 2018-08-30 LAB — PROTIME INR (PT): Lab: 1.7 pg — ABNORMAL HIGH (ref 26–34)

## 2018-08-30 MED ORDER — ATORVASTATIN 20 MG PO TAB
20 mg | ORAL_TABLET | Freq: Every day | ORAL | 0 refills | Status: DC
Start: 2018-08-30 — End: 2018-11-22

## 2018-08-31 ENCOUNTER — Encounter: Admit: 2018-08-31 | Discharge: 2018-08-31

## 2018-08-31 DIAGNOSIS — I480 Paroxysmal atrial fibrillation: Secondary | ICD-10-CM

## 2018-09-03 ENCOUNTER — Encounter: Admit: 2018-09-03 | Discharge: 2018-09-03

## 2018-09-10 ENCOUNTER — Encounter: Admit: 2018-09-10 | Discharge: 2018-09-10

## 2018-09-10 DIAGNOSIS — I480 Paroxysmal atrial fibrillation: Secondary | ICD-10-CM

## 2018-09-10 LAB — PROTIME INR (PT): Lab: 2.3

## 2018-09-24 ENCOUNTER — Encounter: Admit: 2018-09-24 | Discharge: 2018-09-24

## 2018-09-24 DIAGNOSIS — I480 Paroxysmal atrial fibrillation: Secondary | ICD-10-CM

## 2018-09-27 MED ORDER — WARFARIN 1 MG PO TAB
ORAL_TABLET | Freq: Every day | ORAL | 0 refills | 90.00000 days | Status: DC
Start: 2018-09-27 — End: 2019-02-28

## 2018-09-30 ENCOUNTER — Encounter: Admit: 2018-09-30 | Discharge: 2018-09-30

## 2018-10-01 LAB — PROTIME INR (PT): Lab: 1.7

## 2018-10-05 ENCOUNTER — Encounter: Admit: 2018-10-05 | Discharge: 2018-10-05

## 2018-10-05 DIAGNOSIS — I480 Paroxysmal atrial fibrillation: Secondary | ICD-10-CM

## 2018-10-12 ENCOUNTER — Encounter: Admit: 2018-10-12 | Discharge: 2018-10-12 | Payer: MEDICARE

## 2018-10-12 LAB — PROTIME INR (PT): Lab: 1.9 FL — ABNORMAL LOW (ref 80–100)

## 2018-10-16 ENCOUNTER — Encounter: Admit: 2018-10-16 | Discharge: 2018-10-16 | Payer: MEDICARE

## 2018-10-18 MED ORDER — METOPROLOL SUCCINATE 25 MG PO TB24
ORAL_TABLET | Freq: Every day | ORAL | 3 refills | 90.00000 days | Status: AC
Start: 2018-10-18 — End: ?

## 2018-10-19 ENCOUNTER — Encounter: Admit: 2018-10-19 | Discharge: 2018-10-19 | Payer: MEDICARE

## 2018-10-19 LAB — PROTIME INR (PT): Lab: 2.8

## 2018-11-02 ENCOUNTER — Encounter: Admit: 2018-11-02 | Discharge: 2018-11-02 | Payer: MEDICARE

## 2018-11-02 LAB — PROTIME INR (PT): Lab: 1.7 MMOL/L — ABNORMAL HIGH (ref 21–30)

## 2018-11-02 NOTE — Progress Notes
Pt confirms that he is taking the coumadin dosing we have documented.  He denies missing any doses or changing his diet except that he had a salad yesterday.  He is starting levibid today and that does not show any interaction with coumadin.      He is going to check his INR on Friday and see if it goes back to a therapeutic range if the one salad was the problem, otherwise may need to increase as the dose.      Dosage of coumadin verified, pt verbalized understanding of instructions and when next INR is due to be drawn.

## 2018-11-05 ENCOUNTER — Encounter: Admit: 2018-11-05 | Discharge: 2018-11-05 | Payer: MEDICARE

## 2018-11-05 DIAGNOSIS — I480 Paroxysmal atrial fibrillation: Secondary | ICD-10-CM

## 2018-11-05 LAB — PROTIME INR (PT)
Lab: 1.9
Lab: 20 — ABNORMAL HIGH (ref 9.9–12.6)

## 2018-11-05 NOTE — Progress Notes
Pt says he is taking warfarin 10 mg MWF and 8 mg on all other days rather than as we had noted (10 mg Tues Fri and 8 mg on the other day). He started Levbid (hyoscyamine for IBS) on 11/02/18. He denies missing any doses of warfarin in the past couple weeks. Will continue current dose of warfarin (what he is actually taking) and recheck INR next Tuesday, since it is trending up.

## 2018-11-11 ENCOUNTER — Encounter: Admit: 2018-11-11 | Discharge: 2018-11-11 | Payer: MEDICARE

## 2018-11-11 DIAGNOSIS — I480 Paroxysmal atrial fibrillation: Secondary | ICD-10-CM

## 2018-11-11 LAB — PROTIME INR (PT): Lab: 2.4

## 2018-11-20 ENCOUNTER — Encounter: Admit: 2018-11-20 | Discharge: 2018-11-20 | Payer: MEDICARE

## 2018-11-20 DIAGNOSIS — E782 Mixed hyperlipidemia: Secondary | ICD-10-CM

## 2018-11-22 MED ORDER — ATORVASTATIN 20 MG PO TAB
ORAL_TABLET | Freq: Every day | ORAL | 3 refills | 90.00000 days | Status: AC
Start: 2018-11-22 — End: ?

## 2018-11-25 ENCOUNTER — Encounter: Admit: 2018-11-25 | Discharge: 2018-11-25 | Payer: MEDICARE

## 2018-11-25 NOTE — Telephone Encounter
Patient was phoned as did not show for appointment today with Dr. York Cerise.  Patient is seeking care at the recommendation of his PCP by Dr. Cleatrice Burke" GI in [place].  Patient will phone our office in the future if would like to be seen.

## 2018-11-25 NOTE — Telephone Encounter
11/25/2018 11:59 AM Patient left web mail message asking when his next INR was due.  Called patient back and left him a voicemail to let him know his INR was due on 11/18/18.  Asked him to please have it checked today or tomorrow.

## 2018-11-25 NOTE — Progress Notes
11/25/2018 4:37 PM Reviewed INR and dosing with patient.  INR within range at 2.0. Patient ate some greens last night, so he thinks that is the reason for his INR at the lower end of target range.  He states he only eats greens occasionally.  Continue current maintenance plan and recheck INR on 12/09/2018.  Jacklynn Barnacle, RN

## 2018-11-26 ENCOUNTER — Encounter: Admit: 2018-11-26 | Discharge: 2018-11-26 | Payer: MEDICARE

## 2018-11-26 MED ORDER — WARFARIN 5 MG PO TAB
ORAL_TABLET | Freq: Every day | ORAL | 1 refills | 90.00000 days | Status: AC
Start: 2018-11-26 — End: ?

## 2018-12-09 ENCOUNTER — Encounter: Admit: 2018-12-09 | Discharge: 2018-12-09 | Payer: MEDICARE

## 2018-12-09 DIAGNOSIS — I480 Paroxysmal atrial fibrillation: Secondary | ICD-10-CM

## 2018-12-09 LAB — PROTIME INR (PT): Lab: 2.5 M/UL — ABNORMAL LOW (ref 4.4–5.5)

## 2018-12-22 ENCOUNTER — Encounter: Admit: 2018-12-22 | Discharge: 2018-12-22 | Payer: MEDICARE

## 2018-12-22 DIAGNOSIS — I480 Paroxysmal atrial fibrillation: Secondary | ICD-10-CM

## 2018-12-22 LAB — PROTIME INR (PT): Lab: 2

## 2019-01-07 ENCOUNTER — Encounter: Admit: 2019-01-07 | Discharge: 2019-01-07 | Payer: MEDICARE

## 2019-01-07 DIAGNOSIS — I480 Paroxysmal atrial fibrillation: Secondary | ICD-10-CM

## 2019-01-11 ENCOUNTER — Encounter: Admit: 2019-01-11 | Discharge: 2019-01-11 | Payer: MEDICARE

## 2019-01-11 DIAGNOSIS — I959 Hypotension, unspecified: Secondary | ICD-10-CM

## 2019-01-11 DIAGNOSIS — L818 Other specified disorders of pigmentation: Secondary | ICD-10-CM

## 2019-01-11 DIAGNOSIS — R972 Elevated prostate specific antigen [PSA]: Secondary | ICD-10-CM

## 2019-01-11 DIAGNOSIS — L570 Actinic keratosis: Secondary | ICD-10-CM

## 2019-01-11 DIAGNOSIS — I2510 Atherosclerotic heart disease of native coronary artery without angina pectoris: Secondary | ICD-10-CM

## 2019-01-11 DIAGNOSIS — IMO0002 Squamous cell carcinoma: Secondary | ICD-10-CM

## 2019-01-11 DIAGNOSIS — L989 Disorder of the skin and subcutaneous tissue, unspecified: Secondary | ICD-10-CM

## 2019-01-11 DIAGNOSIS — M069 Rheumatoid arthritis, unspecified: Secondary | ICD-10-CM

## 2019-01-11 DIAGNOSIS — I219 Acute myocardial infarction, unspecified: Secondary | ICD-10-CM

## 2019-01-11 DIAGNOSIS — I8390 Asymptomatic varicose veins of unspecified lower extremity: Secondary | ICD-10-CM

## 2019-01-11 DIAGNOSIS — B079 Viral wart, unspecified: Secondary | ICD-10-CM

## 2019-01-11 DIAGNOSIS — Z950 Presence of cardiac pacemaker: Secondary | ICD-10-CM

## 2019-01-11 DIAGNOSIS — L853 Xerosis cutis: Secondary | ICD-10-CM

## 2019-01-11 DIAGNOSIS — K289 Gastrojejunal ulcer, unspecified as acute or chronic, without hemorrhage or perforation: Secondary | ICD-10-CM

## 2019-01-11 DIAGNOSIS — K529 Noninfective gastroenteritis and colitis, unspecified: Secondary | ICD-10-CM

## 2019-01-11 DIAGNOSIS — E785 Hyperlipidemia, unspecified: Secondary | ICD-10-CM

## 2019-01-11 DIAGNOSIS — D692 Other nonthrombocytopenic purpura: Secondary | ICD-10-CM

## 2019-01-11 DIAGNOSIS — I10 Essential (primary) hypertension: Secondary | ICD-10-CM

## 2019-01-11 NOTE — Progress Notes
Date of Service: 01/11/2019    Kenneth Robles is a 85 y.o. male.       HPI Kenneth Robles is followed for coronary artery disease and paroxysmal atrial fibrillation.? He has resumed his pool exercises which he performs 3 times a week for an hour at a time. ?However, he would have to to accept the risk of thrombotic complications such as stroke and systemic embolization,?which could be fatal or disabling, if he decides to hold his warfarin for procedures such as epidural injections if required by the administering physician. He reports no exertional chest discomfort whatsoever.  Otherwise, over the past?6?months?Kenneth Robles has felt well?and?reports no angina,?congestive symptoms, palpitations, sensation of sustained forceful heart pounding, or syncope.??I do not believe that he is aware when he has paroxysmal atrial fibrillation. ?His exercise tolerance has been stable.??He states that he can walk?100?feet at a?time,?which consists of a?trip to the mailbox and back.?The patient reports no myalgias, bleeding abnormalities, or strokelike symptoms.  He?continues to have chronic back,hip and knee pain for which he sees a rheumatologist and orthopedic surgeon. 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 ??He received a single epidural injection which did not produce much relief.  His CHA2DS2-Vasc?score is 3?so?that he does not require bridging anticoagulation when he stops his warfarin for procedures or surgery. Kenneth Robles saw Dr. Wallene Huh on 08/13/2015 because R-wave sensing on the right ventricular lead of his pacemaker had decreased. ?No action was required. ?In August 2017,?his atorvastatin dose was decreased to 20 mg daily because of myalgias, and he is?tolerating this dose?without adverse effects. ?Kenneth Robles reports occasional right-sided headaches which she relates to chronic neck disease. ?It appears to occur when he turns his head while working on the computer. [name] reported orthostasis in late August 2018 and his Imdur was stopped. ?This has improved his orthostasis considerably.His Flomax was stopped for similar reasons.  ??  Kenneth Robles past medical history is noteworthy for permanent pacemaker placement on 01/22/06. On 09/14/06 he underwent atrial fibrillation ablation complicated by perciardial effusion requiring pericardiocentesis. He underwent successful PCI of the mid and proximal LAD with 2 overlapping Xience drug-eluting stents, a 2.5 x 28 and a 3.0 x 15 Xience drug-eluting stent from distal to proximal on 05/23/09. Coronary angiography performed on 01/07/13 revealed ?  1. Moderate ostial LAD stenosis 40 percent to 50 percent, with a haziness in the mid LAD segment. The proximal LAD was widely patent and the FFR of the LAD was 0.84 at maximal hyperemia.  2. Chronic total occlusion of a high obtuse marginal branch with left-to-left collaterals, which was unchanged from the prior study dated 2011.  Normal left ventricular end-diastolic pressures. Kenneth Robles was seen on 06/08/14 after his pacemaker had reached elective replacement interval and had defaulted to VVI pacing. At that time he had developed dyspnea with exertion. He underwent pacemaker generator replacement on 06/12/14. He tripped over a tool in his garage on 03/10/15 resulting in multiple ecchymosis to his face. He indicates that he was evaluated and that there were no serious injuries.?The patient was hospitalized locally on September 03, 2016 for right lower lobe infiltrate/pneumonia was treated initially with Zosyn and then with Augmentin and albuterol inhaler. ?Apparently he had paroxysmal atrial fibrillation at the same time which resolved spontaneously.?The patient was hospitalized on December 09, 2016 for chest discomfort. ?There was no evidence for an acute coronary syndrome. ?Coronary  angiography was performed which?showed mild to moderate nonobstructive coronary disease without high-grade stenosis. ?No coronary?intervention was required.? On November 02, 2017 while at the pool, he he stood up too quickly and became momentarily lightheaded and fell striking his head. A?CT scan of the spine and head work was obtained but showed no major bleeding.         Vitals:    01/11/19 0850 01/11/19 0901   BP: 112/62 110/62   BP Source: Arm, Left Upper Arm, Right Upper   Pulse: 60    Temp: 36.5 ?C (97.7 ?F)    SpO2: 98%    Weight: 96.6 kg (213 lb)    Height: 1.854 m (6' 1)    PainSc: Four      Body mass index is 28.1 kg/m?Kenneth Robles     Past Medical History  Patient Active Problem List    Diagnosis Date Noted   ? Dizziness 09/30/2016   ? Hypotension 09/30/2016   ? Pacemaker battery depletion 06/12/2014   ? Rheumatoid arthritis(714.0) 03/09/2014   ? Left-sided low back pain without sciatica 03/02/2014   ? Lumbar radiculopathy 03/02/2014   ? Spondylosis of lumbar region without myelopathy or radiculopathy 03/02/2014   ? Unstable angina (HCC) 01/06/2013 ? 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 Impotence of organic origin 09/02/2011   ? Pigmented skin lesions - bilateral ear 11/25/2009     Solar lentigo with actinic purpura       ? CAD (coronary artery disease) 07/05/2009     Admitted with unstable angina. Cardiac cath on 05/23/2009 with successful PCI with overlapping Xience drug-eluting stents, a 2.5 x 28 and a 2.5 x 15 from distal to proximal, postdilated with a 2.75 and subsequently a 3.0 balloon.    08/29/2015 - Stress Test:  This study is mildly abnormal, indicative of a small mild intensity area of reversible ischemia involving the distal inferior wall.  The appearance is very similar to what was seen in 2012. The left ventricular systolic function is normal. There are no high risk prognostic indicators present.       ? Chest pain 05/21/2009   ? Osteoarthritis 05/21/2009   ? PAF (paroxysmal atrial fibrillation) (HCC) 09/14/2006     Paroxysmal symptomatic atrial fibrillation controlled with Tikosyn. Previous left atrial ablation attempt in August 2008 with Dr. Kathleene Hazel. Unfortunately, it was complicated by cardiac tamponade, but the patient did well subsequently. He was placed on Tikosyn which seems to control his arrhythmia very well.     04/2010. Mr Bettin declined restarting Tikosyn because atrial fibrillation wasn't frequent and it was very expensive. Target INR is 2.5.     ? Hyperlipidemia 09/14/2006     Mr. Nicholes has had problems with statin myalgias and side effects from niacin. He declines any future lipid lower therapies.     ? Cardiac pacemaker in situ 09/14/2006   ? Sinus bradycardia 01/22/2006   ? HTN (hypertension) 01/22/2006   ? CRI (chronic renal insufficiency) 01/22/2006         Review of Systems   Constitution: Negative.   HENT: Negative.    Eyes: Negative.    Cardiovascular: Negative.    Respiratory: Negative.    Endocrine: Negative. Hematologic/Lymphatic: Negative.    Skin: Negative.    Musculoskeletal: Positive for back pain.   Gastrointestinal: Negative.    Genitourinary: Negative.    Neurological: Positive for loss of balance.   Psychiatric/Behavioral: Negative.    Allergic/Immunologic:  Negative.        Physical Exam  GENERAL: The patient is well developed, well nourished, resting comfortably and in no distress. ?  HEENT: No abnormalities of the visible oro-nasopharynx, conjunctiva or sclera are noted.  NECK: There is no jugular venous distension. Carotids are palpable and without bruits. There is no thyroid enlargement.  Chest: Lung fields are clear to auscultation. There are no wheezes or crackles.?His pacemaker incision in left infraclavicular area looks good?erosion or.  CV: There is a regular rhythm. The first and second heart sounds are normal. There are no murmurs, gallops or rubs. His apical heart rate is?72?BPM.  ABD: The abdomen is soft and supple with normal bowel sounds. There is no hepatosplenomegaly, ascites, tenderness, masses or bruits.  Resolving ecchymosis is noted on his right side from the fall he experienced approximately 4 weeks ago.  Neuro: There are no focal motor defects. Ambulation is normal. Cognitive function appears normal.  Ext:?There is no edema or evidence of deep vein thrombosis. Good radial pulses. Good posterior tibial pulses. Decreased right dorsalis pulse but good capillary refill and no ischemic ulcers.  SKIN:?There are no rashes and no cellulitis  PSYCH:?The patient is calm, rationale and oriented.    Cardiovascular Studies  A twelve-lead ECG was obtained on July 06, 2018 reveals an atrial paced rhythm with a heart rate of 71 bpm.  A nonspecific interventricular conduction defect is noted. Labs from 03/11/2018 reveals serum potassium 4.1 mmol/L and serum creatinine 1.01 mg/dL.  Labs from 11/20/2017 revealed total cholesterol 118, triglycerides 99, HDL 45 and LDL cholesterol 58 mg/dL.  On February 13 2020s ALT = 16.  ?  Remote pacemaker interrogation 06/23/2018:  Scheduled [facility]?transmission received for ?Dual?chamber PPM. Device function appears appropriate. Presenting EGM shows APVS 84bpm. ?Battery longevity 6 year est. Events noted since 03/22/2018:  Atrial:??None.  Ventricular:??None. RV Pacing%: <0.1%. Trends are stable.   ?  Echo Doppler 10/03/2016:  1. Normal left ventricular ejection fraction. ?Left ventricular cavity mildly dilated with mild concentric hypertrophy.   2. LVEF= 60%.  3. Mild aortic regurgitation. No other valvular abnormalities noted. ?  4. Right ventricular function normal however size moderately dilated.  5. Pulmonary artery systolic pressure cannot be calculated from this study.  ?Compaired to previous ECHO on 10/11/2012, no major change noted.  ?  Coronary angiography 12/09/2016:  1. Left main coronary artery: ?The left main coronary artery arises normally from left coronary sinus. ?Distally, it bifurcates into the left anterior descending artery and left circumflex artery. ?It is angiographically free of significant disease.  2. Left anterior descending artery:??The left anterior descending artery appears to be a large-caliber vessel which has previously placed stents extending from the proximal to mid LAD in overlapping fashion. ?The stents appear to be patent, with 30% in-stent restenosis in the midportion of the stent. ?The ostial LAD has 40% to 50% disease. ?The midportion of the LAD has 20% disease just distal to the previously placed stents. ?The diagonal vessels appear to be small caliber, without significant disease. 3. Left circumflex artery:??The left circumflex artery appears to be a large-caliber vessel which has 20% disease in the proximal region. ?The 1st obtuse marginal branch is known to be chronically totally occluded. ?It appears to be filling from left-to-left collaterals. ?The 2nd and 3rd obtuse marginal branches appear to be free of significant disease.   4. Right coronary artery:??The right coronary artery has an anterior and somewhat low takeoff and arises from the right coronary sinus. ?It distally bifurcates  into PDA and PLV branches. ?It is a large dominant vessel. ?The RCA has 20% disease extending from the proximal midportion to the mid distal portion. ?The PDA and PLV branches appear to be free of significant disease.  ??  FINAL IMPRESSION:??  1. Mild to moderate nonobstructive coronary artery disease,which appears to be unchanged from prior cath, as described above.  2. Patent LAD stent with mild in-stent restenosis  3. Normal LVEDP.  4. No significant gradient across the aortic valve on pullback.  ?  RECOMMENDATIONS:??The present study appears to be grossly unchanged compared to a prior study in 2014. ?Therefore, we will recommend medical management at this time.      Problems Addressed Today  Encounter Diagnoses   Name Primary?   ? Coronary artery disease involving native coronary artery of native heart, angina presence unspecified Yes   ? Cardiac pacemaker in situ    ? Hypotension, unspecified hypotension type    Paroxysmal atrial fibrillation    Assessment and Plan Mr. Brierley?reports no?angina or?congestive symptoms.? I have asked the patient to carry sublingual nitroglycerin at all times. Regular mild aerobic exercise, weight loss and adherence to a heart healthy diet were recommended.  We spent a long time discussing the importance of observing fall precautions.  I reviewed with him in detail his pacemaker surveillance schedule. ?I have asked him to obtain remote surveillance of his pacemaker every 3 months with a yearly interrogation in the office. ?I have asked the office staff to make sure this pacemaker surveillance is scheduled as well. His CHA2DS2-VASc?score appears to be 3 for age and aortic plaque.?The risks and benefits of anticoagulation therapy have been reviewed with the patient. The patient understands that anticoagulation is used to decrease thrombotic or clotting complications associated with atrial fibrillation/flutter, such as stroke and systemic embolization which can be disabling or fatal, but can, on occasion, lead to life-threatening bleeding complications including?gastrointestinal and?intracranial hemorrhage. The patient wishes to?continue?with anticoagulation.?Anticoagulation options were presented to the patient which included warfarin or one of the newer direct acting oral anticoagulants and the patient wanted to?continue warfarin.?I have asked?the patient?to continue working with the cardiovascular nurses so that his INR can be monitored and his dose of warfarin adjusted accordingly. His CHA2DS2-Vasc?score is 3?so?that he does not require bridging anticoagulation when he stops his warfarin for procedures or surgery. ?However, Mr. Gerlt have to to accept the risk of thrombotic complications such as stroke and systemic embolization,?which could be fatal or disabling, if he decides to hold his warfarin for procedures if required by the administering physician. ?If Mr. Gorzynski elects to hold his warfarin for procedures/surgery, then I would recommend that he hold his warfarin for as short of a period as possible, restarting warfarin as soon as his bleeding risk is not prohibitive.  The same pertains to aspirin.  He would have to accept the risk of stopping aspirin prior to any procedures which require this.  I have asked him?to return for follow-up in?6?months.         Current Medications (including today's revisions)  ? allopurinol (ZYLOPRIM) 100 mg tablet Take 1 Tab by mouth Daily.   ? atorvastatin (LIPITOR) 20 mg tablet TAKE 1 TABLET BY MOUTH ONCE DAILY PLEASE  HAVE  YOUR  LABS  DRAWN  PRIOR  TO  ANY  FURTHER  REFILLS   ? finasteride (PROSCAR) 5 mg tablet Take 5 mg by mouth daily.   ? gabapentin (NEURONTIN) 600 mg tablet Take 600 mg by mouth three times daily.   ?  hydroxychloroquine (PLAQUENIL) 200 mg PO tablet Take 200 mg by mouth twice daily.   ? hyoscyamine sulfate (LEVSIN/SL) 0.125 mg sublingual tablet PLACE ONE TABLET UNDER THE TONGUE EVERY 4 HOURS AS NEEDED FOR CRAMPS OR DIARRHEA   ? levocetirizine 5 mg tab    ? methylcellulose (CITRUCEL PO) Take  by mouth.   ? metoprolol XL (TOPROL XL) 25 mg extended release tablet Take 1 tablet by mouth once daily   ? montelukast (SINGULAIR) 10 mg tablet    ? nitroglycerin (NITROQUICK) 0.4 mg tablet Place one tablet under tongue every 5 minutes as needed for Chest Pain.   ? nortriptyline (PAMELOR) 10 mg capsule once daily at bedtime   ? polyethylene glycol 3350 (MIRALAX) 17 g packet Take 17 g by mouth daily as needed.   ? prednisone (DELTASONE) 2.5 mg tablet Take 2.5 mg by mouth daily with breakfast.   ? primidone (MYSOLINE) 50 mg tablet Take 1 tablet by mouth daily.   ? psyllium seed (with dextrose) (FIBER PO) Take 1,250 mg by mouth daily.   ? warfarin (COUMADIN) 1 mg tablet TAKE 1 TO 3 TABLETS BY MOUTH ONCE DAILY ALONG WITH A 5MG  TABLET OR AS DIRECTED   ? warfarin (COUMADIN) 5 mg tablet TAKE 1 TO 2 TABLETS BY MOUTH ONCE DAILY AS DIRECTED BY CARDIOLOGY INR RESULT

## 2019-01-19 ENCOUNTER — Encounter: Admit: 2019-01-19 | Discharge: 2019-01-19 | Payer: MEDICARE

## 2019-01-19 DIAGNOSIS — I480 Paroxysmal atrial fibrillation: Secondary | ICD-10-CM

## 2019-01-19 LAB — PROTIME INR (PT): Lab: 2.3

## 2019-02-09 ENCOUNTER — Encounter: Admit: 2019-02-09 | Discharge: 2019-02-09 | Payer: MEDICARE

## 2019-02-09 NOTE — Telephone Encounter
Received call from [place], RN with Dr. Rosario Adie office stating that Mr. Kenneth Robles is in their clinic today presenting with an obstructing kidney stone that they are wanting to remove this afternoon at 2:30.  They would appreciate any cardiac risk stratification.  He was last seen in clinic by Dr. Jacklyn Shell on 01/11/2019.  He is not having any cardiac symptoms, no chest pain or shortness of breath.    Will route to Dr. Jacklyn Shell for recommendations.

## 2019-02-09 NOTE — Telephone Encounter
Nehemiah Massed, MD  Baldwin Crown, RN; Pat Kocher, RN   Caller: Unspecified (Today, 10:06 AM)            To all: If the patient is having an acute kidney stone, cardiovascular testing does not appear practical. His risk for cardiovascular morbidity and mortality associated with noncardiac procedures and surgery is going to be elevated but that should not prevent procedures and surgery which are required to prevent severe complications such as infection, sepsis and worsening renal function unless he is also having acute cardiac problems. He was stable when I saw him on 01/11/2019. If the urologist holds his anticoagulation, they should hold it for as short of a period as possible restarting his anticoagulation as soon as his bleeding risk is not prohibitive. His CHA2DS2-Vasc score is 3 so that he does not require bridging anticoagulation when he stops his warfarin for procedures or surgery. Thanks. SBG      Will fax to [facility] with Dr. Rosario Adie at [phone]

## 2019-02-15 ENCOUNTER — Encounter: Admit: 2019-02-15 | Discharge: 2019-02-15 | Payer: MEDICARE

## 2019-02-15 DIAGNOSIS — I480 Paroxysmal atrial fibrillation: Secondary | ICD-10-CM

## 2019-02-15 LAB — PROTIME INR (PT): Lab: 1.6

## 2019-02-25 ENCOUNTER — Encounter: Admit: 2019-02-25 | Discharge: 2019-02-25 | Payer: MEDICARE

## 2019-02-25 DIAGNOSIS — I480 Paroxysmal atrial fibrillation: Secondary | ICD-10-CM

## 2019-02-25 LAB — PROTIME INR (PT): Lab: 2.9

## 2019-02-28 ENCOUNTER — Encounter: Admit: 2019-02-28 | Discharge: 2019-02-28 | Payer: MEDICARE

## 2019-02-28 DIAGNOSIS — I480 Paroxysmal atrial fibrillation: Secondary | ICD-10-CM

## 2019-02-28 DIAGNOSIS — Z950 Presence of cardiac pacemaker: Secondary | ICD-10-CM

## 2019-02-28 MED ORDER — WARFARIN 1 MG PO TAB
ORAL_TABLET | Freq: Every day | ORAL | 3 refills | 90.00000 days | Status: AC
Start: 2019-02-28 — End: ?

## 2019-03-11 ENCOUNTER — Encounter: Admit: 2019-03-11 | Discharge: 2019-03-11 | Payer: MEDICARE

## 2019-03-11 DIAGNOSIS — I480 Paroxysmal atrial fibrillation: Secondary | ICD-10-CM

## 2019-03-11 LAB — PROTIME INR (PT): Lab: 2.5

## 2019-03-22 ENCOUNTER — Encounter: Admit: 2019-03-22 | Discharge: 2019-03-22 | Payer: MEDICARE

## 2019-03-22 ENCOUNTER — Ambulatory Visit: Admit: 2019-03-22 | Discharge: 2019-03-22 | Payer: MEDICARE

## 2019-03-22 DIAGNOSIS — Z950 Presence of cardiac pacemaker: Secondary | ICD-10-CM

## 2019-03-22 DIAGNOSIS — I2510 Atherosclerotic heart disease of native coronary artery without angina pectoris: Secondary | ICD-10-CM

## 2019-03-31 ENCOUNTER — Encounter: Admit: 2019-03-31 | Discharge: 2019-03-31 | Payer: MEDICARE

## 2019-04-08 ENCOUNTER — Encounter: Admit: 2019-04-08 | Discharge: 2019-04-08 | Payer: MEDICARE

## 2019-04-08 DIAGNOSIS — I480 Paroxysmal atrial fibrillation: Secondary | ICD-10-CM

## 2019-04-14 ENCOUNTER — Encounter: Admit: 2019-04-14 | Discharge: 2019-04-14 | Payer: MEDICARE

## 2019-04-14 DIAGNOSIS — I480 Paroxysmal atrial fibrillation: Secondary | ICD-10-CM

## 2019-04-14 LAB — PROTIME INR (PT): Lab: 3

## 2019-04-21 ENCOUNTER — Encounter: Admit: 2019-04-21 | Discharge: 2019-04-21 | Payer: MEDICARE

## 2019-04-21 DIAGNOSIS — I480 Paroxysmal atrial fibrillation: Secondary | ICD-10-CM

## 2019-04-21 LAB — PROTIME INR (PT): Lab: 3.3

## 2019-04-29 ENCOUNTER — Encounter: Admit: 2019-04-29 | Discharge: 2019-04-29 | Payer: MEDICARE

## 2019-04-29 DIAGNOSIS — I480 Paroxysmal atrial fibrillation: Secondary | ICD-10-CM

## 2019-05-05 ENCOUNTER — Encounter: Admit: 2019-05-05 | Discharge: 2019-05-05 | Payer: MEDICARE

## 2019-05-05 LAB — PROTIME INR (PT): Lab: 3.8

## 2019-05-12 ENCOUNTER — Encounter: Admit: 2019-05-12 | Discharge: 2019-05-12 | Payer: MEDICARE

## 2019-05-12 DIAGNOSIS — I480 Paroxysmal atrial fibrillation: Secondary | ICD-10-CM

## 2019-05-12 LAB — PROTIME INR (PT): Lab: 2.5

## 2019-06-09 ENCOUNTER — Encounter: Admit: 2019-06-09 | Discharge: 2019-06-09 | Payer: MEDICARE

## 2019-06-09 DIAGNOSIS — I480 Paroxysmal atrial fibrillation: Secondary | ICD-10-CM

## 2019-06-09 LAB — PROTIME INR (PT): Lab: 2.3

## 2019-06-29 ENCOUNTER — Encounter: Admit: 2019-06-29 | Discharge: 2019-06-29 | Payer: MEDICARE

## 2019-06-29 DIAGNOSIS — E782 Mixed hyperlipidemia: Secondary | ICD-10-CM

## 2019-06-29 DIAGNOSIS — I10 Essential (primary) hypertension: Secondary | ICD-10-CM

## 2019-06-29 DIAGNOSIS — I2510 Atherosclerotic heart disease of native coronary artery without angina pectoris: Secondary | ICD-10-CM

## 2019-06-29 DIAGNOSIS — I480 Paroxysmal atrial fibrillation: Secondary | ICD-10-CM

## 2019-07-05 ENCOUNTER — Encounter: Admit: 2019-07-05 | Discharge: 2019-07-05 | Payer: MEDICARE

## 2019-07-05 DIAGNOSIS — M069 Rheumatoid arthritis, unspecified: Secondary | ICD-10-CM

## 2019-07-05 DIAGNOSIS — I219 Acute myocardial infarction, unspecified: Secondary | ICD-10-CM

## 2019-07-05 DIAGNOSIS — E785 Hyperlipidemia, unspecified: Secondary | ICD-10-CM

## 2019-07-05 DIAGNOSIS — R21 Rash and other nonspecific skin eruption: Secondary | ICD-10-CM

## 2019-07-05 DIAGNOSIS — I10 Essential (primary) hypertension: Secondary | ICD-10-CM

## 2019-07-05 DIAGNOSIS — K289 Gastrojejunal ulcer, unspecified as acute or chronic, without hemorrhage or perforation: Secondary | ICD-10-CM

## 2019-07-05 DIAGNOSIS — I2510 Atherosclerotic heart disease of native coronary artery without angina pectoris: Secondary | ICD-10-CM

## 2019-07-05 DIAGNOSIS — IMO0002 Degenerative disc disease: Secondary | ICD-10-CM

## 2019-07-05 DIAGNOSIS — Z950 Presence of cardiac pacemaker: Secondary | ICD-10-CM

## 2019-07-05 DIAGNOSIS — I8390 Asymptomatic varicose veins of unspecified lower extremity: Secondary | ICD-10-CM

## 2019-07-05 DIAGNOSIS — L853 Xerosis cutis: Secondary | ICD-10-CM

## 2019-07-05 DIAGNOSIS — I480 Paroxysmal atrial fibrillation: Secondary | ICD-10-CM

## 2019-07-05 DIAGNOSIS — I959 Hypotension, unspecified: Secondary | ICD-10-CM

## 2019-07-05 DIAGNOSIS — R972 Elevated prostate specific antigen [PSA]: Secondary | ICD-10-CM

## 2019-07-05 DIAGNOSIS — D692 Other nonthrombocytopenic purpura: Secondary | ICD-10-CM

## 2019-07-05 DIAGNOSIS — L989 Disorder of the skin and subcutaneous tissue, unspecified: Secondary | ICD-10-CM

## 2019-07-05 DIAGNOSIS — L814 Other melanin hyperpigmentation: Secondary | ICD-10-CM

## 2019-07-05 DIAGNOSIS — K529 Noninfective gastroenteritis and colitis, unspecified: Secondary | ICD-10-CM

## 2019-07-05 DIAGNOSIS — L570 Actinic keratosis: Secondary | ICD-10-CM

## 2019-07-05 DIAGNOSIS — B079 Viral wart, unspecified: Secondary | ICD-10-CM

## 2019-07-05 NOTE — Progress Notes
Date of Service: 07/05/2019    Kenneth Robles is a 86 y.o. male.       HPI     Mr. Kenneth Robles is followed for coronary artery disease and paroxysmal atrial fibrillation.? He continues his pool exercises which he performs 3 times a week for an hour at a time.  He reports no febrile or infectious symptoms during the Covid pandemic and has received both of his Covid vaccines.  He reports no recent falls.[name] ?Otherwise, over the past?6?months?Mr. Kenneth Robles has felt well?and?reports no angina,?congestive symptoms, palpitations, sensation of sustained forceful heart pounding, or syncope.??I do not believe that he is aware when he has paroxysmal atrial fibrillation. ?His exercise tolerance has been stable.??He states that he can walk?100?feet at a?time,?which consists of a?trip to the mailbox and back.?The patient reports no myalgias, bleeding abnormalities, or strokelike symptoms.  He?continues to have chronic back,hip and knee pain for which he sees a rheumatologist and orthopedic surgeon. 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 ?His CHA2DS2-Vasc?score is 3?so?that he does not require bridging anticoagulation when he stops his warfarin for procedures or surgery. Mr. Kenneth Robles saw Dr. Wallene Huh on 08/13/2015 because R-wave sensing on the right ventricular lead of his pacemaker had decreased. ?No action was required. ?In August 2017,?his atorvastatin dose was decreased to 20 mg daily because of myalgias, and he is?tolerating this dose?without adverse effects. ?Mr. Kenneth Robles reports occasional right-sided headaches which she relates to chronic neck disease. ?It appears to occur when he turns his head while working on the computer. Mr. Kenneth Robles reported orthostasis in late August 2018 and his Imdur was stopped. ?This has improved his orthostasis considerably.His Flomax was stopped for similar reasons.  ??  Mr. Kenneth Robles past medical history is noteworthy for permanent pacemaker placement on 01/22/06. On 09/14/06 he underwent atrial fibrillation ablation complicated by perciardial effusion requiring pericardiocentesis. He underwent successful PCI of the mid and proximal LAD with 2 overlapping Xience drug-eluting stents, a 2.5 x 28 and a 3.0 x 15 Xience drug-eluting stent from distal to proximal on 05/23/09. Coronary angiography performed on 01/07/13 revealed ?  1. Moderate ostial LAD stenosis 40 percent to 50 percent, with a haziness in the mid LAD segment. The proximal LAD was widely patent and the FFR of the LAD was 0.84 at maximal hyperemia.  2. Chronic total occlusion of a high obtuse marginal branch with left-to-left collaterals, which was unchanged from the prior study dated 2011.   3. Normal left ventricular end-diastolic pressures.  Mr. Kenneth Robles was seen on 06/08/14 after his pacemaker had reached elective replacement interval and had defaulted to VVI pacing. At that time he had developed dyspnea with exertion. He underwent pacemaker generator replacement on 06/12/14. He tripped over a tool in his garage on 03/10/15 resulting in multiple ecchymosis to his face. He indicates that he was evaluated and that there were no serious injuries.?The patient was hospitalized locally on September 03, 2016 for right lower lobe infiltrate/pneumonia was treated initially with Zosyn and then with Augmentin and albuterol inhaler. ?Apparently he had paroxysmal atrial fibrillation at the same time which resolved spontaneously.?The patient was hospitalized on December 09, 2016 for chest discomfort. ?There was no evidence for an acute coronary syndrome. ?Coronary angiography was performed which?showed mild to moderate nonobstructive coronary disease without high-grade stenosis. ?No coronary?intervention was required.??On November 02, 2017 while at the pool, he he stood up too quickly and  became momentarily lightheaded and fell striking his head. A?CT scan of the spine and head work was obtained but showed no major bleeding.         Vitals:    07/05/19 0825 07/05/19 0840   BP: 98/58 96/62   BP Source: Arm, Left Upper Arm, Right Upper   Patient Position: Sitting Sitting   Pulse: 84    SpO2: 97%    Weight: 93.9 kg (207 lb)    Height: 1.854 m (6' 1)    PainSc: Zero      Body mass index is 27.31 kg/m?[name]     Past Medical History  Patient Active Problem List    Diagnosis Date Noted   ? Dizziness 09/30/2016   ? Hypotension 09/30/2016   ? Pacemaker battery depletion 06/12/2014   ? Rheumatoid arthritis(714.0) 03/09/2014   ? Left-sided low back pain without sciatica 03/02/2014   ? Lumbar radiculopathy 03/02/2014   ? Spondylosis of lumbar region without myelopathy or radiculopathy 03/02/2014   ? Unstable angina (HCC) 01/06/2013   ? 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 Impotence of organic origin 09/02/2011   ? Pigmented skin lesions - bilateral ear 11/25/2009     Solar lentigo with actinic purpura       ? CAD (coronary artery disease) 07/05/2009     Admitted with unstable angina. Cardiac cath on 05/23/2009 with successful PCI with overlapping Xience drug-eluting stents, a 2.5 x 28 and a 2.5 x 15 from distal to proximal, postdilated with a 2.75 and subsequently a 3.0 balloon.    08/29/2015 - Stress Test:  This study is mildly abnormal, indicative of a small mild intensity area of reversible ischemia involving the distal inferior wall.  The appearance is very similar to what was seen in 2012. The left ventricular systolic function is normal. There are no high risk prognostic indicators present.       ? Chest pain 05/21/2009   ? Osteoarthritis 05/21/2009   ? PAF (paroxysmal atrial fibrillation) (HCC) 09/14/2006     Paroxysmal symptomatic atrial fibrillation controlled with Tikosyn. Previous left atrial ablation attempt in August 2008 with Dr. Kathleene Hazel. Unfortunately, it was complicated by cardiac tamponade, but the patient did well subsequently. He was placed on Tikosyn which seems to control his arrhythmia very well.     04/2010. Mr Cloonan declined restarting Tikosyn because atrial fibrillation wasn't frequent and it was very expensive. Target INR is 2.5.     ? Hyperlipidemia 09/14/2006     Mr. Canfield has had problems with statin myalgias and side effects from niacin. He declines any future lipid lower therapies.     ? Cardiac pacemaker in situ 09/14/2006   ? Sinus bradycardia 01/22/2006   ? HTN (hypertension) 01/22/2006   ? CRI (chronic renal insufficiency) 01/22/2006         Review of Systems   Constitution: Positive for malaise/fatigue.   HENT: Positive for hoarse voice.    Eyes: Negative.    Cardiovascular: Positive for dyspnea on exertion.   Respiratory: Negative.    Endocrine: Positive for polyuria.   Hematologic/Lymphatic: Bruises/bleeds easily.   Skin: Positive for rash.   Musculoskeletal: Positive for arthritis, joint pain, muscle cramps, muscle weakness and myalgias.   Gastrointestinal: Positive for dysphagia.   Genitourinary: Positive for frequency, hesitancy and incomplete emptying.   Neurological: Positive for dizziness, light-headedness and weakness.   Psychiatric/Behavioral: The patient has insomnia.    Allergic/Immunologic: Positive for  environmental allergies.       Physical Exam  GENERAL: The patient is well developed, well nourished, resting comfortably and in no distress. ?  HEENT: No abnormalities of the visible oro-nasopharynx, conjunctiva or sclera are noted.  Wearing hearing aids.  NECK: There is no jugular venous distension. Carotids are palpable and without bruits. There is no thyroid enlargement.  Chest: Lung fields are clear to auscultation. There are no wheezes or crackles.?His pacemaker incision in left infraclavicular area looks good?erosion or.  CV: There is a regular rhythm. The first and second heart sounds are normal. There are no murmurs, gallops or rubs. His apical heart rate is?72?BPM.  ABD: The abdomen is soft and supple with normal bowel sounds. There is no hepatosplenomegaly, ascites, tenderness, masses or bruits.??Resolving ecchymosis is noted on his right side from the fall he experienced approximately 4 weeks ago.  Neuro: There are no focal motor defects. Ambulation is normal. Cognitive function appears normal.  Ext:?There is no edema or evidence of deep vein thrombosis. Good radial pulses. Good posterior tibial pulses. Decreased right dorsalis pulse but good capillary refill and no ischemic ulcers.  SKIN:?Appears to have a mild eczema-like rash on his back.  PSYCH:?The patient is calm, rationale and oriented.    Cardiovascular Studies  A twelve-lead ECG obtained on 07/05/2019 reveals an atrial paced rhythm with a heart rate of 69 bpm.  Right bundle branch block and left anterior fascicular block are noted.  Pacemaker maker interrogation 05/27/2019:  Interpretation Summary    Title: Normal Remote: No Events  * Normal Device Function  * Alerts or events: None  * Battery: OK  * Sensing, impedance and thresholds reviewed  * Programmed parameters reviewed  * Presenting rhythm APVS 76bpm  * Heart Rate Histograms reviewed  * No significant changes noted         Echo Doppler 10/03/2016:  1. Normal left ventricular ejection fraction. ?Left ventricular cavity mildly dilated with mild concentric hypertrophy.   2. LVEF= 60%.  3. Mild aortic regurgitation. No other valvular abnormalities noted. ?  4. Right ventricular function normal however size moderately dilated.  5. Pulmonary artery systolic pressure cannot be calculated from this study.  ?Compaired to previous ECHO on 10/11/2012, no major change noted.  ?  Coronary angiography 12/09/2016:  1. Left main coronary artery: ?The left main coronary artery arises normally from left coronary sinus. ?Distally, it bifurcates into the left anterior descending artery and left circumflex artery. ?It is angiographically free of significant disease.  2. Left anterior descending artery:??The left anterior descending artery appears to be a large-caliber vessel which has previously placed stents extending from the proximal to mid LAD in overlapping fashion. ?The stents appear to be patent, with 30% in-stent restenosis in the midportion of the stent. ?The ostial LAD has 40% to 50% disease. ?The midportion of the LAD has 20% disease just distal to the previously placed stents. ?The diagonal vessels appear to be small caliber, without significant disease.  3. Left circumflex artery:??The left circumflex artery appears to be a large-caliber vessel which has 20% disease in the proximal region. ?The 1st obtuse marginal branch is known to be chronically totally occluded. ?It appears to be filling from left-to-left collaterals. ?The 2nd and 3rd obtuse marginal branches appear to be free of significant disease.   4. Right coronary artery:??The right coronary artery has an anterior and somewhat low takeoff and arises from the right coronary sinus. ?It distally bifurcates into PDA and PLV branches. ?It is a  large dominant vessel. ?The RCA has 20% disease extending from the proximal midportion to the mid distal portion. ?The PDA and PLV branches appear to be free of significant disease.  ??  FINAL IMPRESSION:??  1. Mild to moderate nonobstructive coronary artery disease,which appears to be unchanged from prior cath, as described above.  2. Patent LAD stent with mild in-stent restenosis  3. Normal LVEDP.  4. No significant gradient across the aortic valve on pullback.  ?  RECOMMENDATIONS:??The present study appears to be grossly unchanged compared to a prior study in 2014. ?Therefore, we will recommend medical management at this time.  ?  ?Problems Addressed Today  Encounter Diagnoses   Name Primary?   ? Rash Yes   ? Coronary artery disease involving native coronary artery of native heart    ? Cardiac pacemaker in situ    ? Hypotension, unspecified hypotension type        Assessment and Plan     Mr. Vardeman?reports no?angina or?congestive symptoms.? I have asked the patient to carry sublingual nitroglycerin at all times. Regular mild aerobic exercise, weight loss and adherence to a heart healthy diet were recommended.??Once again, we spent a long time discussing the importance of observing fall precautions. ?I reviewed with him in detail his pacemaker surveillance schedule. ?I have asked him to obtain remote surveillance of his pacemaker every 3 months with a yearly interrogation in the office. His CHA2DS2-VASc?score appears to be 3 for age and aortic plaque.?The risks and benefits of anticoagulation therapy have been reviewed with the patient. The patient understands that anticoagulation is used to decrease thrombotic or clotting complications associated with atrial fibrillation/flutter, such as stroke and systemic embolization which can be disabling or fatal, but can, on occasion, lead to life-threatening bleeding complications including?gastrointestinal and?intracranial hemorrhage. The patient wishes to?continue?with anticoagulation.?Anticoagulation options were presented to the patient which included warfarin or one of the newer direct acting oral anticoagulants and the patient wanted to?continue warfarin.?I have asked?the patient?to continue working with the cardiovascular nurses so that his INR can be monitored and his dose of warfarin adjusted accordingly. His CHA2DS2-Vasc?score is 3?so?that he does not require bridging anticoagulation when he stops his warfarin for procedures or surgery. ?However, Mr. Kinner have to to accept the risk of thrombotic complications such as stroke and systemic embolization,?which could be fatal or disabling, if he decides to hold his warfarin for procedures if required by the administering physician. ?If Mr. Tashjian elects to hold his warfarin for procedures/surgery, then I would recommend that he hold his warfarin for as short of a period as possible, restarting warfarin as soon as his bleeding risk is not prohibitive.??The same pertains to aspirin. ?He would have to accept the risk of stopping aspirin prior to any procedures which require this. ?I have asked him?to return for follow-up in?6?months.  I have requested dermatology referral for his rash on his back and for continued surveillance for skin cancer.         Current Medications (including today's revisions)  ? acetaminophen (TYLENOL) 500 mg tablet Take 500 mg by mouth twice daily. Max of 4,000 mg of acetaminophen in 24 hours.   ? allopurinol (ZYLOPRIM) 100 mg tablet Take 1 Tab by mouth Daily.   ? aspirin EC 81 mg tablet Take 81 mg by mouth daily. Take with food.   ? atorvastatin (LIPITOR) 20 mg tablet TAKE 1 TABLET BY MOUTH ONCE DAILY PLEASE  HAVE  YOUR  LABS  DRAWN  PRIOR  TO  ANY  FURTHER  REFILLS   ? finasteride (PROSCAR) 5 mg tablet Take 5 mg by mouth daily.   ? gabapentin (NEURONTIN) 600 mg tablet Take 600 mg by mouth three times daily.   ? hydroxychloroquine (PLAQUENIL) 200 mg PO tablet Take 200 mg by mouth twice daily.   ? hyoscyamine SR (LEVBID) 0.375 mg tablet Take 0.375 mg by mouth twice daily.   ? levocetirizine 5 mg tab Take 1 tablet by mouth daily.   ? methylcellulose (CITRUCEL PO) Take  by mouth daily.   ? metoprolol XL (TOPROL XL) 25 mg extended release tablet Take 1 tablet by mouth once daily   ? montelukast (SINGULAIR) 10 mg tablet Take 10 mg by mouth daily.   ? nitroglycerin (NITROQUICK) 0.4 mg tablet Place one tablet under tongue every 5 minutes as needed for Chest Pain.   ? polyethylene glycol 3350 (MIRALAX) 17 g packet Take 17 g by mouth daily. Indications: constipation   ? primidone (MYSOLINE) 50 mg tablet Take 3 tablets by mouth at bedtime daily.   ? psyllium seed (with dextrose) (FIBER PO) Take 1,250 mg by mouth daily.   ? warfarin (COUMADIN) 1 mg tablet TAKE 1 TO 3 TABLETS BY MOUTH ONCE DAILY TAKE  WITH 5MG   TABLET  AS  DIRECTED   ? warfarin (COUMADIN) 5 mg tablet TAKE 1 TO 2 TABLETS BY MOUTH ONCE DAILY AS DIRECTED BY CARDIOLOGY INR RESULT

## 2019-07-05 NOTE — Patient Instructions
Thank you for visiting our office today.    Continue the same medications as you have been doing.          We will be pursuing the following tests after your appointment today:       Orders Placed This Encounter   ? AMB REFERRAL TO DERMATOLOGY   ? ECG Today (all locations)         We will plan to see you back in 6 months.  Please call us in the meantime with any questions or concerns.        Please allow 5-7 business days for our providers to review your results. All normal results will go to MyChart. If you do not have Mychart, it is strongly recommended to get this so you can easily view all your results. If you do not have mychart, we will attempt to call you once with normal lab and testing results. If we cannot reach you by phone with normal results, we will send you a letter.  If you have not heard the results of your testing after one week please give [place] a call.       Your Cardiovascular Medicine [place] Team Brett Canales, Pilar Jarvis and [place])  phone number is [phone].

## 2019-07-08 ENCOUNTER — Encounter: Admit: 2019-07-08 | Discharge: 2019-07-08 | Payer: MEDICARE

## 2019-07-08 DIAGNOSIS — I480 Paroxysmal atrial fibrillation: Secondary | ICD-10-CM

## 2019-08-04 ENCOUNTER — Encounter: Admit: 2019-08-04 | Discharge: 2019-08-04 | Payer: MEDICARE

## 2019-08-04 DIAGNOSIS — I480 Paroxysmal atrial fibrillation: Secondary | ICD-10-CM

## 2019-08-04 LAB — PROTIME INR (PT): Lab: 2.3

## 2019-08-04 NOTE — Progress Notes
Left detailed message with results and dosing instructions. Asked pt to call back if questions.

## 2019-08-31 ENCOUNTER — Encounter: Admit: 2019-08-31 | Discharge: 2019-08-31 | Payer: MEDICARE

## 2019-08-31 DIAGNOSIS — I480 Paroxysmal atrial fibrillation: Secondary | ICD-10-CM

## 2019-08-31 LAB — PROTIME INR (PT): Lab: 2.1 MMOL/L (ref 3.5–5.1)

## 2019-09-01 ENCOUNTER — Encounter: Admit: 2019-09-01 | Discharge: 2019-09-01 | Payer: MEDICARE

## 2019-09-01 NOTE — Telephone Encounter
Patient was scheduled for a [employer] Carelink on 08/26/19 that has not been received.    Patient was instructed to send a manual transmission. Instructed if the transmitter does not appear to be working properly, they need to contact the device company directly. Patient was provided with that contact number. Requested the patient send [place] a MyChart message or contact our device nurses at [phone] to let us know after they have sent their transmission. Called preferred phone [phone], Spoke with Klark he will get it done. Patient verbalized understanding.  CDJ

## 2019-09-07 ENCOUNTER — Encounter: Admit: 2019-09-07 | Discharge: 2019-09-07 | Payer: MEDICARE

## 2019-09-13 ENCOUNTER — Encounter: Admit: 2019-09-13 | Discharge: 2019-09-13 | Payer: MEDICARE

## 2019-09-15 ENCOUNTER — Encounter: Admit: 2019-09-15 | Discharge: 2019-09-15 | Payer: MEDICARE

## 2019-09-28 ENCOUNTER — Encounter: Admit: 2019-09-28 | Discharge: 2019-09-28 | Payer: MEDICARE

## 2019-09-28 MED ORDER — WARFARIN 5 MG PO TAB
ORAL_TABLET | Freq: Every day | ORAL | 1 refills | 90.00000 days | Status: AC
Start: 2019-09-28 — End: ?

## 2019-10-07 ENCOUNTER — Encounter: Admit: 2019-10-07 | Discharge: 2019-10-07 | Payer: MEDICARE

## 2019-10-07 DIAGNOSIS — I480 Paroxysmal atrial fibrillation: Secondary | ICD-10-CM

## 2019-10-07 LAB — PROTIME INR (PT): Lab: 1.9 % (ref 11–15)

## 2019-10-07 NOTE — Telephone Encounter
INR Overdue. Called and left message requesting pt to have INR drawn at earliest convenience.  Left callback number for any questions or concerns.

## 2019-10-11 ENCOUNTER — Encounter: Admit: 2019-10-11 | Discharge: 2019-10-11 | Payer: MEDICARE

## 2019-10-11 MED ORDER — METOPROLOL SUCCINATE 25 MG PO TB24
ORAL_TABLET | Freq: Every day | 0 refills
Start: 2019-10-11 — End: ?

## 2019-10-12 ENCOUNTER — Encounter: Admit: 2019-10-12 | Discharge: 2019-10-12 | Payer: MEDICARE

## 2019-10-12 ENCOUNTER — Ambulatory Visit: Admit: 2019-10-12 | Discharge: 2019-10-13 | Payer: MEDICARE

## 2019-10-12 DIAGNOSIS — I2510 Atherosclerotic heart disease of native coronary artery without angina pectoris: Secondary | ICD-10-CM

## 2019-10-12 DIAGNOSIS — L570 Actinic keratosis: Secondary | ICD-10-CM

## 2019-10-12 DIAGNOSIS — Z85828 Personal history of other malignant neoplasm of skin: Secondary | ICD-10-CM

## 2019-10-12 DIAGNOSIS — K289 Gastrojejunal ulcer, unspecified as acute or chronic, without hemorrhage or perforation: Secondary | ICD-10-CM

## 2019-10-12 DIAGNOSIS — L821 Other seborrheic keratosis: Secondary | ICD-10-CM

## 2019-10-12 DIAGNOSIS — D692 Other nonthrombocytopenic purpura: Secondary | ICD-10-CM

## 2019-10-12 DIAGNOSIS — B079 Viral wart, unspecified: Secondary | ICD-10-CM

## 2019-10-12 DIAGNOSIS — I219 Acute myocardial infarction, unspecified: Secondary | ICD-10-CM

## 2019-10-12 DIAGNOSIS — L989 Disorder of the skin and subcutaneous tissue, unspecified: Secondary | ICD-10-CM

## 2019-10-12 DIAGNOSIS — L814 Other melanin hyperpigmentation: Secondary | ICD-10-CM

## 2019-10-12 DIAGNOSIS — I8390 Asymptomatic varicose veins of unspecified lower extremity: Secondary | ICD-10-CM

## 2019-10-12 DIAGNOSIS — K529 Noninfective gastroenteritis and colitis, unspecified: Secondary | ICD-10-CM

## 2019-10-12 DIAGNOSIS — I10 Essential (primary) hypertension: Secondary | ICD-10-CM

## 2019-10-12 DIAGNOSIS — M069 Rheumatoid arthritis, unspecified: Secondary | ICD-10-CM

## 2019-10-12 DIAGNOSIS — L853 Xerosis cutis: Secondary | ICD-10-CM

## 2019-10-12 DIAGNOSIS — R972 Elevated prostate specific antigen [PSA]: Secondary | ICD-10-CM

## 2019-10-12 DIAGNOSIS — E785 Hyperlipidemia, unspecified: Secondary | ICD-10-CM

## 2019-10-12 DIAGNOSIS — IMO0002 Degenerative disc disease: Secondary | ICD-10-CM

## 2019-10-12 MED ORDER — KETOCONAZOLE 2 % TP SHAM
TOPICAL | 3 refills | 30.00000 days | Status: AC
Start: 2019-10-12 — End: ?

## 2019-10-12 MED ORDER — KETOCONAZOLE 2 % TP CREA
Freq: Two times a day (BID) | TOPICAL | 3 refills | 30.00000 days | Status: AC
Start: 2019-10-12 — End: ?

## 2019-10-12 MED ORDER — TRIAMCINOLONE ACETONIDE 0.1 % TP OINT
Freq: Two times a day (BID) | TOPICAL | 3 refills | Status: AC
Start: 2019-10-12 — End: ?

## 2019-10-12 NOTE — Progress Notes
Date of Service: 10/12/2019    Subjective:             Kenneth Robles is a 86 y.o. male.    History of Present Illness  NEW PATIENT referred by [facility]     # H/o squamous cell carcinoma   - L superior helix?s/p Mohs 2015   ?  # Actinic Keratosis/ Actinic Chelitis  - LN2 and PDT in the past. ?  - R post Arm. [id number]- inconclusive pathology. Re-biopsy 11-2015-c/w AK  - previously used imiquimod once weekly to lip as well as [employer]   ?  # Melanocytic nevi distributed over the head, trunk, arms and legs.  - H/o?blistering sunburns  ?  # Seb derm  - improving with ketoconazole shampoo and cream - uses 2-3x weekly  - he is applying shampoo for 5 minutes   ?  #?Seborrheic Keratosis on the torso and extremities.?  ?  Family Hx: no h/o skin cancer. Brother with skin cancer, athology pending  Social Hx: retired, used to work in [employer]. 83 year marriage - 95?(wife present).?  ????????????????12-y younger brother died after experimental Leydig cancer immune tx       Review of Systems   Constitutional: Negative for appetite change, diaphoresis, fatigue, fever and unexpected weight change.   HENT: Negative for congestion, mouth sores and sore throat.    Eyes: Negative for pain, redness, itching and visual disturbance.   Respiratory: Negative for cough and shortness of breath.    Cardiovascular: Negative for palpitations and leg swelling.   Gastrointestinal: Negative for abdominal pain, blood in stool, diarrhea, nausea and vomiting.   Genitourinary: Negative for difficulty urinating and hematuria.   Musculoskeletal: Negative for arthralgias and myalgias.   Neurological: Negative for dizziness, seizures and facial asymmetry.   Hematological: Does not bruise/bleed easily.   Psychiatric/Behavioral: Negative for confusion and dysphoric mood. The patient is not nervous/anxious.          Objective:         ? acetaminophen (TYLENOL) 500 mg tablet Take 500 mg by mouth twice daily. Max of 4,000 mg of acetaminophen in 24 hours.   ? allopurinol (ZYLOPRIM) 100 mg tablet Take 1 Tab by mouth Daily.   ? aspirin EC 81 mg tablet Take 81 mg by mouth daily. Take with food.   ? atorvastatin (LIPITOR) 20 mg tablet TAKE 1 TABLET BY MOUTH ONCE DAILY PLEASE  HAVE  YOUR  LABS  DRAWN  PRIOR  TO  ANY  FURTHER  REFILLS   ? finasteride (PROSCAR) 5 mg tablet Take 5 mg by mouth daily.   ? gabapentin (NEURONTIN) 600 mg tablet Take 600 mg by mouth three times daily.   ? hydroxychloroquine (PLAQUENIL) 200 mg PO tablet Take 200 mg by mouth twice daily.   ? hyoscyamine SR (LEVBID) 0.375 mg tablet Take 0.375 mg by mouth twice daily.   ? levocetirizine 5 mg tab Take 1 tablet by mouth daily.   ? methylcellulose (CITRUCEL PO) Take  by mouth daily.   ? metoprolol XL (TOPROL XL) 25 mg extended release tablet Take 1 tablet by mouth once daily   ? montelukast (SINGULAIR) 10 mg tablet Take 10 mg by mouth daily.   ? nitroglycerin (NITROQUICK) 0.4 mg tablet Place one tablet under tongue every 5 minutes as needed for Chest Pain.   ? polyethylene glycol 3350 (MIRALAX) 17 g packet Take 17 g by mouth daily. Indications: constipation   ? primidone (MYSOLINE) 50 mg tablet Take  3 tablets by mouth at bedtime daily.   ? psyllium seed (with dextrose) (FIBER PO) Take 1,250 mg by mouth daily.   ? warfarin (COUMADIN) 1 mg tablet TAKE 1 TO 3 TABLETS BY MOUTH ONCE DAILY TAKE  WITH  5MG   TABLET  AS  DIRECTED   ? warfarin (COUMADIN) 5 mg tablet TAKE 1 TO 2 TABLETS BY MOUTH ONCE DAILY AS DIRECTED BY CARDIOLOGY INR RESULT     Vitals:    10/12/19 1409   Weight: 87.5 kg (193 lb)   Height: 185.4 cm (73)   PainSc: Zero     Body mass index is 25.46 kg/m?[name]     Physical Exam  Areas Examined (all normal unless noted below):  Head/Face  Neck  Chest/axillae  Back  Abdomen  Buttocks/groin  R upper ext  L upper ext  R lower ext  L lower ext  ?  Pertinent findings include:  Scar at site of prior SCC on L superior helix  Soft, pigmented, stuck-on-appearing papules are distributed over the trunk.? All have symmetric pebbled dermoscopic findings.  Multiple brown and tan evenly pigmented macules are distributed over the head, neck,?trunk, arms and legs.? All have symmetric similar dermascopic findings with primarily globular and reticular patterns.  Scaling without significant erythema is noted on the lower legs and forearms    2 eythematous scaly papules are noted on l upper lip x1 and nose x1   ?  Assessment and Plan:  ?  # History of squamous cell carcinoma in 2015?  - No evidence of recurrence  ?  # Actinic Keratosis  - LN2 x 2 lesions   ?  # Melanocytic nevi  - Will cont to monitor  - RTC for new/changing lesions  ?  # Seborrheic keratoses:  - reassurance  ?  # Xerosis  - Amlactin BID moisturizing to entire body  - decrease shower time and use lukewarm water  - only use body wash to axillae, groin, feet  - Start triamcinolone BID x1-2 weeks for flares   ?  # Seb derm  - Rx ketoconazole 2% shampoo TIW to scalp - leave in for 15 minutes before washing out  - Rx ketoconazole 2% cream BID TAA  ?  RTC 1 year   ?  ?

## 2019-10-12 NOTE — Patient Instructions
Vitamin D  - We recommend Vitamin D supplementation after a fatty meal, especially if there is no contraindications such as kidney stones    Moles  --Common melanocytic nevi (moles) tend to be =6 mm in diameter and symmetric with even pigmentation, round or oval shape, regular outline, and sharp, non-fuzzy border.  --Dermal nevi stick out from the skin, but if they are soft they are usually not worrisome.  --Flaky seemingly stuck-on brown bumps are usually benign keratoses, not moles.  --Bright red smooth bumps that do not bleed are usually benign blood vessel lesions (cherry angiomas), not moles.  --Atypical nevi/clinical features of possible melanoma include asymmetry, border irregularities, color variability, and diameter >6 mm.  The earliest sign of melanoma is usually a rapidly growing mole.  --About half of melanoma arises in an existing mole and up to half on normal skin.  --It is normal to get new moles until the age of 7-45 years old.  --Multiple atypical nevi are a marker of increased risk of melanoma. The risk of melanoma depends also upon the total number of nevi, family and/or personal history of melanoma, and sun exposure history.   --It is important to look at your moles once a month.  It may help to follow them with photos such as on your smart phone.  Looking once a month you can notice rapid changes and call if these occur.  --Using sunscreens and sun avoidance will decrease your risk of developing melanoma.  The best sun protection is sun avoidance, including with clothing such as long sleeves and a broad brimmed hat.  The best sunscreens are SPF 30 or above cream based with zinc oxide.  One ounce (shot glass sized) amount is needed for an adult.  It should be reapplied every 2 hours if possible.  --Any tanning bed use will increase your risk of melanoma significantly.    Sun Protection   UPF/SPF rated clothing (gloves, long sleeves, scarves); broad-brimmed hats (NOT ball caps!)   [employer] additive can increase the SPF value of your everyday clothing   Cowboy hats protect from sun; baseball hats don't   Here are several recommended zinc-based sunscreen brands in aplphabetical order (always read the ingredient list, as many brands have multiple varieties of sunscreens and not all are zinc-based)   Cyndia Bent, [employer], [employer], Culp, [place], [employer], Green Screen,  [employer], Forensic scientist, SkinCeuticals, Vanicream   2 shot-glases = whole body    Melanoma Patient Information    Also called malignant melanoma     Skin cancer screening: If you notice a mole that differs from others or one that changes, bleeds, or itches, see a dermatologist.   Melanoma is a type of skin cancer. Anyone can get melanoma. When found early and treated, the cure rate is nearly 100%. Allowed to grow, melanoma can spread to other parts of the body. Melanoma can spread quickly. When melanoma spreads, it can be deadly.Dermatologists believe that the number of deaths from melanoma would be much lower if people:  Knew the warning signs of melanoma.   Learned how to examine their skin for signs of skin cancer.   Took the time to examine their skin.   It's important to take time to look at the moles on your skin because this is a good way to find melanoma early. When checking your skin, you should look for the ABCDEs of melanoma.     ABCDE's of melanoma:  When performing monthly  skin exams for your moles or new moles, remember the ABCDE's of melanoma:    A - Asymmetry. (Concerning if spot is not symmetric)  B - Border. (Irregular border or notched border are concerning)  C - Color. (Multiple colors or changes in color are concerning.)  D - Diameter. (Larger than 6mm, ie, a pencil eraser, is concerning.)  E - Evolution. (An evolving or changing spot is concerning. If new itch, tenderness, or bleeding develop, these are concerning changes too.  See further explanation below:    Melanoma: Signs and symptoms Anyone can get melanoma. It's important to take time to look at the moles on your skin because this is a good way to find melanoma early. When checking your skin, you should look for the ABCDEs of melanoma.     ABCDEs of melanoma     A = Asymmetry  One half is unlike the other half.       B = Border  An irregular, scalloped, or poorly defined border.       C = Color  Is varied from one area to another; has shades of tan, brown or black, or is sometimes white, red, or blue.       D = Diameter  Melanomas usually greater than 6mm (the size of a pencil eraser) when diagnosed, but they can be smaller.       E = Evolving  A mole or skin lesion that looks different from the rest or is changing in size, shape, or color.    !! If you see a mole or new spot on your skin that has any of the ABCDEs, immediately make an appointment to see a dermatologist.    Signs of melanoma  The most common early signs (what you see) of melanoma are:     Growing mole on your skin.   Unusual looking mole on your skin or a mole that does not look like any other mole on your skin (the ugly duckling).   Non-uniform mole (has an odd shape, uneven or uncertain border, different colors).     Symptoms of melanoma  In the early stages, melanoma may not cause any symptoms (what you feel). But sometimes melanoma will:    Itch.    Bleed.    Feel painful.   Many melanomas have these signs and symptoms, but not all. There are different types of melanoma. One type can first appear as a brown or black streak underneath a fingernail or toenail. Melanoma also can look like a bruise that just won't heal.     Who gets melanoma?  Anyone can get melanoma. Most people who get it have light skin, but people who have brown and black skin also get melanoma.   Some people have a higher risk of getting melanoma. These people have the following traits:   Skin    Fair skin (The risk is higher if the person also has red or blond hair and blue or green eyes). Sun-sensitive skin (rarely tans or burns easily).    50-plus moles, large moles, or unusual-looking moles.    If you have had bad sunburns or spent time tanning (sun, tanning beds, or sun lamps), you also have a higher risk of getting melanoma.   Men older than 50 are at a higher risk for developing skin cancers, including melanoma. Learning how to check your skin and getting skin exams can help detect skin cancer.    Family/medical history   Melanoma runs  in the family (parent, child, sibling, cousin, aunt, uncle had melanoma).   You had another skin cancer, but most especially another melanoma.   A weakened immune system.      Research shows that indoor tanning increases a person's melanoma risk by 75%. The risk also may increase if you had breast or thyroid cancer.    More people getting melanoma  Fewer people are getting most types of cancer. Melanoma is different. More people are getting melanoma. Many are white men who are 50 years or older. More young people also are getting melanoma. Melanoma is now the most common cancer among people 19-66 years old. Even teenagers are getting melanoma.    What causes melanoma?  Ultraviolet (UV) radiation is a major contributor in most cases. We get UV radiation from the sun, tanning beds, and sun lamps. Heredity also plays a role. Research shows that if a close blood relative (parent, child, sibling, aunt, uncle) had melanoma, a person has a much greater risk of getting melanoma.     How do dermatologists diagnose melanoma?  To diagnose melanoma, a dermatologist begins by looking at the patient's skin. A dermatologist will carefully examine moles and other suspicious spots. To get a better look, a dermatologist may use a device called a dermoscope.      Vitamin D    Our bodies need vitamin D to build strong and healthy bones. Vitamin D helps the body absorb the calcium that our bones require.     For a healthy person, the recommended daily dietary allowance is 600 international units for people of 56-6 years old, and 800 international units for people older than 71 years. More vitamin D is not better. Higher amounts of vitamin D could be harmful, leading to many health problems such as high blood pressure and kidney damage.     [employer] D[employer] recommending everyone get vitamin D from foods naturally rich in vitamin D, foods and beverages fortified with vitamin D or vitamin D supplements. The foods that contain the greatest amount are fatty fishes such as salmon, tuna and mackerel. Fish liver oil is another good source.     One of the sources to look up vitamin amount is through [employer]. [employer]. This can help you find out whether you get enough vitamin D from your diet. If you are like many people, you may not be getting your recommended dietary allowance of vitamin D. You may want to change the foods that you eat or take a vitamin D supplements. Before you start taking a vitamin D supplement, talk with your doctor.     Vitamin D is produced in the skin by UV light, but the amount is highly variable and depends on many factors. However, getting vitamin D from the sun or tanning beds can 1) increase your risk of developing skin cancer including melanoma which can be deadly, 2) resulting premature skin aging (wrinkles, age spots, blotchy complexion) and 3) leading to a weakened immune system. Therefore, Teacher, music of Dermatology recommend getting vitamin D safely from foods, beverages and supplements.

## 2019-10-13 DIAGNOSIS — R21 Rash and other nonspecific skin eruption: Secondary | ICD-10-CM

## 2019-10-13 DIAGNOSIS — L570 Actinic keratosis: Secondary | ICD-10-CM

## 2019-10-13 DIAGNOSIS — L853 Xerosis cutis: Secondary | ICD-10-CM

## 2019-10-13 DIAGNOSIS — L219 Seborrheic dermatitis, unspecified: Secondary | ICD-10-CM

## 2019-10-20 ENCOUNTER — Encounter: Admit: 2019-10-20 | Discharge: 2019-10-20 | Payer: MEDICARE

## 2019-10-20 DIAGNOSIS — I480 Paroxysmal atrial fibrillation: Secondary | ICD-10-CM

## 2019-11-04 ENCOUNTER — Encounter: Admit: 2019-11-04 | Discharge: 2019-11-04 | Payer: MEDICARE

## 2019-11-04 DIAGNOSIS — I480 Paroxysmal atrial fibrillation: Secondary | ICD-10-CM

## 2019-11-04 LAB — PROTIME INR (PT): Lab: 2.9

## 2019-11-08 ENCOUNTER — Encounter: Admit: 2019-11-08 | Discharge: 2019-11-08 | Payer: MEDICARE

## 2019-11-08 DIAGNOSIS — E782 Mixed hyperlipidemia: Secondary | ICD-10-CM

## 2019-11-08 MED ORDER — ATORVASTATIN 20 MG PO TAB
ORAL_TABLET | Freq: Every day | 3 refills | Status: AC
Start: 2019-11-08 — End: ?

## 2019-12-01 ENCOUNTER — Encounter: Admit: 2019-12-01 | Discharge: 2019-12-01 | Payer: MEDICARE

## 2019-12-01 DIAGNOSIS — I480 Paroxysmal atrial fibrillation: Secondary | ICD-10-CM

## 2019-12-01 LAB — PROTIME INR (PT): Lab: 1.9

## 2019-12-08 ENCOUNTER — Encounter: Admit: 2019-12-08 | Discharge: 2019-12-08 | Payer: MEDICARE

## 2019-12-08 DIAGNOSIS — I480 Paroxysmal atrial fibrillation: Secondary | ICD-10-CM

## 2019-12-08 LAB — PROTIME INR (PT): Lab: 2.2

## 2019-12-13 ENCOUNTER — Encounter: Admit: 2019-12-13 | Discharge: 2019-12-13 | Payer: MEDICARE

## 2019-12-14 ENCOUNTER — Encounter: Admit: 2019-12-14 | Discharge: 2019-12-14 | Payer: MEDICARE

## 2019-12-26 ENCOUNTER — Encounter: Admit: 2019-12-26 | Discharge: 2019-12-26 | Payer: MEDICARE

## 2019-12-26 DIAGNOSIS — I480 Paroxysmal atrial fibrillation: Secondary | ICD-10-CM

## 2019-12-26 LAB — PROTIME INR (PT): Lab: 2.2 FL (ref 80–100)

## 2020-01-17 ENCOUNTER — Encounter: Admit: 2020-01-17 | Discharge: 2020-01-17 | Payer: MEDICARE

## 2020-01-17 DIAGNOSIS — IMO0002 Squamous cell carcinoma: Secondary | ICD-10-CM

## 2020-01-17 DIAGNOSIS — M069 Rheumatoid arthritis, unspecified: Secondary | ICD-10-CM

## 2020-01-17 DIAGNOSIS — L989 Disorder of the skin and subcutaneous tissue, unspecified: Secondary | ICD-10-CM

## 2020-01-17 DIAGNOSIS — R972 Elevated prostate specific antigen [PSA]: Secondary | ICD-10-CM

## 2020-01-17 DIAGNOSIS — I2510 Atherosclerotic heart disease of native coronary artery without angina pectoris: Secondary | ICD-10-CM

## 2020-01-17 DIAGNOSIS — I10 Essential (primary) hypertension: Secondary | ICD-10-CM

## 2020-01-17 DIAGNOSIS — L814 Other melanin hyperpigmentation: Secondary | ICD-10-CM

## 2020-01-17 DIAGNOSIS — K529 Noninfective gastroenteritis and colitis, unspecified: Secondary | ICD-10-CM

## 2020-01-17 DIAGNOSIS — E785 Hyperlipidemia, unspecified: Secondary | ICD-10-CM

## 2020-01-17 DIAGNOSIS — L853 Xerosis cutis: Secondary | ICD-10-CM

## 2020-01-17 DIAGNOSIS — L570 Actinic keratosis: Secondary | ICD-10-CM

## 2020-01-17 DIAGNOSIS — I480 Paroxysmal atrial fibrillation: Secondary | ICD-10-CM

## 2020-01-17 DIAGNOSIS — K289 Gastrojejunal ulcer, unspecified as acute or chronic, without hemorrhage or perforation: Secondary | ICD-10-CM

## 2020-01-17 DIAGNOSIS — D692 Other nonthrombocytopenic purpura: Secondary | ICD-10-CM

## 2020-01-17 DIAGNOSIS — I219 Acute myocardial infarction, unspecified: Secondary | ICD-10-CM

## 2020-01-17 DIAGNOSIS — B079 Viral wart, unspecified: Secondary | ICD-10-CM

## 2020-01-17 DIAGNOSIS — I8390 Asymptomatic varicose veins of unspecified lower extremity: Secondary | ICD-10-CM

## 2020-01-17 NOTE — Progress Notes
Date of Service: 01/17/2020    Kenneth Robles is a 86 y.o. male.       HPI    Kenneth Robles is followed for coronary artery disease and paroxysmal atrial fibrillation.??He has received his Covid vaccines with booster and wears a mask when he goes shopping.  The patient reports that he is working with his urologist for continued treatment of prostate cancer.  He indicates that he has received radiation therapy previously.  He developed some aching in his thighs with walking which has been only very slowly progressive.  He never has ulcers on his toes.  He continues his pool exercises which he performs 3 times a week for an hour at a time.  He reports no recent falls.Kenneth Robles ?Otherwise, over the past?6?months?Kenneth Robles has felt well?and?reports no angina,?congestive symptoms, palpitations, sensation of sustained forceful heart pounding, or syncope.??I do not believe that he is aware when he has paroxysmal atrial fibrillation. ?His exercise tolerance has been stable.??He states that he can walk?100?feet at a?time,?which consists of a?trip to the mailbox and back.?The patient reports no myalgias, bleeding abnormalities,?or strokelike symptoms.??He?continues to have chronic back,hip and knee pain for which he sees a rheumatologist and orthopedic surgeon.  He also received some type of local therapy on his feet for peripheral neuropathy which helps. 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 ?His CHA2DS2-Vasc?score is 3?so?that he does not require bridging anticoagulation when he stops his warfarin for procedures or surgery. Kenneth Robles saw Dr. Wallene Huh on 08/13/2015 because R-wave sensing on the right ventricular lead of his pacemaker had decreased. ?No action was required. ?In August 2017,?his atorvastatin dose was decreased to 20 mg daily because of myalgias, and he is?tolerating this dose?without adverse effects. ?Kenneth Robles reports occasional right-sided headaches which he relates to chronic neck disease. ?It appears to occur when he turns his head while working on the computer. Kenneth Robles reported orthostasis in late August 2018 and his Imdur was stopped. ?This has improved his orthostasis considerably.His Flomax was stopped for similar reasons.  ??  Kenneth Robles past medical history is noteworthy for permanent pacemaker placement on 01/22/06. On 09/14/06 he underwent atrial fibrillation ablation complicated by perciardial effusion requiring pericardiocentesis. He underwent successful PCI of the mid and proximal LAD with 2 overlapping Xience drug-eluting stents, a 2.5 x 28 and a 3.0 x 15 Xience drug-eluting stent from distal to proximal on 05/23/09. Coronary angiography performed on 01/07/13 revealed ?  1. Moderate ostial LAD stenosis 40 percent to 50 percent, with a haziness in the mid LAD segment. The proximal LAD was widely patent and the FFR of the LAD was 0.84 at maximal hyperemia.  2. Chronic total occlusion of a high obtuse marginal branch with left-to-left collaterals, which was unchanged from the prior study dated 2011.   3. Normal left ventricular end-diastolic pressures.  Kenneth Robles was seen on 06/08/14 after his pacemaker had reached elective replacement interval and had defaulted to VVI pacing. At that time he had developed dyspnea with exertion. He underwent pacemaker generator replacement on 06/12/14. He tripped over a tool in his garage on 03/10/15 resulting in multiple ecchymosis to his face. He indicates that he was evaluated and that there were no serious injuries.?The patient was hospitalized locally on September 03, 2016 for right lower lobe infiltrate/pneumonia was treated initially with Zosyn and then with Augmentin and albuterol inhaler. ?Apparently he had  paroxysmal atrial fibrillation at the same time which resolved spontaneously.?The patient was hospitalized on December 09, 2016 for chest discomfort. ?There was no evidence for an acute coronary syndrome. ?Coronary angiography was performed which?showed mild to moderate nonobstructive coronary disease without high-grade stenosis. ?No coronary?intervention was required.??On November 02, 2017 while at the pool, he he stood up too quickly and became momentarily lightheaded and fell striking his head. A?CT scan of the spine and head work was obtained but showed no major bleeding.       Vitals:    01/17/20 0820   BP: 100/60   BP Source: Arm, Left Upper   Patient Position: Sitting   Pulse: 81   SpO2: 98%   Weight: 89.5 kg (197 lb 6.4 oz)   Height: 1.854 m (6' 1)   PainSc: Six     Body mass index is 26.04 kg/m?Kenneth Robles     Past Medical History  Patient Active Problem List    Diagnosis Date Noted   ? Dizziness 09/30/2016   ? Hypotension 09/30/2016   ? Pacemaker battery depletion 06/12/2014   ? Rheumatoid arthritis(714.0) 03/09/2014   ? Left-sided low back pain without sciatica 03/02/2014   ? Lumbar radiculopathy 03/02/2014   ? Spondylosis of lumbar region without myelopathy or radiculopathy 03/02/2014   ? Unstable angina (HCC) 01/06/2013   ? 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 Impotence of organic origin 09/02/2011   ? Pigmented skin lesions - bilateral ear 11/25/2009     Solar lentigo with actinic purpura       ? CAD (coronary artery disease) 07/05/2009     Admitted with unstable angina. Cardiac cath on 05/23/2009 with successful PCI with overlapping Xience drug-eluting stents, a 2.5 x 28 and a 2.5 x 15 from distal to proximal, postdilated with a 2.75 and subsequently a 3.0 balloon.    08/29/2015 - Stress Test:  This study is mildly abnormal, indicative of a small mild intensity area of reversible ischemia involving the distal inferior wall.  The appearance is very similar to what was seen in 2012. The left ventricular systolic function is normal. There are no high risk prognostic indicators present.       ? Chest pain 05/21/2009   ? Osteoarthritis 05/21/2009   ? PAF (paroxysmal atrial fibrillation) (HCC) 09/14/2006     Paroxysmal symptomatic atrial fibrillation controlled with Tikosyn. Previous left atrial ablation attempt in August 2008 with Dr. Kathleene Hazel. Unfortunately, it was complicated by cardiac tamponade, but the patient did well subsequently. He was placed on Tikosyn which seems to control his arrhythmia very well.     04/2010. Mr Holderby declined restarting Tikosyn because atrial fibrillation wasn't frequent and it was very expensive. Target INR is 2.5.     ? Hyperlipidemia 09/14/2006     Mr. Cuartas has had problems with statin myalgias and side effects from niacin. He declines any future lipid lower therapies.     ? Cardiac pacemaker in situ 09/14/2006   ? Sinus bradycardia 01/22/2006   ? HTN (hypertension) 01/22/2006   ? CRI (chronic renal insufficiency) 01/22/2006         Review of Systems   Constitutional: Negative.   HENT: Negative.    Eyes: Negative.    Cardiovascular: Negative.    Respiratory: Negative.    Endocrine: Negative.    Hematologic/Lymphatic: Negative.    Skin: Negative.    Musculoskeletal: Positive for back pain and joint pain.   Gastrointestinal:  Negative.    Genitourinary: Negative.    Neurological: Negative.    Psychiatric/Behavioral: Negative.    Allergic/Immunologic: Negative.        Physical Exam  GENERAL: The patient is well developed, well nourished, resting comfortably and in no distress. ?  HEENT: No abnormalities of the visible oro-nasopharynx, conjunctiva or sclera are noted.  Wearing hearing aids.  NECK: There is no jugular venous distension. Carotids are palpable and without bruits. There is no thyroid enlargement.  Chest: Lung fields are clear to auscultation. There are no wheezes or crackles.?His pacemaker incision in left infraclavicular area looks good?without erosion or cellulitis.  CV: There is a regular rhythm. The first and second heart sounds are normal. There are no murmurs, gallops or rubs. His apical heart rate is?76?BPM.  ABD: The abdomen is soft and supple with normal bowel sounds. There is no hepatosplenomegaly, ascites, tenderness, masses or bruits.??  Neuro: There are no focal motor defects. Ambulation is normal. Cognitive function appears normal.  Ext:?There is no edema or evidence of deep vein thrombosis. Good radial pulses. Good posterior tibial pulses. Decreased right dorsalis pulse but good capillary refill and no ischemic ulcers.  SKIN:?No cellulitis or skin rashes noted.  PSYCH:?The patient is calm, rationale and oriented.    Cardiovascular Studies  A twelve-lead ECG obtained on 07/05/2019 reveals an atrial paced rhythm with a heart rate of 69 bpm.  Right bundle branch block and left anterior fascicular block are noted.    Pacemaker interrogation 12/14/2019:  Interpretation Summary    Title: Normal Remote: No Events    * Normal Device Function  * Alerts or events: None  * Battery: OK, 4.17 yrs   * Sensing, impedance and thresholds reviewed  * Programmed parameters reviewed  * Presenting rhythm reviewed, Ap-Vs 80s bpm    * Heart Rate Histograms reviewed    Echo Doppler 10/03/2016:  1. Normal left ventricular ejection fraction. ?Left ventricular cavity mildly dilated with mild concentric hypertrophy.   2. LVEF= 60%.  3. Mild aortic regurgitation. No other valvular abnormalities noted. ?  4. Right ventricular function normal however size moderately dilated.  5. Pulmonary artery systolic pressure cannot be calculated from this study.  ?Compaired to previous ECHO on 10/11/2012, no major change noted.  ?  Coronary angiography 12/09/2016:  1. Left main coronary artery: ?The left main coronary artery arises normally from left coronary sinus. ?Distally, it bifurcates into the left anterior descending artery and left circumflex artery. ?It is angiographically free of significant disease.  2. Left anterior descending artery:??The left anterior descending artery appears to be a large-caliber vessel which has previously placed stents extending from the proximal to mid LAD in overlapping fashion. ?The stents appear to be patent, with 30% in-stent restenosis in the midportion of the stent. ?The ostial LAD has 40% to 50% disease. ?The midportion of the LAD has 20% disease just distal to the previously placed stents. ?The diagonal vessels appear to be small caliber, without significant disease.  3. Left circumflex artery:??The left circumflex artery appears to be a large-caliber vessel which has 20% disease in the proximal region. ?The 1st obtuse marginal branch is known to be chronically totally occluded. ?It appears to be filling from left-to-left collaterals. ?The 2nd and 3rd obtuse marginal branches appear to be free of significant disease.   4. Right coronary artery:??The right coronary artery has an anterior and somewhat low takeoff and arises from the right coronary sinus. ?It distally bifurcates into PDA and PLV branches. ?It is  a large dominant vessel. ?The RCA has 20% disease extending from the proximal midportion to the mid distal portion. ?The PDA and PLV branches appear to be free of significant disease.  ??  FINAL IMPRESSION:??  1. Mild to moderate nonobstructive coronary artery disease,which appears to be unchanged from prior cath, as described above.  2. Patent LAD stent with mild in-stent restenosis  3. Normal LVEDP.  4. No significant gradient across the aortic valve on pullback.  ?  RECOMMENDATIONS:??The present study appears to be grossly unchanged compared to a prior study in 2014. ?Therefore, we will recommend medical management at this time.    Cardiovascular Health Factors  Vitals BP Readings from Last 3 Encounters:   01/17/20 100/60   07/05/19 96/62   01/11/19 110/62     Wt Readings from Last 3 Encounters:   01/17/20 89.5 kg (197 lb 6.4 oz)   10/12/19 87.5 kg (193 lb)   07/05/19 93.9 kg (207 lb)     BMI Readings from Last 3 Encounters:   01/17/20 26.04 kg/m?   10/12/19 25.46 kg/m?   07/05/19 27.31 kg/m?      Smoking Social History     Tobacco Use   Smoking Status Never Smoker   Smokeless Tobacco Never Used      Lipid Profile Cholesterol   Date Value Ref Range Status   09/10/2018 117  Final     HDL   Date Value Ref Range Status   09/10/2018 44  Final     LDL   Date Value Ref Range Status   09/10/2018 56  Final     Triglycerides   Date Value Ref Range Status   09/10/2018 86  Final      Blood Sugar No results found for: HGBA1C  Glucose   Date Value Ref Range Status   02/07/2019 104  Final   09/10/2018 86  Final   03/11/2018 76 70 - 100 MG/DL Final          Problems Addressed Today  Coronary artery disease.  Paroxysmal atrial fibrillation.  Hypercholesterolemia.  Assessment and Plan     Mr. Repp?reports no?angina or?congestive symptoms.? The aching in his thighs with exercise is somewhat suggestive for claudication although he has good pulses in both posterior tibial arteries.  We discussed testing for peripheral arterial disease which he wanted to defer at the present time.  I have asked the patient to carry sublingual nitroglycerin at all times. Regular mild aerobic exercise, weight loss and adherence to a heart healthy diet were recommended.??Once again, we spent a long time discussing the importance of observing fall precautions. Pacemaker interrogation reveals no recent paroxysmal atrial fibrillation.?I reviewed with him in detail his pacemaker surveillance schedule. ?I have asked him to obtain remote surveillance of his pacemaker every 3 months with a yearly interrogation in the office. His CHA2DS2-VASc?score appears to be 3 for age and aortic plaque.?The risks and benefits of anticoagulation therapy have been reviewed with the patient. The patient understands that anticoagulation is used to decrease thrombotic or clotting complications associated with atrial fibrillation/flutter, such as stroke and systemic embolization which can be disabling or fatal, but can, on occasion, lead to life-threatening bleeding complications including?gastrointestinal and?intracranial hemorrhage. The patient wishes to?continue?with anticoagulation.?Anticoagulation options were presented to the patient which included warfarin or one of the newer direct acting oral anticoagulants and the patient wanted to?continue warfarin.?I have asked?the patient?to continue working with the cardiovascular nurses so that his INR can be monitored and his dose of warfarin  adjusted accordingly. His CHA2DS2-Vasc?score is 3?so?that he does not require bridging anticoagulation when he stops his warfarin for procedures or surgery. ?However, Mr. Point have to to accept the risk of thrombotic complications such as stroke and systemic embolization,?which could be fatal or disabling, if he decides to hold his warfarin for procedures if required by the administering physician. ?If Mr. Kolm elects to hold his warfarin for procedures/surgery, then I would recommend that he hold his warfarin for as short of a period as possible, restarting warfarin as soon as his bleeding risk is not prohibitive.??The same pertains to aspirin. ?He would have to accept the risk of stopping aspirin prior to any procedures which require this.  He was given a requisition to obtain a Chem-7. ?I have asked him?to return for follow-up in?6?months.          Current Medications (including today's revisions)  ? acetaminophen (TYLENOL) 500 mg tablet Take 500 mg by mouth twice daily. Max of 4,000 mg of acetaminophen in 24 hours.   ? allopurinol (ZYLOPRIM) 100 mg tablet Take 1 Tab by mouth Daily.   ? aspirin EC 81 mg tablet Take 81 mg by mouth daily. Take with food.   ? atorvastatin (LIPITOR) 20 mg tablet TAKE 1 TABLET BY MOUTH ONCE DAILY PLEASE  HAVE  YOUR  LABS  DRAWN  PRIOR  TO  ANY  FURTHER  REFILLS   ? finasteride (PROSCAR) 5 mg tablet Take 5 mg by mouth daily.   ? gabapentin (NEURONTIN) 600 mg tablet Take 600 mg by mouth three times daily.   ? hydroxychloroquine (PLAQUENIL) 200 mg PO tablet Take 200 mg by mouth twice daily.   ? hyoscyamine SR (LEVBID) 0.375 mg tablet Take 0.375 mg by mouth twice daily.   ? ketoconazole (NIZORAL) 2 % topical cream Apply  topically to affected area twice daily.   ? ketoconazole (NIZORAL) 2 % topical shampoo Apply  topically to affected area three times weekly. Apply topically to affected area of damp skin, lather, leave on 5 minutes, and rinse. Repeat 2-3 times per week.   ? levocetirizine 5 mg tab Take 1 tablet by mouth daily.   ? methylcellulose (CITRUCEL PO) Take  by mouth daily.   ? metoprolol XL (TOPROL XL) 25 mg extended release tablet Take 1 tablet by mouth once daily   ? montelukast (SINGULAIR) 10 mg tablet Take 10 mg by mouth daily.   ? nitroglycerin (NITROQUICK) 0.4 mg tablet Place one tablet under tongue every 5 minutes as needed for Chest Pain.   ? polyethylene glycol 3350 (MIRALAX) 17 g packet Take 17 g by mouth daily. Indications: constipation   ? primidone (MYSOLINE) 50 mg tablet Take 3 tablets by mouth at bedtime daily.   ? psyllium seed (with dextrose) (FIBER PO) Take 1,250 mg by mouth daily.   ? terbinafine HCL (LAMISIL) 250 mg tablet    ? triamcinolone acetonide (KENALOG) 0.1 % topical ointment Apply  topically to affected area twice daily.   ? warfarin (COUMADIN) 1 mg tablet TAKE 1 TO 3 TABLETS BY MOUTH ONCE DAILY TAKE  WITH  5MG   TABLET  AS  DIRECTED   ? warfarin (COUMADIN) 5 mg tablet TAKE 1 TO 2 TABLETS BY MOUTH ONCE DAILY AS DIRECTED BY CARDIOLOGY INR RESULT

## 2020-01-19 ENCOUNTER — Encounter: Admit: 2020-01-19 | Discharge: 2020-01-19 | Payer: MEDICARE

## 2020-01-19 DIAGNOSIS — Z7901 Long term (current) use of anticoagulants: Secondary | ICD-10-CM

## 2020-01-19 DIAGNOSIS — I480 Paroxysmal atrial fibrillation: Secondary | ICD-10-CM

## 2020-01-19 DIAGNOSIS — I10 Essential (primary) hypertension: Secondary | ICD-10-CM

## 2020-01-19 NOTE — Progress Notes
Left msg on cell # re: INR results. Advised pt to current dose but increase vit K intake so bring INR back in normal range and recheck in ~ 2 weeks. Left cardiology [facility] nurse line # for call back prn.

## 2020-01-23 ENCOUNTER — Encounter: Admit: 2020-01-23 | Discharge: 2020-01-23 | Payer: MEDICARE

## 2020-01-23 MED ORDER — NITROGLYCERIN 0.4 MG SL SUBL
ORAL_TABLET | SUBLINGUAL | 0 refills | 9.00000 days | Status: AC | PRN
Start: 2020-01-23 — End: ?

## 2020-02-03 ENCOUNTER — Encounter: Admit: 2020-02-03 | Discharge: 2020-02-03 | Payer: MEDICARE

## 2020-02-03 DIAGNOSIS — I480 Paroxysmal atrial fibrillation: Secondary | ICD-10-CM

## 2020-02-03 LAB — PROTIME INR (PT): Lab: 1.9

## 2020-02-10 ENCOUNTER — Encounter: Admit: 2020-02-10 | Discharge: 2020-02-10 | Payer: MEDICARE

## 2020-02-10 NOTE — Telephone Encounter
Alisha from [facility] called to get approval to hold the patient's coumadin for 5 days prior to having epidural injections. (Contact info: p. [phone], f. [phone]).    Will route to Dr. Jacklyn Shell for his recommendations.

## 2020-02-14 ENCOUNTER — Encounter: Admit: 2020-02-14 | Discharge: 2020-02-14 | Payer: MEDICARE

## 2020-02-22 ENCOUNTER — Encounter: Admit: 2020-02-22 | Discharge: 2020-02-22 | Payer: MEDICARE

## 2020-02-22 NOTE — Telephone Encounter
INR Overdue. Called and left message requesting pt to have INR drawn at earliest convenience.  Left callback number for any questions or concerns.

## 2020-02-23 ENCOUNTER — Encounter: Admit: 2020-02-23 | Discharge: 2020-02-23 | Payer: MEDICARE

## 2020-02-23 DIAGNOSIS — I480 Paroxysmal atrial fibrillation: Secondary | ICD-10-CM

## 2020-02-23 LAB — PROTIME INR (PT): Lab: 1.7 — ABNORMAL HIGH (ref 0.89–1.11)

## 2020-03-02 ENCOUNTER — Encounter: Admit: 2020-03-02 | Discharge: 2020-03-02 | Payer: MEDICARE

## 2020-03-02 DIAGNOSIS — I480 Paroxysmal atrial fibrillation: Secondary | ICD-10-CM

## 2020-03-08 ENCOUNTER — Encounter

## 2020-03-08 DIAGNOSIS — Z950 Presence of cardiac pacemaker: Secondary | ICD-10-CM

## 2020-03-08 DIAGNOSIS — R001 Bradycardia, unspecified: Secondary | ICD-10-CM

## 2020-03-08 DIAGNOSIS — I480 Paroxysmal atrial fibrillation: Secondary | ICD-10-CM

## 2020-03-08 LAB — PROTIME INR (PT): Lab: 2.1 — ABNORMAL HIGH (ref 0.89–1.11)

## 2020-03-16 ENCOUNTER — Encounter

## 2020-03-16 DIAGNOSIS — I480 Paroxysmal atrial fibrillation: Secondary | ICD-10-CM

## 2020-03-16 LAB — PROTIME INR (PT): Lab: 1.5

## 2020-03-22 ENCOUNTER — Encounter: Admit: 2020-03-22 | Discharge: 2020-03-22 | Payer: MEDICARE

## 2020-03-22 DIAGNOSIS — I480 Paroxysmal atrial fibrillation: Secondary | ICD-10-CM

## 2020-03-22 LAB — PROTIME INR (PT): Lab: 2.4

## 2020-03-23 ENCOUNTER — Encounter: Admit: 2020-03-23 | Discharge: 2020-03-23 | Payer: MEDICARE

## 2020-03-23 DIAGNOSIS — E782 Mixed hyperlipidemia: Secondary | ICD-10-CM

## 2020-03-23 MED ORDER — METOPROLOL SUCCINATE 25 MG PO TB24
25 mg | ORAL_TABLET | Freq: Every day | ORAL | 3 refills | 90.00000 days | Status: AC
Start: 2020-03-23 — End: ?

## 2020-03-23 MED ORDER — WARFARIN 5 MG PO TAB
ORAL_TABLET | ORAL | 3 refills | 90.00000 days | Status: AC
Start: 2020-03-23 — End: ?

## 2020-03-23 MED ORDER — WARFARIN 1 MG PO TAB
1-3 mg | ORAL_TABLET | Freq: Every day | ORAL | 3 refills | 90.00000 days | Status: AC
Start: 2020-03-23 — End: ?

## 2020-03-23 MED ORDER — ATORVASTATIN 20 MG PO TAB
20 mg | ORAL_TABLET | Freq: Every day | ORAL | 3 refills | Status: AC
Start: 2020-03-23 — End: ?

## 2020-04-05 ENCOUNTER — Encounter: Admit: 2020-04-05 | Discharge: 2020-04-05 | Payer: MEDICARE

## 2020-04-05 DIAGNOSIS — I480 Paroxysmal atrial fibrillation: Secondary | ICD-10-CM

## 2020-04-12 ENCOUNTER — Encounter: Admit: 2020-04-12 | Discharge: 2020-04-12 | Payer: MEDICARE

## 2020-04-12 DIAGNOSIS — I480 Paroxysmal atrial fibrillation: Secondary | ICD-10-CM

## 2020-05-03 ENCOUNTER — Encounter: Admit: 2020-05-03 | Discharge: 2020-05-03 | Payer: MEDICARE

## 2020-05-03 DIAGNOSIS — I480 Paroxysmal atrial fibrillation: Secondary | ICD-10-CM

## 2020-05-03 LAB — PROTIME INR (PT): Lab: 2.5

## 2020-05-12 ENCOUNTER — Inpatient Hospital Stay: Admit: 2020-05-12 | Discharge: 2020-05-12 | Payer: MEDICARE

## 2020-05-12 ENCOUNTER — Inpatient Hospital Stay: Admit: 2020-05-12 | Payer: MEDICARE

## 2020-05-12 ENCOUNTER — Encounter: Admit: 2020-05-12 | Discharge: 2020-05-12 | Payer: MEDICARE

## 2020-05-12 DIAGNOSIS — K56609 Unspecified intestinal obstruction, unspecified as to partial versus complete obstruction: Secondary | ICD-10-CM

## 2020-05-12 LAB — COMPREHENSIVE METABOLIC PANEL
Lab: 0.3 mg/dL (ref 0.3–1.2)
Lab: 138 MMOL/L — ABNORMAL LOW (ref 137–147)
Lab: 15 U/L (ref 7–56)
Lab: 23 U/L (ref 7–40)
Lab: 26 MMOL/L (ref 21–30)
Lab: 4.2 g/dL (ref 3.5–5.0)
Lab: 6.4 g/dL (ref 6.0–8.0)
Lab: 8.9 mg/dL — ABNORMAL HIGH (ref 8.5–10.6)
Lab: 85 U/L (ref 25–110)
Lab: >60 mL/min (ref >60)
Lab: 0.3 mg/dL (ref 0.3–1.2)
Lab: 1.1 mg/dL (ref 0.4–1.24)
Lab: 12 (ref 3–12)
Lab: 129 mg/dL — ABNORMAL HIGH (ref 70–100)
Lab: 14 U/L (ref 7–56)
Lab: 143 MMOL/L (ref 137–147)
Lab: 15 mg/dL (ref 7–25)
Lab: 23 MMOL/L (ref 21–30)
Lab: 23 U/L (ref 7–40)
Lab: 3.8 g/dL (ref 3.5–5.0)
Lab: 5.9 g/dL — ABNORMAL LOW (ref 6.0–8.0)
Lab: 77 U/L (ref 25–110)
Lab: 8 mg/dL — ABNORMAL LOW (ref 8.5–10.6)
Lab: >60 mL/min (ref >60)

## 2020-05-12 LAB — CBC
Lab: 13 g/dL — ABNORMAL LOW (ref 13.5–16.5)
Lab: 32 pg (ref 26–34)
Lab: 33 g/dL (ref 32.0–36.0)
Lab: 9.4 K/UL (ref 4.5–11.0)
Lab: 11 g/dL — ABNORMAL LOW (ref 13.5–16.5)
Lab: 15 % — ABNORMAL HIGH (ref 11–15)
Lab: 3.5 M/UL — ABNORMAL LOW (ref 4.4–5.5)
Lab: 9.7 K/UL (ref 4.5–11.0)

## 2020-05-12 LAB — PROTIME INR (PT)
Lab: 2.2 — ABNORMAL HIGH (ref 0.8–1.2)
Lab: 25 s — ABNORMAL HIGH (ref 8.5–14.4)
Lab: 19 s — ABNORMAL HIGH (ref 8.5–14.4)

## 2020-05-12 LAB — PHOSPHORUS
Lab: 3.4 mg/dL — ABNORMAL LOW (ref 2.0–4.5)
Lab: 3.8 mg/dL (ref 2.0–4.5)

## 2020-05-12 LAB — LACTIC ACID(LACTATE)
Lab: 1.2 MMOL/L — ABNORMAL HIGH (ref 0.5–2.0)
Lab: 1.5 MMOL/L (ref 0.5–2.0)

## 2020-05-12 LAB — MAGNESIUM: Lab: 2 mg/dL — ABNORMAL LOW (ref 1.6–2.6)

## 2020-05-12 LAB — PTT (APTT): Lab: 39 s — ABNORMAL HIGH (ref 24.0–36.5)

## 2020-05-12 MED ORDER — ROCURONIUM 10 MG/ML IV SOLN
INTRAVENOUS | 0 refills | Status: DC
Start: 2020-05-12 — End: 2020-05-12
  Administered 2020-05-12: 16:00:00 20 mg via INTRAVENOUS

## 2020-05-12 MED ORDER — FENTANYL CITRATE (PF) 50 MCG/ML IJ SOLN
INTRAVENOUS | 0 refills | Status: DC
Start: 2020-05-12 — End: 2020-05-12
  Administered 2020-05-12 (×3): 50 ug via INTRAVENOUS

## 2020-05-12 MED ORDER — PHENYLEPHRINE HCL IN 0.9% NACL 1 MG/10 ML (100 MCG/ML) IV SYRG
INTRAVENOUS | 0 refills | Status: DC
Start: 2020-05-12 — End: 2020-05-12
  Administered 2020-05-12: 16:00:00 50 ug via INTRAVENOUS

## 2020-05-12 MED ORDER — FENTANYL CITRATE (PF) 50 MCG/ML IJ SOLN
25-50 ug | INTRAVENOUS | 0 refills | Status: DC | PRN
Start: 2020-05-12 — End: 2020-05-12

## 2020-05-12 MED ORDER — LABETALOL 5 MG/ML IV SYRG
INTRAVENOUS | 0 refills | Status: DC
Start: 2020-05-12 — End: 2020-05-12
  Administered 2020-05-12: 17:00:00 5 mg via INTRAVENOUS
  Administered 2020-05-12: 17:00:00 20 mg via INTRAVENOUS
  Administered 2020-05-12: 17:00:00 10 mg via INTRAVENOUS
  Administered 2020-05-12: 17:00:00 5 mg via INTRAVENOUS

## 2020-05-12 MED ORDER — DEXTROSE 5%-0.45% SODIUM CHLORIDE & POTASSIUM CHLORIDE 40 MEQ/L IV SOLP
INTRAVENOUS | 0 refills | Status: AC
Start: 2020-05-12 — End: ?
  Administered 2020-05-12 – 2020-05-13 (×2): 1000.000 mL via INTRAVENOUS

## 2020-05-12 MED ORDER — CALCIUM GLUC IN NACL, ISO-OSM 1 GRAM/100 ML IV SOLN
1 g | Freq: Once | INTRAVENOUS | 0 refills | Status: CP
Start: 2020-05-12 — End: ?
  Administered 2020-05-12: 18:00:00 1 g via INTRAVENOUS

## 2020-05-12 MED ORDER — ONDANSETRON HCL (PF) 4 MG/2 ML IJ SOLN
INTRAVENOUS | 0 refills | Status: DC
Start: 2020-05-12 — End: 2020-05-12
  Administered 2020-05-12: 17:00:00 4 mg via INTRAVENOUS

## 2020-05-12 MED ORDER — ONDANSETRON HCL (PF) 4 MG/2 ML IJ SOLN
4 mg | INTRAVENOUS | 0 refills | Status: AC | PRN
Start: 2020-05-12 — End: ?
  Administered 2020-05-14 – 2020-05-25 (×5): 4 mg via INTRAVENOUS

## 2020-05-12 MED ORDER — SUCCINYLCHOLINE CHLORIDE 20 MG/ML IJ SOLN
INTRAVENOUS | 0 refills | Status: DC
Start: 2020-05-12 — End: 2020-05-12
  Administered 2020-05-12: 16:00:00 100 mg via INTRAVENOUS

## 2020-05-12 MED ORDER — ELECTROLYTE-A IV SOLP
INTRAVENOUS | 0 refills | Status: DC
Start: 2020-05-12 — End: 2020-05-12
  Administered 2020-05-12: 16:00:00 via INTRAVENOUS

## 2020-05-12 MED ORDER — LACTATED RINGERS IV SOLP
INTRAVENOUS | 0 refills | Status: DC
Start: 2020-05-12 — End: 2020-05-12
  Administered 2020-05-12: 15:00:00 1000.000 mL via INTRAVENOUS

## 2020-05-12 MED ORDER — PROPOFOL INJ 10 MG/ML IV VIAL
INTRAVENOUS | 0 refills | Status: DC
Start: 2020-05-12 — End: 2020-05-12
  Administered 2020-05-12: 16:00:00 110 mg via INTRAVENOUS

## 2020-05-12 MED ORDER — PERFLUTREN LIPID MICROSPHERES 1.1 MG/ML IV SUSP
1-20 mL | Freq: Once | INTRAVENOUS | 0 refills | Status: CP | PRN
Start: 2020-05-12 — End: ?
  Administered 2020-05-12: 19:00:00 1.5 mL via INTRAVENOUS

## 2020-05-12 MED ORDER — FENTANYL PCA 500 MCG/50 ML SYR (STD CONC)(ADULT)(PREMADE)
INTRAVENOUS | 0 refills | Status: AC
Start: 2020-05-12 — End: ?
  Administered 2020-05-12 – 2020-05-16 (×7): 50.000 mL via INTRAVENOUS

## 2020-05-12 MED ORDER — LACTATED RINGERS IV SOLP
INTRAVENOUS | 0 refills | Status: DC
Start: 2020-05-12 — End: 2020-05-12
  Administered 2020-05-12: 13:00:00 1000.000 mL via INTRAVENOUS

## 2020-05-12 MED ORDER — SUGAMMADEX 100 MG/ML IV SOLN
INTRAVENOUS | 0 refills | Status: DC
Start: 2020-05-12 — End: 2020-05-12
  Administered 2020-05-12: 17:00:00 187 mg via INTRAVENOUS

## 2020-05-12 MED ORDER — HYDROMORPHONE (PF) 2 MG/ML IJ SYRG
INTRAVENOUS | 0 refills | Status: DC
Start: 2020-05-12 — End: 2020-05-12
  Administered 2020-05-12 (×4): .5 mg via INTRAVENOUS

## 2020-05-12 MED ORDER — DEXAMETHASONE SODIUM PHOSPHATE 4 MG/ML IJ SOLN
INTRAVENOUS | 0 refills | Status: DC
Start: 2020-05-12 — End: 2020-05-12
  Administered 2020-05-12: 16:00:00 4 mg via INTRAVENOUS

## 2020-05-12 MED ORDER — NALOXONE 0.4 MG/ML IJ SOLN
.08 mg | INTRAVENOUS | 0 refills | Status: AC | PRN
Start: 2020-05-12 — End: ?

## 2020-05-12 MED ORDER — ONDANSETRON 4 MG PO TBDI
4 mg | ORAL | 0 refills | Status: DC | PRN
Start: 2020-05-12 — End: 2020-05-12

## 2020-05-12 MED ORDER — LIDOCAINE (PF) 200 MG/10 ML (2 %) IJ SYRG
INTRAVENOUS | 0 refills | Status: DC
Start: 2020-05-12 — End: 2020-05-12
  Administered 2020-05-12: 16:00:00 80 mg via INTRAVENOUS

## 2020-05-12 MED ORDER — METHOCARBAMOL IVPB
1000 mg | INTRAVENOUS | 0 refills | Status: AC
Start: 2020-05-12 — End: ?
  Administered 2020-05-12 – 2020-05-15 (×18): 1000 mg via INTRAVENOUS

## 2020-05-12 MED ORDER — ARTIFICIAL TEARS SINGLE DOSE DROPS GROUP
OPHTHALMIC | 0 refills | Status: DC
Start: 2020-05-12 — End: 2020-05-12
  Administered 2020-05-12: 16:00:00 2 [drp] via OPHTHALMIC

## 2020-05-12 MED ORDER — CEFOXITIN 2 GRAM IV SOLR
INTRAVENOUS | 0 refills | Status: DC
Start: 2020-05-12 — End: 2020-05-12
  Administered 2020-05-12: 16:00:00 2 g via INTRAVENOUS

## 2020-05-12 MED ORDER — SODIUM CHLORIDE 0.9 % IV SOLP
INTRAVENOUS | 0 refills | Status: DC
Start: 2020-05-12 — End: 2020-05-12

## 2020-05-12 MED ORDER — ACETAMINOPHEN 1,000 MG/100 ML (10 MG/ML) IV SOLN
1000 mg | INTRAVENOUS | 0 refills | Status: AC
Start: 2020-05-12 — End: ?
  Administered 2020-05-12 – 2020-05-15 (×10): 1000 mg via INTRAVENOUS

## 2020-05-12 MED ADMIN — SODIUM CHLORIDE 0.9 % IV SOLP [27838]: INTRAVENOUS | @ 16:00:00 | Stop: 2020-05-12 | NDC 00338004904

## 2020-05-12 MED ADMIN — SODIUM CHLORIDE 0.9 % IV SOLP [27838]: 100 mL | INTRAVENOUS | @ 19:00:00 | Stop: 2020-05-12 | NDC 00338004948

## 2020-05-12 NOTE — Anesthesia Procedure Notes
Anesthesia Procedure: Peripheral Nerve Block    PERIPHERAL NERVE BLOCK    Date/Time: 05/12/2020 12:10 PM    Patient location: ICU  Reason for block: at surgeon's request and post-op pain management    Preprocedure checklist performed: 2 patient identifiers, risks & benefits discussed, patient evaluated, timeout performed, consent obtained, patient being monitored and sterile drape    Sterile technique:  - Proper hand washing  - Cap, mask  - Sterile gloves  - Skin prep for antisepsis        Peripheral Nerve Block Procedure   Patient position: supine  Prep: ChloraPrep    Monitoring: BP, EKG and continuous pulse ox  Block type: TAPs  Laterality: bilateral  Injection technique: single-shot  Procedures: ultrasound guided    Ultrasound image captured    Needle/cathether:      Needle type: Stimuplex      Needle gauge: 22 G; Needle length: 4 in     Needle location: anatomical landmarks and ultrasound guidance    Procedure Outcome   Injection assessment: negative aspiration for heme, no paresthesia on injection, incremental injection and local visualized surrounding nerve on ultrasound  Observations: adequate block, patient sedated but conversant throughout block, patient tolerated the procedure well with no immediate complications and comfortable throughout block    Additional notes: I was present during the entire procedure performed by a resident.    Refer to nursing documentation for vitals and monitoring data during procedure.    Performed by: Ubaldo Glassing, MD  Authorized by: Ubaldo Glassing, MD

## 2020-05-12 NOTE — Other
Brief Operative Note    Name: Kenneth Robles is a 87 y.o. male     DOB: 06/16/1930             MRN#: [id number]  DATE OF OPERATION: 05/12/2020    Date:  05/12/2020        Preoperative Dx:   Small bowel obstruction ([facility]) [K56.609]    Post-op Diagnosis      * Small bowel obstruction ([place]) [B02.111]    Procedure(s):  EXPLORATORY LAPAROTOMY    Surgeon(s) and Role:     Reyne Dumas, MD - Primary     * Rod Holler, DO - Resident - Assisting      Findings:  Diffuse spotty ischemia throughout the small bowel. No major adhesions. Abdomen left open and abthera device placed.    Estimated Blood Loss: Minimal    Specimen(s) Removed/Disposition: * No specimens in log *    Complications:  None    Implants: None    Drains: Foley Catheter: 500 mL and Nasogastric tube: 500 mL    Disposition:  ICU - stable    Rod Holler, DO  Pager [phone]

## 2020-05-12 NOTE — Anesthesia Post-Procedure Evaluation
Post-Anesthesia Evaluation    Name: Kenneth Robles      MRN: [id number]     DOB: 23-Nov-1930     Age: 87 y.o.     Sex: male   __________________________________________________________________________     Procedure Information     Anesthesia Start Date/Time: 05/12/20 1044    Procedure: EXPLORATORY LAPAROTOMY (N/A Abdomen)    Location: MAIN OR 12 / Main OR/Periop    Surgeons: Reyne Dumas, MD          Post-Anesthesia Vitals  BP: 163/93 (04/16 1215)  Temp: 36.5 C (97.7 F) (04/16 1200)  Pulse: 66 (04/16 1230)  Respirations: 19 PER MINUTE (04/16 1230)  SpO2: 94 % (04/16 1230)  O2 Device: Nasal cannula (04/16 1230)  ABP: 161/44 (04/16 1156)   Vitals Value Taken Time   BP 163/93 05/12/20 1215   Temp 36.5 C (97.7 F) 05/12/20 1200   Pulse 66 05/12/20 1230   Respirations 19 PER MINUTE 05/12/20 1230   SpO2 94 % 05/12/20 1230   O2 Device Nasal cannula 05/12/20 1230   ABP     ART BP           Post Anesthesia Evaluation Note    Evaluation location: ICU  Patient participation: recovered; patient participated in evaluation  Level of consciousness: alert    Pain score: 10 (Block consent based on change of plans, performed in ICU with wife consent)  Pain management: adequate    Hydration: normovolemia  Temperature: 36.0C - 38.4C  Airway patency: adequate (extubated)    Regional/Neuraxial:       Neurological status: sensory deficit and motor deficit      Single injection shot performed    Perioperative Events       Post-op nausea and vomiting: no PONV    Postoperative Status  Cardiovascular status: hemodynamically stable  Respiratory status: spontaneous ventilation  Follow-up needed: none  ICU Information          Staff involved in transport include: anesthesiologist, CRNA, OR nurse and surg resident      Perioperative Events  There were no known complications for this encounter.

## 2020-05-12 NOTE — Operative Report(Direct Entry)
OPERATIVE REPORT    Name: Kenneth Robles is a 87 y.o. male     DOB: [birth year]             MRN#: [id number]    DATE OF OPERATION: 05/12/2020    Surgeon(s) and Role:     Robbi Garter, MD - Primary     * Maida Sale, DO - Resident - Assisting        Preoperative Diagnosis:    Small bowel obstruction (HCC) [K56.609]    Post-op Diagnosis      * Small bowel obstruction (HCC) [K56.609]    Procedure(s):  EXPLORATORY LAPAROTOMY    Indication for procedure:  87 year old male with a history of CHF, CAD, MI, A. fib on warfarin, hypertension, gastric ulcer, IBD, who presented in transfer for a small bowel obstruction.  After review of the imaging and his clinical exam we recommended exploration.  The patient agreed and verbalized insightful understanding of the risks and benefits.  Informed consent was provided.    Findings:  Diffusely spotty ischemia throughout the entirety of the small bowel.  No major adhesions.  Areas of induration and serosal tear of likely loosely adherent adhesions that were displaced with running of the small bowel.  The entirety of the small bowel was run from the ligament of Treitz to the ileocecal valve noting diffuse ischemia throughout the small bowel mesentery.  Wound was left open and ABThera device placed.    Description and Findings of Operative Procedure:   After informed consent provided the patient was taken to the operating room and laid in the supine position.  After smooth induction of general endotracheal intubation the patient's abdomen was prepped and draped in the usual sterile fashion.  Next, a timeout was called confirming correct patient, procedure, any special equipment, perioperative antibiotics, DVT prophylaxis, SCDs and everybody in the room was in agreement.  A periumbilical incision was made with a #10 blade.  Using Bovie electrocautery the subcutaneous tissue was dissected down to the midline fascia.  The midline fascia was incised with Bovie electrocautery and the peritoneal cavity was entered.  No gross injuries were noted.  Periumbilical midline incision was opened up in its entirety.  There was dilated spotty ischemic bowel on entry.  The incision was further opened up caudad and cephalad to allow for adequate visualization.  A Bookwalter retractor was brought in the field for adequate retraction and visualization.  The small bowel was then run until the ligament of Treitz was identified.  Once ligament of Treitz was identified the bowel was run from ligament of Treitz to the ileocecal valve.  Throughout the entirety of the small bowel there was diffuse spotty ischemic changes to the small bowel.  The small bowel did peristalsis.  The colon was also dilated throughout to the mid distal transverse colon.  However this colon appeared healthy given the diffuse body ischemia and dilation with no obvious major adhesions that required lysis patient's abdomen was temporarily closed with an ABThera wound device that was brought into the field.  Patient was extubated and taken to the ICU in a stable condition.  Plan for further resuscitation and take back in approximate 24 to 48 hours.  All counts were correct at the end of the case x2.  Dr. Allyson Sabal was present for all key portions of the procedure.  Again, the patient was awoken and taken to the ICU in stable condition.    Estimated Blood Loss:  Minimal  Specimen(s) Removed/Disposition: * No specimens in log *    Complications:  None    Implants: None    Drains: Foley Catheter: 500 mL and Nasogastric tube: 500 mL    Disposition:  PACU - stable    Maida Sale, DO  Pager [phone]

## 2020-05-12 NOTE — H&P (View-Only)
[facility] History and Physical     Patient: Kenneth Robles, [id number]  Admission Date:  05/12/2020, LOS: 0 days  Admission Diagnosis: SBO (small bowel obstruction) (HCC) [K56.609]  Date of Service: May 12, 2020    ASSESSMENT:   Kenneth Robles is a 87 y.o. male with CHF, CAD with h/o MI, A.fib on warfarin,  HTN, gastric ulcer, IBD, skin cancer, purpura, transferred with concern for SBO    PLAN:  - NPO w/ mIVF  - fentanyl prn pain  - stat labs including INR for warfarin use  - NGT to LIS  - discussed need for surgery in the setting of possible closed loop obstruction seen on CT.   - This procedure including ex lap, possible bowel resection, possible ostomy, possible wound vac and all other indicated procedures was discussed with patient and wife, and written informed consent has been obtained. Increased bleeding risk 2/2 warfarin, discussed giving platelets prior to surgery.   - 4 U FFP ordered, administer 2 U now and hold 2 U  - 2U RBC held, TnC  - Continue inpatient care      Discussed with staff surgeon, Dr. Roderic Scarce.    Clarisa Schools, MD  Service Pager: [phone]  _____________________________________________________________________________    HPI: Kenneth Robles is a 87 y.o. male with CHF, CAD with a history of MI, afib on warfarin, HTN, gastric/duodenal ulcer, IBD, who was transferred with concern for SBO. Pt presented yesterday to an OSH with complaint of 1 day of abdominal pain and bloating. He had bloating and discomfort all evening and pain to his mid abdomen. This pain has resolved slightly nad he is passing gas and having bowel movements. He denies N/V. His last dose of warfarin which he takes for afib was last night. Pt was noted at OSH to be HDS. Labs notable for WBC of 13 and INR of 2.3. CT a/p was obtained which demonstrated SBO with transition point in the RLQ. NGT was placed prior to transfer and placement fwas confirmed with KUB upon arrival. Labs pending today.    Surgical history of lap chole, lap hiatal hernia repair, inguinal hernia repair.   Medical history MI many years ago s/p PCI and other listed below.   Patient functionally denies the ability to walk up a flight of stairs without stopping to catch his breath and rest his legs. He cannot wlak 2 blocks without stopping.       Medical History:   Diagnosis Date   ? Actinic keratosis    ? CAD (coronary artery disease) 07/05/2009   ? Cataract    ? Degenerative disc disease    ? Elevated PSA    ? Essential hypertension, benign    ? Heart attack (HCC)    ? Hyperlipidemia    ? Inflammatory bowel disease    ? Melanotic macule of labia    ? Purpura (HCC)     Bateman's purpura   ? Rheumatoid arthritis(714.0) 03/09/2014   ? Skin lesion     lentigenes   ? Squamous cell carcinoma    ? Ulcer of the stomach and intestine    ? Varicose vein    ? Warts    ? Xerosis of skin      Surgical History:   Procedure Laterality Date   ? SKIN BIOPSY  11/26/09    at [facility] on left upper lip   ? Generator Change Permanent Pacemaker N/A 06/12/2014    Performed by Dendi,  Raghuveer, MD at [facility] CATH LAB   ? ESOPHAGOGASTRODUODENOSCOPY WITH BIOPSY - FLEXIBLE N/A 04/20/2018    Performed by Buckles, Vinnie Level, MD at [facility] ENDO   ? COLONOSCOPY WITH BIOPSY - FLEXIBLE N/A 04/20/2018    Performed by Buckles, Vinnie Level, MD at [facility] ENDO   ? HX HERNIA REPAIR     ? HX LUMBAR LAMINECTOMY     ? HX VASECTOMY       Family History   Problem Relation Age of Onset   ? Heart Failure Mother    ? Hypertension Mother    ? Heart Attack Father    ? Heart Failure Father    ? Asthma Father    ? Heart Attack Brother    ? Hypertension Brother    ? Asthma Brother    ? High Cholesterol Brother    ? Cancer Brother    ? Heart Attack Maternal Uncle    ? Cancer Maternal Uncle    ? Heart Attack Paternal Aunt      Social History     Tobacco Use   ? Smoking status: Never Smoker   ? Smokeless tobacco: Never Used   Substance Use Topics   ? Alcohol use: No     Alcohol/week: 0.0 standard drinks Your Current Medications:       Instructions    acetaminophen (TYLENOL) 500 mg tablet Take 500 mg by mouth twice daily. Max of 4,000 mg of acetaminophen in 24 hours.    allopurinol (ZYLOPRIM) 100 mg tablet Take 1 Tab by mouth Daily.    aspirin EC 81 mg tablet Take 81 mg by mouth daily. Take with food.    atorvastatin (LIPITOR) 20 mg tablet Take one tablet by mouth daily.    finasteride (PROSCAR) 5 mg tablet Take 5 mg by mouth daily.    gabapentin (NEURONTIN) 600 mg tablet Take 600 mg by mouth three times daily.    hydroxychloroquine (PLAQUENIL) 200 mg PO tablet Take 200 mg by mouth twice daily.    hyoscyamine SR (LEVBID) 0.375 mg tablet Take 0.375 mg by mouth twice daily.    ketoconazole (NIZORAL) 2 % topical cream Apply  topically to affected area twice daily.    ketoconazole (NIZORAL) 2 % topical shampoo Apply  topically to affected area three times weekly. Apply topically to affected area of damp skin, lather, leave on 5 minutes, and rinse. Repeat 2-3 times per week.    levocetirizine 5 mg tab Take 1 tablet by mouth daily.    methylcellulose (CITRUCEL PO) Take  by mouth daily.    metoprolol XL (TOPROL XL) 25 mg extended release tablet Take one tablet by mouth daily.    montelukast (SINGULAIR) 10 mg tablet Take 10 mg by mouth daily.    nitroglycerin (NITROSTAT) 0.4 mg tablet DISSOLVE ONE TABLET UNDER THE TONGUE EVERY 5 MINUTES AS NEEDED FOR CHEST PAIN.  DO NOT EXCEED A TOTAL OF 3 DOSES IN 15 MINUTES    polyethylene glycol 3350 (MIRALAX) 17 g packet Take 17 g by mouth daily. Indications: constipation    primidone (MYSOLINE) 50 mg tablet Take 3 tablets by mouth at bedtime daily.    psyllium seed (with dextrose) (FIBER PO) Take 1,250 mg by mouth daily.    terbinafine HCL (LAMISIL) 250 mg tablet     triamcinolone acetonide (KENALOG) 0.1 % topical ointment Apply  topically to affected area twice daily.    warfarin (COUMADIN) 1 mg tablet Take one tablet to three tablets by mouth daily. As  directed by cardiology warfarin (COUMADIN) 5 mg tablet As directed          Review of Systems   Constitutional: Positive for malaise/fatigue. Negative for chills and fever.   HENT: Negative.    Eyes: Negative for double vision.   Respiratory: Negative for cough and hemoptysis.    Cardiovascular: Negative for chest pain.   Gastrointestinal: Positive for abdominal pain and nausea. Negative for blood in stool, constipation, diarrhea, melena and vomiting.   Genitourinary: Negative.    Musculoskeletal: Negative for myalgias.   Skin: Negative for rash.   Neurological: Negative for dizziness.   Psychiatric/Behavioral: Negative for depression.          Vitals:  BP: (141-145)/(74-92)   Temp:  [36.5 ?C (97.7 ?F)-36.8 ?C (98.2 ?F)]   Pulse:  [57-72]   Respirations:  [16 PER MINUTE-17 PER MINUTE]   SpO2:  [94 %-100 %]   O2 Device: None (Room air)  Body mass index is 26.45 kg/m?[name]    Physical Exam  Constitutional:       General: He is not in acute distress.     Appearance: Normal appearance.   HENT:      Head: Normocephalic and atraumatic.   Eyes:      General: No scleral icterus.  Cardiovascular:      Rate and Rhythm: Normal rate and regular rhythm.   Pulmonary:      Effort: Pulmonary effort is normal. No respiratory distress.   Abdominal:      General: There is distension (significant ).      Palpations: Abdomen is soft.      Tenderness: There is abdominal tenderness (epigastric/periumbilical). There is no guarding or rebound.      Comments: Well healed laparoscopic surgical scars   Musculoskeletal:         General: Normal range of motion.      Cervical back: Normal range of motion.   Skin:     General: Skin is warm and dry.   Neurological:      Mental Status: He is alert and oriented to person, place, and time.   Psychiatric:         Mood and Affect: Mood normal.         Behavior: Behavior normal.            Lab/Radiology/Other Diagnostic Tests:  Lab Results   Component Value Date/Time    NA 138 05/12/2020 07:43 AM    K 4.8 05/12/2020 07:43 AM    CL 104 05/12/2020 07:43 AM    CO2 26 05/12/2020 07:43 AM    BUN 16 05/12/2020 07:43 AM    CR 1.05 05/12/2020 07:43 AM    CA 8.9 05/12/2020 07:43 AM    MG 2.2 05/12/2020 07:43 AM    PO4 3.4 05/12/2020 07:43 AM       Lab Results   Component Value Date/Time    HGB 13.2 (L) 05/12/2020 07:43 AM    HCT 38.9 (L) 05/12/2020 07:43 AM    WBC 9.4 05/12/2020 07:43 AM    PLTCT 160 05/12/2020 07:43 AM    INR 2.5 05/03/2020 12:00 AM    INR 2.4 04/12/2020 12:00 AM       No results found for: GLUPOC         ABDOMEN AP ONLY    (Results Pending)       Malnutrition Details:  Active Wounds        Wounds 05/12/20 0616 Abrasion (Active)   05/12/20 0616    Wound Type: Abrasion   Pressure Injury Stages (For Pressure Injury Wound Type Only):    Pressure Injury Present On Inpatient Admission:    If this pressure injury is suspected to be device related, please select the device::    Wound/Pressure Injury Orientation:    Wound Location Comments:    ZXWound Location:    Wound Description (Comments):    Wound Type::    Number of days: 0       Wounds 05/12/20 0616 Abrasion (Active)   05/12/20 0616    Wound Type: Abrasion   Pressure Injury Stages (For Pressure Injury Wound Type Only):    Pressure Injury Present On Inpatient Admission:    If this pressure injury is suspected to be device related, please select the device::    Wound/Pressure Injury Orientation:    Wound Location Comments:    ZXWound Location:    Wound Description (Comments):    Wound Type::    Number of days: 0

## 2020-05-12 NOTE — Progress Notes
RT Adult Assessment Note    NAME:Kenneth Robles             MRN: [id number]             DOB:November 25, 1930          AGE: 87 y.o.  ADMISSION DATE: 05/12/2020             DAYS ADMITTED: LOS: 0 days    RT Treatment Plan:       Protocol Plan: Procedures  PAP: Place a nursing order for "IS Q1h While Awake" for any of Lung Expansion indicators  Oxygen/Humidity: O2 to keep SpO2 > 92%, if not on any RT modality, D/C protocol if greater than 24 hours on room air  SpO2: BID & PRN    Additional Comments:  Impressions of the patient: resting comfortably  Intervention(s)/outcome(s): O2 checks for after surgery and IS  Patient education that was completed: none  Recommendations to the care team: none    Vital Signs:  Pulse: 67  RR: 16 PER MINUTE  SpO2: 97 %  O2 Device: None (Room air)  Liter Flow:    O2%:    Breath Sounds: Clear (Implies normal)  Respiratory Effort: Non-Labored

## 2020-05-12 NOTE — Care Plan
Pt. Admitted to unit from outside hospital. Pt. On unit till taken to surgery and is now recovering in SICU with closer observation. Family notified of transfer.

## 2020-05-12 NOTE — Progress Notes
The patient has a a one day history of abdominal pain and bloating.  CT abd/pelvis- extensive Small bowel obstruction in the mid small bowel- requested images be clouded  AAOX3  107/67 60 16 96% on RA 98.2  WBC-13  INR-2.3 on Coumadin  Other labs WNL  Lactate-3.1-->1 L IVF  TX: dilaudid; promethazine; placing NG tube  HX: CAD; CHF; A-fib; pacemaker; previous SBO

## 2020-05-12 NOTE — Progress Notes
1st bag of plasma was  Completed @1027  and the 2nd bag of plasma was initiated @1028 . CRNA verified with this nurse.

## 2020-05-13 ENCOUNTER — Encounter: Admit: 2020-05-13 | Discharge: 2020-05-13 | Payer: MEDICARE

## 2020-05-13 ENCOUNTER — Inpatient Hospital Stay: Admit: 2020-05-13 | Discharge: 2020-05-13 | Payer: MEDICARE

## 2020-05-13 MED ADMIN — MAGNESIUM SULFATE IN D5W 1 GRAM/100 ML IV PGBK [166578]: 1 g | INTRAVENOUS | @ 13:00:00 | Stop: 2020-05-13 | NDC 00338170940

## 2020-05-13 MED ADMIN — DEXTROSE 5%-0.45% SODIUM CHLORIDE IV SOLP [15861]: 1000.000 mL | INTRAVENOUS | @ 15:00:00 | NDC 00338008504

## 2020-05-14 ENCOUNTER — Encounter: Admit: 2020-05-14 | Discharge: 2020-05-14 | Payer: MEDICARE

## 2020-05-14 ENCOUNTER — Inpatient Hospital Stay: Admit: 2020-05-14 | Discharge: 2020-05-14 | Payer: MEDICARE

## 2020-05-14 DIAGNOSIS — E785 Hyperlipidemia, unspecified: Secondary | ICD-10-CM

## 2020-05-14 DIAGNOSIS — K529 Noninfective gastroenteritis and colitis, unspecified: Secondary | ICD-10-CM

## 2020-05-14 DIAGNOSIS — M069 Rheumatoid arthritis, unspecified: Secondary | ICD-10-CM

## 2020-05-14 DIAGNOSIS — B079 Viral wart, unspecified: Secondary | ICD-10-CM

## 2020-05-14 DIAGNOSIS — IMO0002 Squamous cell carcinoma: Secondary | ICD-10-CM

## 2020-05-14 DIAGNOSIS — D692 Other nonthrombocytopenic purpura: Secondary | ICD-10-CM

## 2020-05-14 DIAGNOSIS — L814 Other melanin hyperpigmentation: Secondary | ICD-10-CM

## 2020-05-14 DIAGNOSIS — L570 Actinic keratosis: Secondary | ICD-10-CM

## 2020-05-14 DIAGNOSIS — I8390 Asymptomatic varicose veins of unspecified lower extremity: Secondary | ICD-10-CM

## 2020-05-14 DIAGNOSIS — I2510 Atherosclerotic heart disease of native coronary artery without angina pectoris: Secondary | ICD-10-CM

## 2020-05-14 DIAGNOSIS — L853 Xerosis cutis: Secondary | ICD-10-CM

## 2020-05-14 DIAGNOSIS — L989 Disorder of the skin and subcutaneous tissue, unspecified: Secondary | ICD-10-CM

## 2020-05-14 DIAGNOSIS — R972 Elevated prostate specific antigen [PSA]: Secondary | ICD-10-CM

## 2020-05-14 DIAGNOSIS — I10 Essential (primary) hypertension: Secondary | ICD-10-CM

## 2020-05-14 DIAGNOSIS — K289 Gastrojejunal ulcer, unspecified as acute or chronic, without hemorrhage or perforation: Secondary | ICD-10-CM

## 2020-05-14 DIAGNOSIS — I219 Acute myocardial infarction, unspecified: Secondary | ICD-10-CM

## 2020-05-14 MED ORDER — PROPOFOL INJ 10 MG/ML IV VIAL
INTRAVENOUS | 0 refills | Status: DC
Start: 2020-05-14 — End: 2020-05-14
  Administered 2020-05-14: 15:00:00 100 mg via INTRAVENOUS

## 2020-05-14 MED ORDER — SUCCINYLCHOLINE CHLORIDE 20 MG/ML IJ SOLN
INTRAVENOUS | 0 refills | Status: DC
Start: 2020-05-14 — End: 2020-05-14
  Administered 2020-05-14: 15:00:00 120 mg via INTRAVENOUS

## 2020-05-14 MED ORDER — DIPHENHYDRAMINE HCL 50 MG/ML IJ SOLN
INTRAVENOUS | 0 refills | Status: DC
Start: 2020-05-14 — End: 2020-05-14
  Administered 2020-05-14: 16:00:00 12.5 mg via INTRAVENOUS

## 2020-05-14 MED ORDER — CEFOXITIN 2 GRAM IV SOLR
INTRAVENOUS | 0 refills | Status: DC
Start: 2020-05-14 — End: 2020-05-14
  Administered 2020-05-14: 15:00:00 2 g via INTRAVENOUS

## 2020-05-14 MED ORDER — LACTATED RINGERS IV SOLP
INTRAVENOUS | 0 refills | Status: DC
Start: 2020-05-14 — End: 2020-05-14
  Administered 2020-05-14: 15:00:00 via INTRAVENOUS

## 2020-05-14 MED ORDER — ROCURONIUM 10 MG/ML IV SOLN
INTRAVENOUS | 0 refills | Status: DC
Start: 2020-05-14 — End: 2020-05-14
  Administered 2020-05-14: 15:00:00 40 mg via INTRAVENOUS

## 2020-05-14 MED ORDER — ARTIFICIAL TEARS SINGLE DOSE DROPS GROUP
OPHTHALMIC | 0 refills | Status: DC
Start: 2020-05-14 — End: 2020-05-14
  Administered 2020-05-14: 15:00:00 2 [drp] via OPHTHALMIC

## 2020-05-14 MED ORDER — LIDOCAINE (PF) 200 MG/10 ML (2 %) IJ SYRG
INTRAVENOUS | 0 refills | Status: DC
Start: 2020-05-14 — End: 2020-05-14
  Administered 2020-05-14: 15:00:00 100 mg via INTRAVENOUS

## 2020-05-14 MED ORDER — FENTANYL CITRATE (PF) 50 MCG/ML IJ SOLN
INTRAVENOUS | 0 refills | Status: DC
Start: 2020-05-14 — End: 2020-05-14
  Administered 2020-05-14: 16:00:00 50 ug via INTRAVENOUS
  Administered 2020-05-14: 15:00:00 100 ug via INTRAVENOUS
  Administered 2020-05-14: 16:00:00 50 ug via INTRAVENOUS

## 2020-05-14 MED ORDER — SUGAMMADEX 100 MG/ML IV SOLN
INTRAVENOUS | 0 refills | Status: DC
Start: 2020-05-14 — End: 2020-05-14
  Administered 2020-05-14: 16:00:00 200 mg via INTRAVENOUS

## 2020-05-14 MED ORDER — ELECTROLYTE-A IV SOLP
INTRAVENOUS | 0 refills | Status: DC
Start: 2020-05-14 — End: 2020-05-14
  Administered 2020-05-14: 15:00:00 via INTRAVENOUS

## 2020-05-14 MED ORDER — DEXAMETHASONE SODIUM PHOSPHATE 4 MG/ML IJ SOLN
INTRAVENOUS | 0 refills | Status: DC
Start: 2020-05-14 — End: 2020-05-14
  Administered 2020-05-14: 15:00:00 4 mg via INTRAVENOUS

## 2020-05-14 MED ORDER — ACETAMINOPHEN 1,000 MG/100 ML (10 MG/ML) IV SOLN
INTRAVENOUS | 0 refills | Status: DC
Start: 2020-05-14 — End: 2020-05-14
  Administered 2020-05-14: 16:00:00 1000 mg via INTRAVENOUS

## 2020-05-14 MED ORDER — INDOCYANINE GREEN 25 MG IJ SOLR
INTRAVENOUS | 0 refills | Status: DC
Start: 2020-05-14 — End: 2020-05-14
  Administered 2020-05-14: 15:00:00 25 mg via INTRAVENOUS

## 2020-05-14 MED ORDER — ONDANSETRON HCL (PF) 4 MG/2 ML IJ SOLN
INTRAVENOUS | 0 refills | Status: DC
Start: 2020-05-14 — End: 2020-05-14
  Administered 2020-05-14: 16:00:00 4 mg via INTRAVENOUS

## 2020-05-14 MED ORDER — PHENYLEPHRINE HCL IN 0.9% NACL 1 MG/10 ML (100 MCG/ML) IV SYRG
INTRAVENOUS | 0 refills | Status: DC
Start: 2020-05-14 — End: 2020-05-14
  Administered 2020-05-14: 15:00:00 100 ug via INTRAVENOUS

## 2020-05-14 MED ADMIN — DEXTROSE 5%-0.45% SODIUM CHLORIDE IV SOLP [15861]: 1000.000 mL | INTRAVENOUS | @ 05:00:00 | NDC 00990792609

## 2020-05-14 MED ADMIN — DIPHENHYDRAMINE HCL 50 MG/ML IJ SOLN [2508]: 12.5 mg | INTRAVENOUS | @ 05:00:00 | Stop: 2020-05-14 | NDC 63323066400

## 2020-05-14 MED ADMIN — METOPROLOL TARTRATE 5 MG/5 ML IV SOLN [5007]: 2.5 mg | INTRAVENOUS | @ 21:00:00 | NDC 00409177815

## 2020-05-14 MED ADMIN — DEXTROSE 5%-0.45% SODIUM CHLORIDE IV SOLP [15861]: 1000.000 mL | INTRAVENOUS | @ 19:00:00 | NDC 00338008504

## 2020-05-14 MED ADMIN — MAGNESIUM SULFATE IN D5W 1 GRAM/100 ML IV PGBK [166578]: 1 g | INTRAVENOUS | @ 21:00:00 | Stop: 2020-05-15 | NDC 00338170940

## 2020-05-14 MED ADMIN — METOPROLOL TARTRATE 5 MG/5 ML IV SOLN [5007]: 2.5 mg | INTRAVENOUS | @ 16:00:00 | NDC 00409177815

## 2020-05-14 NOTE — Anesthesia Pre-Procedure Evaluation
Anesthesia Pre-Procedure Evaluation    Name: Kenneth Robles      MRN: [id number]     DOB: September 25, 1930     Age: 87 y.o.     Sex: male   _________________________________________________________________________     Procedure Info:   Procedure Information     Date/Time: 05/14/20 1016    Procedure: REOPENING RECENT LAPAROTOMY possible bowel resection (N/A )    Location: MAIN OR 06 / Main OR/Periop    Surgeons: Robbi Garter, MD          Physical Assessment  Vital Signs (last filed in past 24 hours):  BP: 138/65 (04/18 0900)  Temp: 36.6 ?C (97.9 ?F) (04/18 0800)  Pulse: 60 (04/18 0900)  Respirations: 17 PER MINUTE (04/18 0900)  SpO2: 94 % (04/18 0900)  O2 Device: None (Room air) (04/18 0900)      Patient History   Allergies   Allergen Reactions   ? Crestor [Rosuvastatin] MUSCLE PAIN   ? Lipitor [Atorvastatin] MUSCLE PAIN   ? Statins-Hmg-Coa Reductase Inhibitors MUSCLE PAIN   ? Zetia [Ezetimibe] MUSCLE PAIN   ? Zocor [Simvastatin] MUSCLE PAIN        Current Medications    Medication Directions   acetaminophen (TYLENOL) 500 mg tablet Take 500 mg by mouth twice daily. Max of 4,000 mg of acetaminophen in 24 hours.   allopurinol (ZYLOPRIM) 100 mg tablet Take 1 Tab by mouth Daily.   aspirin EC 81 mg tablet Take 81 mg by mouth daily. Take with food.   atorvastatin (LIPITOR) 20 mg tablet Take one tablet by mouth daily.   finasteride (PROSCAR) 5 mg tablet Take 5 mg by mouth daily.   gabapentin (NEURONTIN) 600 mg tablet Take 600 mg by mouth three times daily.   hydroxychloroquine (PLAQUENIL) 200 mg PO tablet Take 200 mg by mouth twice daily.   hyoscyamine SR (LEVBID) 0.375 mg tablet Take 0.375 mg by mouth twice daily.   ketoconazole (NIZORAL) 2 % topical cream Apply  topically to affected area twice daily.   ketoconazole (NIZORAL) 2 % topical shampoo Apply  topically to affected area three times weekly. Apply topically to affected area of damp skin, lather, leave on 5 minutes, and rinse. Repeat 2-3 times per week. levocetirizine 5 mg tab Take 1 tablet by mouth daily.   methylcellulose (CITRUCEL PO) Take  by mouth daily.   metoprolol XL (TOPROL XL) 25 mg extended release tablet Take one tablet by mouth daily.   montelukast (SINGULAIR) 10 mg tablet Take 10 mg by mouth daily.   nitroglycerin (NITROSTAT) 0.4 mg tablet DISSOLVE ONE TABLET UNDER THE TONGUE EVERY 5 MINUTES AS NEEDED FOR CHEST PAIN.  DO NOT EXCEED A TOTAL OF 3 DOSES IN 15 MINUTES   polyethylene glycol 3350 (MIRALAX) 17 g packet Take 17 g by mouth daily. Indications: constipation   primidone (MYSOLINE) 50 mg tablet Take 3 tablets by mouth at bedtime daily.   psyllium seed (with dextrose) (FIBER PO) Take 1,250 mg by mouth daily.   terbinafine HCL (LAMISIL) 250 mg tablet    triamcinolone acetonide (KENALOG) 0.1 % topical ointment Apply  topically to affected area twice daily.   warfarin (COUMADIN) 1 mg tablet Take one tablet to three tablets by mouth daily. As directed by cardiology   warfarin (COUMADIN) 5 mg tablet As directed         Review of Systems/Medical History        PONV Screening: Non-smoker  No history of anesthetic complications  No family  history of anesthetic complications      Airway - negative        Pulmonary       Not a current smoker      no COPD      No recent URI      Not on home oxygen      Sleep apnea (Pt states he may have OSA but never tested)      Cardiovascular         Exercise tolerance: <4 METS        CIED  : pacemaker              Medtronic              Device Indication(s):   SSS          not pacemaker dependent                         CIED interrogated last 12 months              Device settings:  DDDR              Battery life > 1 year              Magnet response: Asynchronous mode and 85 bpm                Hypertension,         Past MI, > 6 months      Coronary artery disease      PTCA (2011)        Dysrhythmias (warfarin); atrial fibrillation      CHF      Hyperlipidemia      SSS s/p PPM    Cath 2018  FINAL IMPRESSION:    1. Mild to moderate nonobstructive coronary artery disease,which appears to be unchanged from prior cath, as described above.  2. Patent LAD stent with mild in-stent restenosis  3. Normal LVEDP.  4. No significant gradient across the aortic valve on pullback.        GI/Hepatic/Renal       Inflammatory bowel disease      GERD,       No hx of liver disease     No renal disease      SBO    Gastric ulcer      Neuro/Psych       No seizures      No CVA      Musculoskeletal         Back pain      Arthritis      Endocrine/Other         Malignancy (hx prostate cancer)   Physical Exam    Airway Findings      Mallampati: III      TM distance: >3 FB      Neck ROM: full      Mouth opening: good      Airway patency: adequate    Dental Findings:       Upper dentures and lower dentures    Cardiovascular Findings: Negative      Rhythm: regular      Rate: normal      Comments: Paced    Pulmonary Findings:       Breath sounds clear to auscultation.      Comments: Distant breath sounds    Abdominal Findings:       Not obese  Neurological Findings: Negative      Alert and oriented x 3    Constitutional findings: Negative      No acute distress      Well-developed     ECHO 10/03/2016  Interpretation Summary    1. Normal left ventricular ejection fraction.  Left ventricular cavity mildly dilated with mild concentric hypertrophy.   2. LVEF= 60%.  3. Mild aortic regurgitation. No other valvular abnormalities noted.    4. Right ventricular function normal however size moderately dilated.  5. Pulmonary artery systolic pressure cannot be calculated from this study.  5. Compaired to previous ECHO on 10/11/2012, no major change noted.    Diagnostic Tests  Hematology:   Lab Results   Component Value Date    HGB 10.2 05/14/2020    HCT 30.5 05/14/2020    PLTCT 129 05/14/2020    WBC 8.4 05/14/2020    NEUT 71 03/11/2018    ANC 6.40 03/11/2018    ALC 1.50 03/11/2018    MONA 10 03/11/2018    AMC 0.90 03/11/2018    EOSA 1 03/11/2018    ABC 0.10 03/11/2018    MCV 95.4 05/14/2020    MCH 31.9 05/14/2020    MCHC 33.4 05/14/2020    MPV 9.1 05/14/2020    RDW 15.1 05/14/2020         General Chemistry:   Lab Results   Component Value Date    NA 137 05/14/2020    K 4.1 05/14/2020    CL 107 05/14/2020    CO2 22 05/14/2020    GAP 8 05/14/2020    BUN 11 05/14/2020    CR 0.88 05/14/2020    GLU 89 05/14/2020    CA 7.8 05/14/2020    ALBUMIN 3.8 05/12/2020    LACTIC 1.5 05/12/2020    OBSCA 0.95 05/12/2020    MG 1.9 05/14/2020    TOTBILI 0.3 05/12/2020    PO4 2.3 05/14/2020      Coagulation:   Lab Results   Component Value Date    PT 29.4 05/14/2020    PTT 34.8 05/14/2020    INR 2.6 05/14/2020    INR 2.4 04/12/2020         Anesthesia Plan    ASA score: 4   Plan: general and invasive monitoring  Induction method: intravenous  NPO status: acceptable      Informed Consent  Anesthetic plan and risks discussed with patient.  Use of blood products discussed with patient  Blood Consent: consented      Plan discussed with: anesthesiologist, resident and CRNA.

## 2020-05-15 ENCOUNTER — Encounter: Admit: 2020-05-15 | Discharge: 2020-05-15 | Payer: MEDICARE

## 2020-05-15 DIAGNOSIS — IMO0002 Squamous cell carcinoma: Secondary | ICD-10-CM

## 2020-05-15 DIAGNOSIS — K529 Noninfective gastroenteritis and colitis, unspecified: Secondary | ICD-10-CM

## 2020-05-15 DIAGNOSIS — L989 Disorder of the skin and subcutaneous tissue, unspecified: Secondary | ICD-10-CM

## 2020-05-15 DIAGNOSIS — L570 Actinic keratosis: Secondary | ICD-10-CM

## 2020-05-15 DIAGNOSIS — I219 Acute myocardial infarction, unspecified: Secondary | ICD-10-CM

## 2020-05-15 DIAGNOSIS — E785 Hyperlipidemia, unspecified: Secondary | ICD-10-CM

## 2020-05-15 DIAGNOSIS — I10 Essential (primary) hypertension: Secondary | ICD-10-CM

## 2020-05-15 DIAGNOSIS — I2510 Atherosclerotic heart disease of native coronary artery without angina pectoris: Secondary | ICD-10-CM

## 2020-05-15 DIAGNOSIS — L814 Other melanin hyperpigmentation: Secondary | ICD-10-CM

## 2020-05-15 DIAGNOSIS — R972 Elevated prostate specific antigen [PSA]: Secondary | ICD-10-CM

## 2020-05-15 DIAGNOSIS — B079 Viral wart, unspecified: Secondary | ICD-10-CM

## 2020-05-15 DIAGNOSIS — D692 Other nonthrombocytopenic purpura: Secondary | ICD-10-CM

## 2020-05-15 DIAGNOSIS — M069 Rheumatoid arthritis, unspecified: Secondary | ICD-10-CM

## 2020-05-15 DIAGNOSIS — K289 Gastrojejunal ulcer, unspecified as acute or chronic, without hemorrhage or perforation: Secondary | ICD-10-CM

## 2020-05-15 DIAGNOSIS — I8390 Asymptomatic varicose veins of unspecified lower extremity: Secondary | ICD-10-CM

## 2020-05-15 DIAGNOSIS — I200 Unstable angina: Secondary | ICD-10-CM

## 2020-05-15 DIAGNOSIS — L853 Xerosis cutis: Secondary | ICD-10-CM

## 2020-05-15 MED ADMIN — METOPROLOL TARTRATE 5 MG/5 ML IV SOLN [5007]: 2.5 mg | INTRAVENOUS | @ 14:00:00 | NDC 00409177815

## 2020-05-15 MED ADMIN — METOPROLOL TARTRATE 5 MG/5 ML IV SOLN [5007]: 2.5 mg | INTRAVENOUS | @ 03:00:00 | NDC 00409177815

## 2020-05-15 MED ADMIN — METOPROLOL TARTRATE 5 MG/5 ML IV SOLN [5007]: 2.5 mg | INTRAVENOUS | @ 09:00:00 | NDC 00409177815

## 2020-05-15 MED ADMIN — ACETAMINOPHEN 325 MG PO TAB [101]: 650 mg | ORAL | @ 21:00:00 | NDC 00904677361

## 2020-05-15 MED ADMIN — POLYETHYLENE GLYCOL 3350 17 GRAM PO PWPK [25424]: 17 g | ORAL | @ 17:00:00 | NDC 00904693186

## 2020-05-15 MED ADMIN — DIPHENHYDRAMINE HCL 50 MG/ML IJ SOLN [2508]: 25 mg | INTRAVENOUS | @ 07:00:00 | Stop: 2020-05-15 | NDC 63323066400

## 2020-05-15 MED ADMIN — ACETAMINOPHEN/LIDOCAINE/ANTACID DS(#) 1:1:3  PO SUSP [210000]: 30 mL | ORAL | @ 22:00:00 | Stop: 2020-05-15 | NDC 54029002209

## 2020-05-15 MED ADMIN — MONTELUKAST 10 MG PO TAB [81627]: 10 mg | ORAL | @ 17:00:00 | NDC 00904680861

## 2020-05-15 MED ADMIN — DEXTROSE 5%-0.45% SODIUM CHLORIDE IV SOLP [15861]: 1000.000 mL | INTRAVENOUS | @ 22:00:00 | NDC 00338008504

## 2020-05-15 MED ADMIN — METOPROLOL TARTRATE 5 MG/5 ML IV SOLN [5007]: 2.5 mg | INTRAVENOUS | @ 21:00:00 | NDC 00409177815

## 2020-05-15 MED ADMIN — DEXTROSE 5%-0.45% SODIUM CHLORIDE IV SOLP [15861]: 1000.000 mL | INTRAVENOUS | @ 09:00:00 | NDC 00338008504

## 2020-05-15 MED ADMIN — MELATONIN 3 MG PO TAB [16830]: 3 mg | ORAL | @ 05:00:00 | NDC 50268052411

## 2020-05-16 ENCOUNTER — Inpatient Hospital Stay: Admit: 2020-05-16 | Discharge: 2020-05-16 | Payer: MEDICARE

## 2020-05-16 ENCOUNTER — Encounter: Admit: 2020-05-16 | Discharge: 2020-05-16 | Payer: MEDICARE

## 2020-05-16 DIAGNOSIS — I200 Unstable angina: Secondary | ICD-10-CM

## 2020-05-16 DIAGNOSIS — IMO0002 Squamous cell carcinoma: Secondary | ICD-10-CM

## 2020-05-16 DIAGNOSIS — I219 Acute myocardial infarction, unspecified: Secondary | ICD-10-CM

## 2020-05-16 DIAGNOSIS — I8390 Asymptomatic varicose veins of unspecified lower extremity: Secondary | ICD-10-CM

## 2020-05-16 DIAGNOSIS — E785 Hyperlipidemia, unspecified: Secondary | ICD-10-CM

## 2020-05-16 DIAGNOSIS — I2510 Atherosclerotic heart disease of native coronary artery without angina pectoris: Secondary | ICD-10-CM

## 2020-05-16 DIAGNOSIS — L989 Disorder of the skin and subcutaneous tissue, unspecified: Secondary | ICD-10-CM

## 2020-05-16 DIAGNOSIS — L814 Other melanin hyperpigmentation: Secondary | ICD-10-CM

## 2020-05-16 DIAGNOSIS — K289 Gastrojejunal ulcer, unspecified as acute or chronic, without hemorrhage or perforation: Secondary | ICD-10-CM

## 2020-05-16 DIAGNOSIS — I10 Essential (primary) hypertension: Secondary | ICD-10-CM

## 2020-05-16 DIAGNOSIS — K529 Noninfective gastroenteritis and colitis, unspecified: Secondary | ICD-10-CM

## 2020-05-16 DIAGNOSIS — M069 Rheumatoid arthritis, unspecified: Secondary | ICD-10-CM

## 2020-05-16 DIAGNOSIS — L853 Xerosis cutis: Secondary | ICD-10-CM

## 2020-05-16 DIAGNOSIS — D692 Other nonthrombocytopenic purpura: Secondary | ICD-10-CM

## 2020-05-16 DIAGNOSIS — R972 Elevated prostate specific antigen [PSA]: Secondary | ICD-10-CM

## 2020-05-16 DIAGNOSIS — B079 Viral wart, unspecified: Secondary | ICD-10-CM

## 2020-05-16 DIAGNOSIS — L570 Actinic keratosis: Secondary | ICD-10-CM

## 2020-05-16 MED ADMIN — METOPROLOL TARTRATE 5 MG/5 ML IV SOLN [5007]: 2.5 mg | INTRAVENOUS | @ 23:00:00 | NDC 00409177815

## 2020-05-16 MED ADMIN — METOPROLOL TARTRATE 5 MG/5 ML IV SOLN [5007]: 2.5 mg | INTRAVENOUS | @ 04:00:00 | NDC 00409177815

## 2020-05-16 MED ADMIN — DEXTROSE 5%-0.45% SODIUM CHLORIDE IV SOLP [15861]: 1000.000 mL | INTRAVENOUS | @ 08:00:00 | Stop: 2020-05-16 | NDC 00338008504

## 2020-05-16 MED ADMIN — DEXTROSE 5%-0.45% SODIUM CHLORIDE & POTASSIUM CHLORIDE 20 MEQ/L IV SOLP [14863]: 1000.000 mL | INTRAVENOUS | @ 15:00:00 | Stop: 2020-05-16 | NDC 00338067104

## 2020-05-16 MED ADMIN — ACETAMINOPHEN 325 MG PO TAB [101]: 650 mg | NASOGASTRIC | @ 23:00:00 | NDC 00904677361

## 2020-05-16 MED ADMIN — MONTELUKAST 10 MG PO TAB [81627]: 10 mg | ORAL | @ 15:00:00 | Stop: 2020-05-16 | NDC 00904680861

## 2020-05-16 MED ADMIN — METHOCARBAMOL 100 MG/ML IJ SOLN [4970]: 1000 mg | INTRAVENOUS | @ 08:00:00 | Stop: 2020-05-16 | NDC 70069010101

## 2020-05-16 MED ADMIN — DEXTROSE 5% IN WATER IV SOLP [2364]: 1000 mg | INTRAVENOUS | @ 08:00:00 | Stop: 2020-05-16 | NDC 00338001738

## 2020-05-16 MED ADMIN — METOPROLOL TARTRATE 5 MG/5 ML IV SOLN [5007]: 2.5 mg | INTRAVENOUS | @ 17:00:00 | NDC 00409177815

## 2020-05-16 MED ADMIN — OXYCODONE 5 MG/5 ML PO SOLN [10813]: 5 mg | NASOGASTRIC | @ 18:00:00 | NDC 00904682805

## 2020-05-16 MED ADMIN — METOPROLOL TARTRATE 5 MG/5 ML IV SOLN [5007]: 2.5 mg | INTRAVENOUS | @ 11:00:00 | NDC 00409177815

## 2020-05-16 MED ADMIN — POLYETHYLENE GLYCOL 3350 17 GRAM PO PWPK [25424]: 17 g | ORAL | @ 15:00:00 | Stop: 2020-05-16 | NDC 00904693186

## 2020-05-16 MED ADMIN — ACETAMINOPHEN 325 MG PO TAB [101]: 650 mg | ORAL | @ 15:00:00 | Stop: 2020-05-16 | NDC 00904677361

## 2020-05-17 MED ADMIN — METOPROLOL TARTRATE 5 MG/5 ML IV SOLN [5007]: 2.5 mg | INTRAVENOUS | @ 17:00:00 | NDC 00409177815

## 2020-05-17 MED ADMIN — METOPROLOL TARTRATE 5 MG/5 ML IV SOLN [5007]: 2.5 mg | INTRAVENOUS | @ 04:00:00 | NDC 00409177815

## 2020-05-17 MED ADMIN — OXYCODONE 5 MG/5 ML PO SOLN [10813]: 5 mg | NASOGASTRIC | @ 06:00:00 | NDC 00904682805

## 2020-05-17 MED ADMIN — OXYCODONE 5 MG/5 ML PO SOLN [10813]: 5 mg | NASOGASTRIC | @ 13:00:00 | NDC 00904682805

## 2020-05-17 MED ADMIN — ACETAMINOPHEN 325 MG PO TAB [101]: 650 mg | NASOGASTRIC | @ 02:00:00 | NDC 00904677361

## 2020-05-17 MED ADMIN — MONTELUKAST 10 MG PO TAB [81627]: 10 mg | NASOGASTRIC | @ 13:00:00 | Stop: 2020-05-17 | NDC 00904680861

## 2020-05-17 MED ADMIN — MELATONIN 3 MG PO TAB [16830]: 3 mg | NASOGASTRIC | @ 02:00:00 | NDC 50268052411

## 2020-05-17 MED ADMIN — ACETAMINOPHEN 160 MG/5 ML PO SOLN [100]: 650 mg | NASOGASTRIC | @ 13:00:00 | NDC 66689005601

## 2020-05-17 MED ADMIN — METOPROLOL TARTRATE 5 MG/5 ML IV SOLN [5007]: 2.5 mg | INTRAVENOUS | @ 11:00:00 | NDC 00409177815

## 2020-05-17 MED ADMIN — BISACODYL 10 MG RE SUPP [1080]: 10 mg | RECTAL | @ 13:00:00 | NDC 00574705012

## 2020-05-17 MED ADMIN — OXYCODONE 5 MG/5 ML PO SOLN [10813]: 5 mg | NASOGASTRIC | @ 01:00:00 | NDC 00904682805

## 2020-05-17 MED ADMIN — HEPARIN (PORCINE) IN 5 % DEX 20,000 UNIT/500 ML (40 UNIT/ML) IV SOLP [3628]: 905 [IU]/h | INTRAVENOUS | @ 11:00:00 | NDC 00264956710

## 2020-05-17 MED ADMIN — METOPROLOL TARTRATE 5 MG/5 ML IV SOLN [5007]: 2.5 mg | INTRAVENOUS | NDC 00409177815

## 2020-05-17 MED ADMIN — ADULT PN CONTINUOUS-ION BASED [212248]: 1800.000 mL | INTRAVENOUS | @ 02:00:00 | NDC 00338050206

## 2020-05-17 MED ADMIN — BISACODYL 10 MG RE SUPP [1080]: 10 mg | RECTAL | @ 02:00:00 | NDC 00574705012

## 2020-05-18 MED ADMIN — METOPROLOL TARTRATE 5 MG/5 ML IV SOLN [5007]: 2.5 mg | INTRAVENOUS | @ 05:00:00 | NDC 00409177815

## 2020-05-18 MED ADMIN — METOPROLOL TARTRATE 5 MG/5 ML IV SOLN [5007]: 2.5 mg | INTRAVENOUS | @ 16:00:00 | NDC 00409177815

## 2020-05-18 MED ADMIN — ACETAMINOPHEN 160 MG/5 ML PO SOLN [100]: 650 mg | NASOGASTRIC | @ 14:00:00 | NDC 66689005601

## 2020-05-18 MED ADMIN — HEPARIN (PORCINE) IN 5 % DEX 20,000 UNIT/500 ML (40 UNIT/ML) IV SOLP [3628]: 1505 [IU]/h | INTRAVENOUS | @ 07:00:00 | Stop: 2020-05-18 | NDC 00264956710

## 2020-05-18 MED ADMIN — ACETAMINOPHEN 160 MG/5 ML PO SOLN [100]: 650 mg | NASOGASTRIC | @ 02:00:00 | NDC 66689005601

## 2020-05-18 MED ADMIN — ADULT PN CONTINUOUS-ION BASED [212248]: 1800.000 mL | INTRAVENOUS | @ 02:00:00 | NDC 00338050206

## 2020-05-18 MED ADMIN — ENOXAPARIN 100 MG/ML SC SYRG [85051]: 90 mg | SUBCUTANEOUS | @ 19:00:00 | NDC 00781326805

## 2020-05-18 MED ADMIN — METOPROLOL TARTRATE 5 MG/5 ML IV SOLN [5007]: 2.5 mg | INTRAVENOUS | @ 22:00:00 | NDC 00409177815

## 2020-05-18 MED ADMIN — ACETAMINOPHEN 160 MG/5 ML PO SOLN [100]: 650 mg | NASOGASTRIC | @ 19:00:00 | NDC 66689005601

## 2020-05-18 MED ADMIN — BISACODYL 10 MG RE SUPP [1080]: 10 mg | RECTAL | @ 02:00:00 | NDC 00574705012

## 2020-05-18 MED ADMIN — MELATONIN 3 MG PO TAB [16830]: 3 mg | NASOGASTRIC | @ 02:00:00 | NDC 50268052411

## 2020-05-18 MED ADMIN — OXYCODONE 5 MG/5 ML PO SOLN [10813]: 5 mg | NASOGASTRIC | @ 02:00:00 | NDC 00904682805

## 2020-05-18 MED ADMIN — BISACODYL 10 MG RE SUPP [1080]: 10 mg | RECTAL | @ 14:00:00 | NDC 00574705012

## 2020-05-18 MED ADMIN — METOPROLOL TARTRATE 5 MG/5 ML IV SOLN [5007]: 2.5 mg | INTRAVENOUS | @ 11:00:00 | NDC 00409177815

## 2020-05-19 ENCOUNTER — Encounter: Admit: 2020-05-19 | Discharge: 2020-05-19 | Payer: MEDICARE

## 2020-05-19 ENCOUNTER — Inpatient Hospital Stay: Admit: 2020-05-19 | Discharge: 2020-05-19 | Payer: MEDICARE

## 2020-05-19 MED ORDER — IOHEXOL 350 MG IODINE/ML IV SOLN
100 mL | Freq: Once | INTRAVENOUS | 0 refills | Status: CP
Start: 2020-05-19 — End: ?
  Administered 2020-05-19: 16:00:00 100 mL via INTRAVENOUS

## 2020-05-19 MED ORDER — SODIUM CHLORIDE 0.9 % IJ SOLN
50 mL | Freq: Once | INTRAVENOUS | 0 refills | Status: CP
Start: 2020-05-19 — End: ?
  Administered 2020-05-19: 16:00:00 50 mL via INTRAVENOUS

## 2020-05-19 MED ADMIN — ADULT PN CONTINUOUS-ION BASED [212248]: 1800.000 mL | INTRAVENOUS | @ 02:00:00 | NDC 00338050206

## 2020-05-19 MED ADMIN — OXYCODONE 5 MG/5 ML PO SOLN [10813]: 5 mg | NASOGASTRIC | @ 14:00:00 | NDC 00904682805

## 2020-05-19 MED ADMIN — BISACODYL 10 MG RE SUPP [1080]: 10 mg | RECTAL | @ 02:00:00 | NDC 00574705012

## 2020-05-19 MED ADMIN — METOPROLOL TARTRATE 5 MG/5 ML IV SOLN [5007]: 2.5 mg | INTRAVENOUS | @ 10:00:00 | NDC 72611074001

## 2020-05-19 MED ADMIN — ACETAMINOPHEN 160 MG/5 ML PO SOLN [100]: 650 mg | NASOGASTRIC | @ 02:00:00 | NDC 66689005601

## 2020-05-19 MED ADMIN — LORAZEPAM 2 MG/ML IJ SYRG [86485]: 0.25 mg | INTRAVENOUS | @ 22:00:00 | Stop: 2020-05-19 | NDC 00409198503

## 2020-05-19 MED ADMIN — BISACODYL 10 MG RE SUPP [1080]: 10 mg | RECTAL | @ 14:00:00 | Stop: 2020-05-19 | NDC 00574705012

## 2020-05-19 MED ADMIN — ENOXAPARIN 100 MG/ML SC SYRG [85051]: 90 mg | SUBCUTANEOUS | @ 14:00:00 | NDC 00781326805

## 2020-05-19 MED ADMIN — FENTANYL CITRATE (PF) 50 MCG/ML IJ SOLN [3037]: 50 ug | INTRAVENOUS | @ 19:00:00 | NDC 00641602701

## 2020-05-19 MED ADMIN — METOPROLOL TARTRATE 5 MG/5 ML IV SOLN [5007]: 2.5 mg | INTRAVENOUS | @ 05:00:00 | NDC 72611074001

## 2020-05-19 MED ADMIN — LORAZEPAM 2 MG/ML IJ SYRG [86485]: 1 mg | INTRAVENOUS | @ 20:00:00 | Stop: 2020-05-19 | NDC 00409198503

## 2020-05-19 MED ADMIN — ENOXAPARIN 100 MG/ML SC SYRG [85051]: 90 mg | SUBCUTANEOUS | @ 02:00:00 | NDC 00781326805

## 2020-05-19 MED ADMIN — ACETAMINOPHEN 160 MG/5 ML PO SOLN [100]: 650 mg | NASOGASTRIC | @ 14:00:00 | NDC 66689005601

## 2020-05-19 MED ADMIN — METOPROLOL TARTRATE 5 MG/5 ML IV SOLN [5007]: 2.5 mg | INTRAVENOUS | @ 17:00:00 | NDC 00409177815

## 2020-05-20 ENCOUNTER — Inpatient Hospital Stay: Admit: 2020-05-20 | Discharge: 2020-05-20 | Payer: MEDICARE

## 2020-05-20 MED ADMIN — ENOXAPARIN 100 MG/ML SC SYRG [85051]: 90 mg | SUBCUTANEOUS | @ 16:00:00 | NDC 00781326805

## 2020-05-20 MED ADMIN — ENOXAPARIN 100 MG/ML SC SYRG [85051]: 90 mg | SUBCUTANEOUS | @ 02:00:00 | NDC 00781326805

## 2020-05-20 MED ADMIN — METOPROLOL TARTRATE 5 MG/5 ML IV SOLN [5007]: 2.5 mg | INTRAVENOUS | @ 02:00:00 | NDC 00409177815

## 2020-05-20 MED ADMIN — METOPROLOL TARTRATE 5 MG/5 ML IV SOLN [5007]: 2.5 mg | INTRAVENOUS | @ 10:00:00 | NDC 00409177815

## 2020-05-20 MED ADMIN — BISACODYL 10 MG RE SUPP [1080]: 10 mg | RECTAL | @ 21:00:00 | NDC 00574705012

## 2020-05-20 MED ADMIN — METOPROLOL TARTRATE 5 MG/5 ML IV SOLN [5007]: 2.5 mg | INTRAVENOUS | @ 16:00:00 | NDC 00409177815

## 2020-05-20 MED ADMIN — ADULT PN CONTINUOUS-ION BASED [212248]: 1800.000 mL | INTRAVENOUS | @ 02:00:00 | NDC 00338050206

## 2020-05-20 MED ADMIN — METOPROLOL TARTRATE 5 MG/5 ML IV SOLN [5007]: 2.5 mg | INTRAVENOUS | @ 23:00:00 | NDC 00409177815

## 2020-05-21 ENCOUNTER — Inpatient Hospital Stay: Admit: 2020-05-21 | Discharge: 2020-05-21 | Payer: MEDICARE

## 2020-05-21 MED ADMIN — METOPROLOL TARTRATE 5 MG/5 ML IV SOLN [5007]: 2.5 mg | INTRAVENOUS | @ 18:00:00 | NDC 00409177815

## 2020-05-21 MED ADMIN — METOPROLOL TARTRATE 5 MG/5 ML IV SOLN [5007]: 2.5 mg | INTRAVENOUS | @ 10:00:00 | NDC 00409177815

## 2020-05-21 MED ADMIN — BISACODYL 10 MG RE SUPP [1080]: 10 mg | RECTAL | @ 15:00:00 | Stop: 2020-05-21 | NDC 00574705012

## 2020-05-21 MED ADMIN — LIDOCAINE HCL 2 % MM SOLN [81336]: 1 mL | ORAL | @ 22:00:00 | NDC 60432046400

## 2020-05-21 MED ADMIN — METOPROLOL TARTRATE 5 MG/5 ML IV SOLN [5007]: 2.5 mg | INTRAVENOUS | @ 05:00:00 | NDC 00409177815

## 2020-05-21 MED ADMIN — ADULT PN CONTINUOUS-ION BASED [212248]: 1800.000 mL | INTRAVENOUS | @ 02:00:00 | NDC 00338050206

## 2020-05-21 MED ADMIN — METOPROLOL TARTRATE 5 MG/5 ML IV SOLN [5007]: 2.5 mg | INTRAVENOUS | @ 22:00:00 | NDC 00409177815

## 2020-05-21 MED ADMIN — ENOXAPARIN 100 MG/ML SC SYRG [85051]: 90 mg | SUBCUTANEOUS | @ 02:00:00 | NDC 00781326805

## 2020-05-21 MED ADMIN — ENOXAPARIN 100 MG/ML SC SYRG [85051]: 90 mg | SUBCUTANEOUS | @ 15:00:00 | NDC 00781326805

## 2020-05-22 ENCOUNTER — Encounter: Admit: 2020-05-22 | Discharge: 2020-05-22 | Payer: MEDICARE

## 2020-05-22 ENCOUNTER — Inpatient Hospital Stay: Admit: 2020-05-22 | Discharge: 2020-05-22 | Payer: MEDICARE

## 2020-05-22 MED ORDER — IOHEXOL 350 MG IODINE/ML IV SOLN
100 mL | Freq: Once | INTRAVENOUS | 0 refills | Status: CP
Start: 2020-05-22 — End: ?
  Administered 2020-05-23: 02:00:00 100 mL via INTRAVENOUS

## 2020-05-22 MED ORDER — SODIUM CHLORIDE 0.9 % IJ SOLN
50 mL | Freq: Once | INTRAVENOUS | 0 refills | Status: CP
Start: 2020-05-22 — End: ?
  Administered 2020-05-23: 02:00:00 50 mL via INTRAVENOUS

## 2020-05-22 MED ORDER — DIATRIZOATE MEG-DIATRIZOAT SOD 66-10 % PO SOLN
30 mL | Freq: Once | ORAL | 0 refills | Status: CP
Start: 2020-05-22 — End: ?
  Administered 2020-05-23: 01:00:00 30 mL via ORAL

## 2020-05-22 MED ADMIN — ADULT PN CONTINUOUS-ION BASED [212248]: 1800.000 mL | INTRAVENOUS | @ 03:00:00 | NDC 00338050206

## 2020-05-22 MED ADMIN — METOPROLOL TARTRATE 5 MG/5 ML IV SOLN [5007]: 2.5 mg | INTRAVENOUS | @ 22:00:00 | NDC 00409177815

## 2020-05-22 MED ADMIN — BISACODYL 10 MG RE SUPP [1080]: 10 mg | RECTAL | @ 15:00:00 | NDC 00574705012

## 2020-05-22 MED ADMIN — HYDRALAZINE 20 MG/ML IJ SOLN [3697]: 10 mg | INTRAVENOUS | @ 17:00:00 | NDC 67457029100

## 2020-05-22 MED ADMIN — ENOXAPARIN 100 MG/ML SC SYRG [85051]: 90 mg | SUBCUTANEOUS | @ 15:00:00 | NDC 00781326805

## 2020-05-22 MED ADMIN — METOPROLOL TARTRATE 5 MG/5 ML IV SOLN [5007]: 2.5 mg | INTRAVENOUS | @ 03:00:00 | NDC 00409177815

## 2020-05-22 MED ADMIN — METOPROLOL TARTRATE 5 MG/5 ML IV SOLN [5007]: 2.5 mg | INTRAVENOUS | @ 11:00:00 | NDC 36000003310

## 2020-05-22 MED ADMIN — ENOXAPARIN 100 MG/ML SC SYRG [85051]: 90 mg | SUBCUTANEOUS | @ 03:00:00 | NDC 00781326805

## 2020-05-22 MED ADMIN — BISACODYL 10 MG RE SUPP [1080]: 10 mg | RECTAL | @ 03:00:00 | NDC 00574705012

## 2020-05-22 MED ADMIN — METOPROLOL TARTRATE 5 MG/5 ML IV SOLN [5007]: 2.5 mg | INTRAVENOUS | @ 17:00:00 | NDC 00409177815

## 2020-05-23 MED ADMIN — ENOXAPARIN 100 MG/ML SC SYRG [85051]: 90 mg | SUBCUTANEOUS | @ 02:00:00 | NDC 00781326805

## 2020-05-23 MED ADMIN — METOPROLOL TARTRATE 5 MG/5 ML IV SOLN [5007]: 2.5 mg | INTRAVENOUS | @ 16:00:00 | NDC 00409177815

## 2020-05-23 MED ADMIN — ENOXAPARIN 100 MG/ML SC SYRG [85051]: 90 mg | SUBCUTANEOUS | @ 13:00:00 | NDC 00781326805

## 2020-05-23 MED ADMIN — ACETAMINOPHEN 160 MG/5 ML PO SOLN [100]: 650 mg | NASOGASTRIC | @ 13:00:00 | NDC 00121197121

## 2020-05-23 MED ADMIN — ACETAMINOPHEN 160 MG/5 ML PO SOLN [100]: 650 mg | NASOGASTRIC | @ 19:00:00 | NDC 66689005601

## 2020-05-23 MED ADMIN — CEFEPIME 2 GRAM IVP [213758]: 2 g | INTRAVENOUS | @ 19:00:00 | NDC 60505614700

## 2020-05-23 MED ADMIN — BISACODYL 10 MG RE SUPP [1080]: 10 mg | RECTAL | @ 17:00:00 | NDC 00574705012

## 2020-05-23 MED ADMIN — VANCOMYCIN 5 GRAM IV SOLR [8444]: 2000 mg | INTRAVENOUS | @ 11:00:00 | Stop: 2020-05-23 | NDC 70436002282

## 2020-05-23 MED ADMIN — SODIUM CHLORIDE 0.9 % IV SOLP [27838]: 2000 mg | INTRAVENOUS | @ 11:00:00 | Stop: 2020-05-23 | NDC 00338004902

## 2020-05-23 MED ADMIN — FENTANYL CITRATE (PF) 50 MCG/ML IJ SOLN [3037]: 25 ug | INTRAVENOUS | @ 02:00:00 | NDC 00409909412

## 2020-05-23 MED ADMIN — ADULT PN CONTINUOUS-ION BASED [212248]: 2280.000 mL | INTRAVENOUS | @ 02:00:00 | NDC 00338050206

## 2020-05-23 MED ADMIN — METOPROLOL TARTRATE 5 MG/5 ML IV SOLN [5007]: 2.5 mg | INTRAVENOUS | @ 05:00:00 | NDC 00409177815

## 2020-05-23 MED ADMIN — CEFEPIME 2 GRAM IVP [213758]: 2 g | INTRAVENOUS | @ 11:00:00 | NDC 60505614700

## 2020-05-23 MED ADMIN — METOPROLOL TARTRATE 5 MG/5 ML IV SOLN [5007]: 2.5 mg | INTRAVENOUS | @ 23:00:00 | NDC 00409177815

## 2020-05-23 MED ADMIN — METOPROLOL TARTRATE 5 MG/5 ML IV SOLN [5007]: 2.5 mg | INTRAVENOUS | @ 11:00:00 | NDC 00409177815

## 2020-05-24 ENCOUNTER — Inpatient Hospital Stay: Admit: 2020-05-24 | Discharge: 2020-05-24 | Payer: MEDICARE

## 2020-05-24 ENCOUNTER — Encounter: Admit: 2020-05-24 | Discharge: 2020-05-24 | Payer: MEDICARE

## 2020-05-24 MED ORDER — PHENYLEPHRINE HCL IN 0.9% NACL 1 MG/10 ML (100 MCG/ML) IV SYRG
INTRAVENOUS | 0 refills | Status: DC
Start: 2020-05-24 — End: 2020-05-24
  Administered 2020-05-24: 16:00:00 100 ug via INTRAVENOUS

## 2020-05-24 MED ORDER — SUCCINYLCHOLINE CHLORIDE 20 MG/ML IJ SOLN
INTRAVENOUS | 0 refills | Status: DC
Start: 2020-05-24 — End: 2020-05-24
  Administered 2020-05-24: 16:00:00 120 mg via INTRAVENOUS

## 2020-05-24 MED ORDER — ALBUMIN, HUMAN 5 % 250 ML IV SOLP (AN)(OSM)
INTRAVENOUS | 0 refills | Status: DC
Start: 2020-05-24 — End: 2020-05-24
  Administered 2020-05-24: 16:00:00 via INTRAVENOUS

## 2020-05-24 MED ORDER — SODIUM CHLORIDE 0.9 % IV SOLP
INTRAVENOUS | 0 refills | Status: DC
Start: 2020-05-24 — End: 2020-05-24
  Administered 2020-05-24: 15:00:00 via INTRAVENOUS

## 2020-05-24 MED ORDER — ARTIFICIAL TEARS SINGLE DOSE DROPS GROUP
OPHTHALMIC | 0 refills | Status: DC
Start: 2020-05-24 — End: 2020-05-24
  Administered 2020-05-24: 16:00:00 2 [drp] via OPHTHALMIC

## 2020-05-24 MED ORDER — ONDANSETRON HCL (PF) 4 MG/2 ML IJ SOLN
INTRAVENOUS | 0 refills | Status: DC
Start: 2020-05-24 — End: 2020-05-24
  Administered 2020-05-24: 17:00:00 4 mg via INTRAVENOUS

## 2020-05-24 MED ORDER — ROCURONIUM 10 MG/ML IV SOLN
INTRAVENOUS | 0 refills | Status: DC
Start: 2020-05-24 — End: 2020-05-24
  Administered 2020-05-24: 16:00:00 30 mg via INTRAVENOUS
  Administered 2020-05-24: 16:00:00 20 mg via INTRAVENOUS

## 2020-05-24 MED ORDER — PROPOFOL INJ 10 MG/ML IV VIAL
INTRAVENOUS | 0 refills | Status: DC
Start: 2020-05-24 — End: 2020-05-24
  Administered 2020-05-24: 16:00:00 60 mg via INTRAVENOUS

## 2020-05-24 MED ORDER — SUGAMMADEX 100 MG/ML IV SOLN
INTRAVENOUS | 0 refills | Status: DC
Start: 2020-05-24 — End: 2020-05-24
  Administered 2020-05-24: 17:00:00 200 mg via INTRAVENOUS

## 2020-05-24 MED ORDER — NOREPINEPHRINE IV DRIP STD CONC (AM)(OR)
INTRAVENOUS | 0 refills | Status: DC
Start: 2020-05-24 — End: 2020-05-24
  Administered 2020-05-24: 16:00:00 .03 ug/kg/min via INTRAVENOUS

## 2020-05-24 MED ADMIN — IOHEXOL 350 MG IODINE/ML IV SOLN [81210]: 100 mL | INTRAVENOUS | @ 13:00:00 | Stop: 2020-05-24 | NDC 00407141491

## 2020-05-24 MED ADMIN — SODIUM CHLORIDE 0.9 % IJ SOLN [7319]: 50 mL | INTRAVENOUS | @ 13:00:00 | Stop: 2020-05-24 | NDC 00409488820

## 2020-05-24 MED ADMIN — FENTANYL CITRATE (PF) 50 MCG/ML IJ SOLN [3037]: 25 ug | INTRAVENOUS | @ 19:00:00 | NDC 00409909412

## 2020-05-24 MED ADMIN — FENTANYL CITRATE (PF) 50 MCG/ML IJ SOLN [3037]: 25 ug | INTRAVENOUS | @ 10:00:00 | NDC 00409909412

## 2020-05-24 MED ADMIN — BISACODYL 10 MG RE SUPP [1080]: 10 mg | RECTAL | @ 03:00:00 | NDC 00574705012

## 2020-05-24 MED ADMIN — METOPROLOL TARTRATE 5 MG/5 ML IV SOLN [5007]: 2.5 mg | INTRAVENOUS | @ 06:00:00 | NDC 72611074001

## 2020-05-24 MED ADMIN — CEFEPIME 2 GRAM IVP [213758]: 2 g | INTRAVENOUS | @ 03:00:00 | NDC 60505614700

## 2020-05-24 MED ADMIN — ADULT PN CONTINUOUS-ION BASED [212248]: 2280.000 mL | INTRAVENOUS | @ 03:00:00 | NDC 00338050206

## 2020-05-24 MED ADMIN — CEFEPIME 2 GRAM IVP [213758]: 2 g | INTRAVENOUS | @ 09:00:00 | NDC 60505614700

## 2020-05-24 MED ADMIN — ENOXAPARIN 100 MG/ML SC SYRG [85051]: 90 mg | SUBCUTANEOUS | @ 03:00:00 | NDC 00781326805

## 2020-05-24 MED ADMIN — METOPROLOL TARTRATE 5 MG/5 ML IV SOLN [5007]: 2.5 mg | INTRAVENOUS | @ 11:00:00 | NDC 72611074001

## 2020-05-24 MED ADMIN — HYDROMORPHONE (PF) 2 MG/ML IJ SYRG [163476]: 0.5 mg | INTRAVENOUS | @ 12:00:00 | Stop: 2020-05-24 | NDC 00409131203

## 2020-05-24 MED ADMIN — VANCOMYCIN 1.5G IN 0.9% NACL IVPB (BATCHED) [213737]: 1500 mg | INTRAVENOUS | @ 09:00:00 | NDC 54029433309

## 2020-05-24 MED ADMIN — HYDROXYZINE HCL 50 MG/ML IM SOLN [3770]: 50 mg | INTRAMUSCULAR | @ 10:00:00 | Stop: 2020-05-24 | NDC 00517560125

## 2020-05-24 MED ADMIN — METOPROLOL TARTRATE 5 MG/5 ML IV SOLN [5007]: 2.5 mg | INTRAVENOUS | @ 19:00:00 | NDC 36000003310

## 2020-05-24 MED ADMIN — IOHEXOL 300 MG IODINE/ML IV SOLN [79156]: 75 mL | INTRA_ARTERIAL | @ 17:00:00 | Stop: 2020-05-24 | NDC 00407141363

## 2020-05-24 MED ADMIN — CEFEPIME 2 GRAM IVP [213758]: 2 g | INTRAVENOUS | @ 19:00:00 | NDC 60505614700

## 2020-05-24 NOTE — Anesthesia Procedure Notes
Procedures

## 2020-05-24 NOTE — Anesthesia Post-Procedure Evaluation
Post-Anesthesia Evaluation    Name: Kenneth Robles      MRN: [id number]     DOB: [birth year]     Age: 87 y.o.     Sex: male   __________________________________________________________________________     Procedure Information     Anesthesia Start Date/Time: 05/24/20 1022    Scheduled providers: Llesenia Fogal, Mila Homer, MD; Allean Found, RN; Andrey Spearman, RT(R)(VI),LRT; Vonna Kotyk, MD    Procedure: IR AORTOILIOFEMORAL DIAGNOSTIC WITH INTERVENTION    Location: Interventional Radiology: Main [facility]          Post-Anesthesia Vitals  BP: 143/58 (04/28 1245)  Pulse: 63 (04/28 1245)  Respirations: 26 PER MINUTE (04/28 1245)  SpO2: 91 % (04/28 1245)  SpO2 Pulse: 63 (04/28 1245)  O2 Device: None (Room air) (04/28 1245)   Vitals Value Taken Time   BP 143/58 05/24/20 1245   Temp     Pulse 63 05/24/20 1245   Respirations 26 PER MINUTE 05/24/20 1245   SpO2 91 % 05/24/20 1245   O2 Device None (Room air) 05/24/20 1245   ABP     ART BP           Post Anesthesia Evaluation Note    Evaluation location: Pre/Post  Patient participation: recovered; patient unable to participate at baseline  Level of consciousness: obtunded/minimal responses    Pain score: 0  Pain management: adequate    Hydration: normovolemia  Temperature: 36.0C - 38.4C  Airway patency: adequate    Perioperative Events       Post-op nausea and vomiting: no PONV    Postoperative Status  Cardiovascular status: hemodynamically stable  Additional comments: Patient back to baseline state, VSS.  No apparent anesthetic complications.          Perioperative Events  No complications documented.

## 2020-05-24 NOTE — Anesthesia Pre-Procedure Evaluation
Anesthesia Pre-Procedure Evaluation    Name: Kenneth Robles      MRN: [id number]     DOB: 07/30/1930     Age: 87 y.o.     Sex: male   _________________________________________________________________________     Procedure Info:   Procedure Information     Anesthesia Start Date/Time: 05/24/20 1022    Scheduled providers: Trinette Vera, Mila Homer, MD; Allean Found, RN; Andrey Spearman, RT(R)(VI),LRT; Demetra Shiner, MD    Procedure: IR AORTOILIOFEMORAL DIAGNOSTIC WITH INTERVENTION    Location: Interventional Radiology: Main Campus, [facility]          Physical Assessment  Vital Signs (last filed in past 24 hours):  BP: 142/52 (04/28 0610)  Temp: 36.2 ?C (97.2 ?F) (04/28 0435)  Pulse: 61 (04/28 0610)  Respirations: 20 PER MINUTE (04/28 0435)  SpO2: 96 % (04/28 0435)  O2 Device: Nasal cannula (04/28 0950)  O2 Liter Flow: 3 Lpm (04/28 0950)      Patient History   Allergies   Allergen Reactions   ? Crestor [Rosuvastatin] MUSCLE PAIN   ? Lipitor [Atorvastatin] MUSCLE PAIN   ? Statins-Hmg-Coa Reductase Inhibitors MUSCLE PAIN   ? Zetia [Ezetimibe] MUSCLE PAIN   ? Zocor [Simvastatin] MUSCLE PAIN        Current Medications    Medication Directions   acetaminophen (TYLENOL) 500 mg tablet Take 500 mg by mouth twice daily. Max of 4,000 mg of acetaminophen in 24 hours.   allopurinol (ZYLOPRIM) 100 mg tablet Take 1 Tab by mouth Daily.   aspirin EC 81 mg tablet Take 81 mg by mouth daily. Take with food.   atorvastatin (LIPITOR) 20 mg tablet Take one tablet by mouth daily.   BIOTIN PO Take 1,000 mcg by mouth daily.   coenzyme Q10 100 mg cap Take 50 mg by mouth at bedtime daily.   gabapentin (NEURONTIN) 600 mg tablet Take 900 mg by mouth twice daily.   hydroxychloroquine (PLAQUENIL) 200 mg PO tablet Take 200 mg by mouth twice daily.   hyoscyamine SR (LEVBID) 0.375 mg tablet Take 0.375 mg by mouth twice daily.   ketoconazole (NIZORAL) 2 % topical cream Apply  topically to affected area twice daily.   ketoconazole (NIZORAL) 2 % topical shampoo Apply  topically to affected area three times weekly. Apply topically to affected area of damp skin, lather, leave on 5 minutes, and rinse. Repeat 2-3 times per week.   meclizine (ANTIVERT) 25 mg tablet Take 25 mg by mouth daily.   methylcellulose (CITRUCEL PO) Take  by mouth daily.   metoprolol XL (TOPROL XL) 25 mg extended release tablet Take one tablet by mouth daily.   nitroglycerin (NITROSTAT) 0.4 mg tablet DISSOLVE ONE TABLET UNDER THE TONGUE EVERY 5 MINUTES AS NEEDED FOR CHEST PAIN.  DO NOT EXCEED A TOTAL OF 3 DOSES IN 15 MINUTES   other medication Medication Name & Strength: Equate Allergy  Dose: 10 mg   Frequency: Daily   primidone (MYSOLINE) 50 mg tablet Take 4 tablets by mouth at bedtime daily.   psyllium seed (with dextrose) (FIBER PO) Take 1,250 mg by mouth daily.   triamcinolone acetonide (KENALOG) 0.1 % topical ointment Apply  topically to affected area twice daily.   warfarin (COUMADIN) 1 mg tablet Take one tablet to three tablets by mouth daily. As directed by cardiology   warfarin (COUMADIN) 5 mg tablet As directed         Review of Systems/Medical History        PONV Screening:  Non-smoker  No history of anesthetic complications  No family history of anesthetic complications      Airway - negative        Pulmonary       Not a current smoker      no COPD      No recent URI      Not on home oxygen      Sleep apnea (Pt states he may have OSA but never tested)      Cardiovascular         Exercise tolerance: <4 METS       Beta Blocker therapy: Yes      Beta blockers within 24 hours: No        CIED  : pacemaker              Medtronic              Device Indication(s):   SSS          not pacemaker dependent                         CIED interrogated last 12 months              Device settings:  DDDR              Battery life > 1 year              Magnet response: Asynchronous mode and 85 bpm                Hypertension,         Past MI, > 6 months      Coronary artery disease      PTCA (2011) Dysrhythmias (warfarin); atrial fibrillation      CHF      Hyperlipidemia      SSS s/p PPM    Cath 2018  FINAL IMPRESSION:    1. Mild to moderate nonobstructive coronary artery disease,which appears to be unchanged from prior cath, as described above.  2. Patent LAD stent with mild in-stent restenosis  3. Normal LVEDP.  4. No significant gradient across the aortic valve on pullback.        GI/Hepatic/Renal       Inflammatory bowel disease      GERD,       No hx of liver disease     No renal disease      SBO    Gastric ulcer      Neuro/Psych       No seizures      No CVA      Musculoskeletal         Back pain      Arthritis      Endocrine/Other         Malignancy (hx prostate cancer)   Physical Exam    Airway Findings      Mallampati: III      TM distance: >3 FB      Neck ROM: full      Mouth opening: good      Airway patency: adequate    Dental Findings:       Upper dentures and lower dentures    Cardiovascular Findings:       Rhythm: irregular      Rate: normal      Comments: Paced    Pulmonary Findings:       Breath sounds clear to auscultation.  Comments: Distant breath sounds    Abdominal Findings:       Not obese      Abdominal exam deferred    Neurological Findings:         Altered mental status      Comments: Patient not responsive to commands, moaning in pain with slight movement.      Constitutional findings:       Well-developed      Well-nourished     ECHO 10/03/2016  Interpretation Summary    1. Normal left ventricular ejection fraction.  Left ventricular cavity mildly dilated with mild concentric hypertrophy.   2. LVEF= 60%.  3. Mild aortic regurgitation. No other valvular abnormalities noted.    4. Right ventricular function normal however size moderately dilated.  5. Pulmonary artery systolic pressure cannot be calculated from this study.  5. Compaired to previous ECHO on 10/11/2012, no major change noted.    Diagnostic Tests  Hematology:   Lab Results   Component Value Date    HGB 9.4 05/24/2020 HCT 28.6 05/24/2020    PLTCT 253 05/24/2020    WBC 19.4 05/24/2020    NEUT 71 03/11/2018    ANC 6.40 03/11/2018    ALC 1.50 03/11/2018    MONA 10 03/11/2018    AMC 0.90 03/11/2018    EOSA 1 03/11/2018    ABC 0.10 03/11/2018    MCV 96.7 05/24/2020    MCH 31.7 05/24/2020    MCHC 32.8 05/24/2020    MPV 9.5 05/24/2020    RDW 15.9 05/24/2020         General Chemistry:   Lab Results   Component Value Date    NA 148 05/24/2020    K 4.1 05/24/2020    CL 112 05/24/2020    CO2 25 05/24/2020    GAP 11 05/24/2020    BUN 38 05/24/2020    CR 0.84 05/24/2020    GLU 98 05/24/2020    CA 8.1 05/24/2020    ALBUMIN 3.1 05/24/2020    LACTIC 1.4 05/22/2020    OBSCA 0.95 05/12/2020    MG 2.5 05/24/2020    TOTBILI 0.7 05/24/2020    PO4 4.0 05/24/2020      Coagulation:   Lab Results   Component Value Date    PT 11.7 05/24/2020    PTT 52.7 05/18/2020    INR 1.0 05/24/2020    INR 2.4 04/12/2020         Anesthesia Plan    ASA score: 4   Plan: general  Induction method: intravenous  NPO status: full stomach      Informed Consent  Anesthetic plan and risks discussed with patient.  Use of blood products discussed with patient  Blood Consent: consented      Plan discussed with: anesthesiologist and CRNA.  Comments: (Rapid sequence induction to protect airway, supportive care as needed.  Anesthestic discussed with family and they consent, all questions answered.  )

## 2020-05-25 MED ADMIN — FENTANYL CITRATE (PF) 50 MCG/ML IJ SOLN [3037]: 25 ug | INTRAVENOUS | @ 01:00:00 | NDC 00409909412

## 2020-05-25 MED ADMIN — BISACODYL 10 MG RE SUPP [1080]: 10 mg | RECTAL | @ 14:00:00 | NDC 00574705012

## 2020-05-25 MED ADMIN — CEFEPIME 2 GRAM IVP [213758]: 2 g | INTRAVENOUS | @ 10:00:00 | NDC 60505614700

## 2020-05-25 MED ADMIN — CEFEPIME 2 GRAM IVP [213758]: 2 g | INTRAVENOUS | @ 19:00:00 | NDC 60505614700

## 2020-05-25 MED ADMIN — VANCOMYCIN 1.5G IN 0.9% NACL IVPB (BATCHED) [213737]: 1500 mg | INTRAVENOUS | @ 10:00:00 | NDC 54029433309

## 2020-05-25 MED ADMIN — METOPROLOL TARTRATE 5 MG/5 ML IV SOLN [5007]: 2.5 mg | INTRAVENOUS | @ 20:00:00 | NDC 36000003310

## 2020-05-25 MED ADMIN — ADULT PN CONTINUOUS-ION BASED [212248]: 2280.000 mL | INTRAVENOUS | @ 01:00:00 | NDC 00338050206

## 2020-05-25 MED ADMIN — FENTANYL CITRATE (PF) 50 MCG/ML IJ SOLN [3037]: 25 ug | INTRAVENOUS | @ 07:00:00 | NDC 00409909412

## 2020-05-25 MED ADMIN — CEFEPIME 2 GRAM IVP [213758]: 2 g | INTRAVENOUS | @ 01:00:00 | NDC 60505614700

## 2020-05-25 MED ADMIN — FENTANYL CITRATE (PF) 50 MCG/ML IJ SOLN [3037]: 25 ug | INTRAVENOUS | @ 10:00:00 | NDC 00409909412

## 2020-05-25 MED ADMIN — METOPROLOL TARTRATE 5 MG/5 ML IV SOLN [5007]: 2.5 mg | INTRAVENOUS | @ 14:00:00 | NDC 36000003310

## 2020-05-25 MED ADMIN — FENTANYL CITRATE (PF) 50 MCG/ML IJ SOLN [3037]: 25 ug | INTRAVENOUS | @ 15:00:00 | NDC 00409909412

## 2020-05-25 MED ADMIN — METOPROLOL TARTRATE 5 MG/5 ML IV SOLN [5007]: 2.5 mg | INTRAVENOUS | @ 01:00:00 | NDC 36000003310

## 2020-05-25 MED ADMIN — FENTANYL CITRATE (PF) 50 MCG/ML IJ SOLN [3037]: 25 ug | INTRAVENOUS | @ 20:00:00 | NDC 00409909412

## 2020-05-25 MED ADMIN — METOPROLOL TARTRATE 5 MG/5 ML IV SOLN [5007]: 2.5 mg | INTRAVENOUS | @ 07:00:00 | NDC 36000003310

## 2020-05-25 MED ADMIN — BISACODYL 10 MG RE SUPP [1080]: 10 mg | RECTAL | @ 01:00:00 | NDC 00574705012

## 2020-05-26 MED ADMIN — BISACODYL 10 MG RE SUPP [1080]: 10 mg | RECTAL | @ 14:00:00 | NDC 00574705012

## 2020-05-26 MED ADMIN — METOPROLOL TARTRATE 5 MG/5 ML IV SOLN [5007]: 2.5 mg | INTRAVENOUS | @ 10:00:00 | NDC 36000003310

## 2020-05-26 MED ADMIN — CEFEPIME 2 GRAM IVP [213758]: 2 g | INTRAVENOUS | @ 03:00:00 | NDC 60505614700

## 2020-05-26 MED ADMIN — VANCOMYCIN 1.5G IN 0.9% NACL IVPB (BATCHED) [213737]: 1500 mg | INTRAVENOUS | @ 11:00:00 | Stop: 2020-05-26 | NDC 54029433309

## 2020-05-26 MED ADMIN — ADULT PN CONTINUOUS-ION BASED [212248]: 2280.000 mL | INTRAVENOUS | @ 03:00:00 | NDC 00338050206

## 2020-05-26 MED ADMIN — CEFEPIME 2 GRAM IVP [213758]: 2 g | INTRAVENOUS | @ 10:00:00 | NDC 60505614700

## 2020-05-26 MED ADMIN — BISACODYL 10 MG RE SUPP [1080]: 10 mg | RECTAL | @ 03:00:00 | NDC 00574705012

## 2020-05-26 MED ADMIN — CEFEPIME 2 GRAM IVP [213758]: 2 g | INTRAVENOUS | @ 19:00:00 | NDC 60505614700

## 2020-05-26 MED ADMIN — FENTANYL CITRATE (PF) 50 MCG/ML IJ SOLN [3037]: 25 ug | INTRAVENOUS | @ 10:00:00 | NDC 00409909412

## 2020-05-26 MED ADMIN — WATER FOR INJECTION, STERILE IJ SOLN [79513]: 20 mL | INTRAVENOUS | @ 19:00:00 | Stop: 2020-05-26 | NDC 00409488723

## 2020-05-26 MED ADMIN — METOPROLOL TARTRATE 5 MG/5 ML IV SOLN [5007]: 2.5 mg | INTRAVENOUS | @ 14:00:00 | NDC 72611074001

## 2020-05-26 MED ADMIN — FENTANYL CITRATE (PF) 50 MCG/ML IJ SOLN [3037]: 25 ug | INTRAVENOUS | @ 03:00:00 | NDC 00409909412

## 2020-05-26 MED ADMIN — METOPROLOL TARTRATE 5 MG/5 ML IV SOLN [5007]: 2.5 mg | INTRAVENOUS | @ 22:00:00 | NDC 72611074001

## 2020-05-26 MED ADMIN — METOPROLOL TARTRATE 5 MG/5 ML IV SOLN [5007]: 2.5 mg | INTRAVENOUS | @ 03:00:00 | NDC 36000003310

## 2020-05-27 ENCOUNTER — Inpatient Hospital Stay: Admit: 2020-05-27 | Discharge: 2020-05-27 | Payer: MEDICARE

## 2020-05-27 MED ADMIN — HYDROMORPHONE (PF) 2 MG/ML IJ SYRG [163476]: 1 mg | INTRAVENOUS | @ 17:00:00 | NDC 00409131203

## 2020-05-27 MED ADMIN — CEFEPIME 2 GRAM IVP [213758]: 2 g | INTRAVENOUS | @ 10:00:00 | NDC 60505614700

## 2020-05-27 MED ADMIN — BISACODYL 10 MG RE SUPP [1080]: 10 mg | RECTAL | @ 01:00:00 | NDC 00574705012

## 2020-05-27 MED ADMIN — METOPROLOL TARTRATE 5 MG/5 ML IV SOLN [5007]: 2.5 mg | INTRAVENOUS | @ 20:00:00 | NDC 72611074001

## 2020-05-27 MED ADMIN — METOPROLOL TARTRATE 5 MG/5 ML IV SOLN [5007]: 2.5 mg | INTRAVENOUS | @ 05:00:00 | NDC 36000003310

## 2020-05-27 MED ADMIN — CEFEPIME 2 GRAM IVP [213758]: 2 g | INTRAVENOUS | @ 18:00:00 | NDC 60505614700

## 2020-05-27 MED ADMIN — BISACODYL 10 MG RE SUPP [1080]: 10 mg | RECTAL | @ 14:00:00 | NDC 00574705012

## 2020-05-27 MED ADMIN — ENOXAPARIN 100 MG/ML SC SYRG [85051]: 90 mg | SUBCUTANEOUS | @ 14:00:00 | NDC 00781326805

## 2020-05-27 MED ADMIN — ADULT PN CONTINUOUS-ION BASED [212248]: 2280.000 mL | INTRAVENOUS | @ 01:00:00 | NDC 00338050206

## 2020-05-27 MED ADMIN — CEFEPIME 2 GRAM IVP [213758]: 2 g | INTRAVENOUS | @ 01:00:00 | NDC 60505614700

## 2020-05-27 MED ADMIN — METOPROLOL TARTRATE 5 MG/5 ML IV SOLN [5007]: 2.5 mg | INTRAVENOUS | @ 14:00:00 | NDC 72611074001

## 2020-05-27 MED ADMIN — DIATRIZOATE MEG-DIATRIZOAT SOD 66-10 % PO SOLN [77945]: 120 mL | NASOGASTRIC | @ 17:00:00 | Stop: 2020-05-27 | NDC 00270044540

## 2020-05-27 MED ADMIN — METOPROLOL TARTRATE 5 MG/5 ML IV SOLN [5007]: 2.5 mg | INTRAVENOUS | @ 10:00:00 | NDC 72611074001

## 2020-05-27 MED ADMIN — MELATONIN 3 MG PO TAB [16830]: 3 mg | NASOGASTRIC | @ 05:00:00 | NDC 50268052411

## 2020-05-28 MED ADMIN — ADULT PN CONTINUOUS-ION BASED [212248]: 2280.000 mL | INTRAVENOUS | @ 02:00:00 | NDC 00338050206

## 2020-05-28 MED ADMIN — BISACODYL 10 MG RE SUPP [1080]: 10 mg | RECTAL | @ 15:00:00 | NDC 00574705012

## 2020-05-28 MED ADMIN — ENOXAPARIN 100 MG/ML SC SYRG [85051]: 90 mg | SUBCUTANEOUS | @ 02:00:00 | NDC 00781326805

## 2020-05-28 MED ADMIN — METOPROLOL TARTRATE 5 MG/5 ML IV SOLN [5007]: 2.5 mg | INTRAVENOUS | @ 10:00:00 | NDC 72611074001

## 2020-05-28 MED ADMIN — OXYCODONE 5 MG/5 ML PO SOLN [10813]: 5 mg | NASOGASTRIC | @ 06:00:00 | NDC 00904682805

## 2020-05-28 MED ADMIN — ACETAMINOPHEN 160 MG/5 ML PO SOLN [100]: 650 mg | NASOGASTRIC | @ 06:00:00 | NDC 66689005601

## 2020-05-28 MED ADMIN — ENOXAPARIN 100 MG/ML SC SYRG [85051]: 90 mg | SUBCUTANEOUS | @ 15:00:00 | NDC 00781326805

## 2020-05-28 MED ADMIN — METOPROLOL TARTRATE 5 MG/5 ML IV SOLN [5007]: 2.5 mg | INTRAVENOUS | @ 22:00:00 | NDC 72611074001

## 2020-05-28 MED ADMIN — CEFEPIME 2 GRAM IVP [213758]: 2 g | INTRAVENOUS | @ 02:00:00 | NDC 60505614700

## 2020-05-28 MED ADMIN — CEFEPIME 2 GRAM IVP [213758]: 2 g | INTRAVENOUS | @ 10:00:00 | NDC 60505614700

## 2020-05-28 MED ADMIN — CEFEPIME 2 GRAM IVP [213758]: 2 g | INTRAVENOUS | @ 19:00:00 | NDC 60505614700

## 2020-05-28 MED ADMIN — MELATONIN 3 MG PO TAB [16830]: 3 mg | NASOGASTRIC | @ 06:00:00 | NDC 50268052411

## 2020-05-28 MED ADMIN — ACETAMINOPHEN 160 MG/5 ML PO SOLN [100]: 650 mg | NASOGASTRIC | @ 22:00:00 | NDC 66689005601

## 2020-05-28 MED ADMIN — METOPROLOL TARTRATE 5 MG/5 ML IV SOLN [5007]: 2.5 mg | INTRAVENOUS | @ 17:00:00 | NDC 72611074001

## 2020-05-28 MED ADMIN — ACETAMINOPHEN 160 MG/5 ML PO SOLN [100]: 650 mg | NASOGASTRIC | @ 14:00:00 | NDC 66689005601

## 2020-05-29 ENCOUNTER — Encounter: Admit: 2020-05-29 | Discharge: 2020-05-29 | Payer: MEDICARE

## 2020-05-29 MED ADMIN — CEFEPIME 2 GRAM IVP [213758]: 2 g | INTRAVENOUS | @ 10:00:00 | Stop: 2020-05-29 | NDC 60505614700

## 2020-05-29 MED ADMIN — ENOXAPARIN 100 MG/ML SC SYRG [85051]: 90 mg | SUBCUTANEOUS | @ 02:00:00 | NDC 00781326805

## 2020-05-29 MED ADMIN — METOPROLOL TARTRATE 5 MG/5 ML IV SOLN [5007]: 2.5 mg | INTRAVENOUS | @ 23:00:00 | NDC 72611074001

## 2020-05-29 MED ADMIN — ACETAMINOPHEN 160 MG/5 ML PO SOLN [100]: 650 mg | NASOGASTRIC | @ 23:00:00 | NDC 66689005601

## 2020-05-29 MED ADMIN — BISACODYL 10 MG RE SUPP [1080]: 10 mg | RECTAL | @ 14:00:00 | NDC 00574705012

## 2020-05-29 MED ADMIN — METOPROLOL TARTRATE 5 MG/5 ML IV SOLN [5007]: 2.5 mg | INTRAVENOUS | @ 10:00:00 | NDC 72611074001

## 2020-05-29 MED ADMIN — METOPROLOL TARTRATE 5 MG/5 ML IV SOLN [5007]: 2.5 mg | INTRAVENOUS | @ 19:00:00 | NDC 72611074001

## 2020-05-29 MED ADMIN — MELATONIN 3 MG PO TAB [16830]: 3 mg | NASOGASTRIC | @ 02:00:00 | NDC 50268052411

## 2020-05-29 MED ADMIN — CEFEPIME 2 GRAM IVP [213758]: 2 g | INTRAVENOUS | @ 02:00:00 | NDC 60505614700

## 2020-05-29 MED ADMIN — ACETAMINOPHEN 160 MG/5 ML PO SOLN [100]: 650 mg | NASOGASTRIC | @ 19:00:00 | NDC 66689005601

## 2020-05-29 MED ADMIN — ADULT PN CYCLIC-ION BASED [212485]: 2210.000 mL | INTRAVENOUS | @ 02:00:00 | NDC 00338050206

## 2020-05-29 MED ADMIN — ENOXAPARIN 100 MG/ML SC SYRG [85051]: 90 mg | SUBCUTANEOUS | @ 14:00:00 | NDC 00781326805

## 2020-05-29 MED ADMIN — BISACODYL 10 MG RE SUPP [1080]: 10 mg | RECTAL | @ 02:00:00 | NDC 00574705012

## 2020-05-29 MED ADMIN — METOPROLOL TARTRATE 5 MG/5 ML IV SOLN [5007]: 2.5 mg | INTRAVENOUS | @ 05:00:00 | NDC 72611074001

## 2020-05-29 MED ADMIN — ACETAMINOPHEN 160 MG/5 ML PO SOLN [100]: 128 mg | NASOGASTRIC | @ 05:00:00 | NDC 66689005601

## 2020-05-30 MED ADMIN — METOPROLOL TARTRATE 5 MG/5 ML IV SOLN [5007]: 2.5 mg | INTRAVENOUS | @ 17:00:00 | Stop: 2020-05-30 | NDC 72611074001

## 2020-05-30 MED ADMIN — ENOXAPARIN 100 MG/ML SC SYRG [85051]: 90 mg | SUBCUTANEOUS | @ 14:00:00 | Stop: 2020-05-30 | NDC 00781326805

## 2020-05-30 MED ADMIN — MELATONIN 3 MG PO TAB [16830]: 3 mg | NASOGASTRIC | @ 02:00:00 | NDC 50268052411

## 2020-05-30 MED ADMIN — METOPROLOL TARTRATE 5 MG/5 ML IV SOLN [5007]: 2.5 mg | INTRAVENOUS | @ 10:00:00 | Stop: 2020-05-30 | NDC 72611074001

## 2020-05-30 MED ADMIN — BISACODYL 10 MG RE SUPP [1080]: 10 mg | RECTAL | @ 02:00:00 | NDC 00574705012

## 2020-05-30 MED ADMIN — METOPROLOL TARTRATE 5 MG/5 ML IV SOLN [5007]: 2.5 mg | INTRAVENOUS | @ 04:00:00 | NDC 72611074001

## 2020-05-30 MED ADMIN — ACETAMINOPHEN 160 MG/5 ML PO SOLN [100]: 650 mg | NASOGASTRIC | @ 17:00:00 | Stop: 2020-05-30 | NDC 66689005601

## 2020-05-30 MED ADMIN — ENOXAPARIN 100 MG/ML SC SYRG [85051]: 90 mg | SUBCUTANEOUS | @ 02:00:00 | NDC 00781326805

## 2020-05-30 MED ADMIN — ADULT PN CYCLIC-ION BASED [212485]: 2210.000 mL | INTRAVENOUS | @ 02:00:00 | NDC 00338050206

## 2020-06-12 ENCOUNTER — Encounter: Admit: 2020-06-12 | Discharge: 2020-06-12 | Payer: MEDICARE

## 2020-06-12 ENCOUNTER — Ambulatory Visit: Admit: 2020-06-12 | Discharge: 2020-06-13 | Payer: MEDICARE

## 2020-06-12 DIAGNOSIS — R972 Elevated prostate specific antigen [PSA]: Secondary | ICD-10-CM

## 2020-06-12 DIAGNOSIS — L570 Actinic keratosis: Secondary | ICD-10-CM

## 2020-06-12 DIAGNOSIS — IMO0002 Degenerative disc disease: Secondary | ICD-10-CM

## 2020-06-12 DIAGNOSIS — I2510 Atherosclerotic heart disease of native coronary artery without angina pectoris: Secondary | ICD-10-CM

## 2020-06-12 DIAGNOSIS — D692 Other nonthrombocytopenic purpura: Secondary | ICD-10-CM

## 2020-06-12 DIAGNOSIS — I10 Essential (primary) hypertension: Secondary | ICD-10-CM

## 2020-06-12 DIAGNOSIS — B079 Viral wart, unspecified: Secondary | ICD-10-CM

## 2020-06-12 DIAGNOSIS — I200 Unstable angina: Secondary | ICD-10-CM

## 2020-06-12 DIAGNOSIS — K529 Noninfective gastroenteritis and colitis, unspecified: Secondary | ICD-10-CM

## 2020-06-12 DIAGNOSIS — I8390 Asymptomatic varicose veins of unspecified lower extremity: Secondary | ICD-10-CM

## 2020-06-12 DIAGNOSIS — K289 Gastrojejunal ulcer, unspecified as acute or chronic, without hemorrhage or perforation: Secondary | ICD-10-CM

## 2020-06-12 DIAGNOSIS — I219 Acute myocardial infarction, unspecified: Secondary | ICD-10-CM

## 2020-06-12 DIAGNOSIS — M069 Rheumatoid arthritis, unspecified: Secondary | ICD-10-CM

## 2020-06-12 DIAGNOSIS — L853 Xerosis cutis: Secondary | ICD-10-CM

## 2020-06-12 DIAGNOSIS — L814 Other melanin hyperpigmentation: Secondary | ICD-10-CM

## 2020-06-12 DIAGNOSIS — E785 Hyperlipidemia, unspecified: Secondary | ICD-10-CM

## 2020-06-12 DIAGNOSIS — L989 Disorder of the skin and subcutaneous tissue, unspecified: Secondary | ICD-10-CM

## 2020-06-12 NOTE — Telephone Encounter
Kenneth Robles and his daughter called back to discuss overdue INR.  He is currently at a rehab facility following his abdominal surgery at [facility].  He is planning on being there for a couple more weeks.  They will call us when he is discharged and back home so that we can resume management of INR.  His daughter states that they are currently bridging him with lovenox at the care center as they restart his coumadin.

## 2020-06-12 NOTE — Telephone Encounter
INR Overdue. Called and left message requesting pt to have INR drawn at earliest convenience.  Left callback number for any questions or concerns.

## 2020-06-13 DIAGNOSIS — S301XXA Contusion of abdominal wall, initial encounter: Secondary | ICD-10-CM

## 2020-06-13 DIAGNOSIS — K56609 Unspecified intestinal obstruction, unspecified as to partial versus complete obstruction: Secondary | ICD-10-CM

## 2020-06-26 ENCOUNTER — Encounter: Admit: 2020-06-26 | Discharge: 2020-06-26 | Payer: MEDICARE

## 2020-06-26 NOTE — Telephone Encounter
Patient was scheduled for a [employer]tronic Carelink on 06/08/20 that has not been received. Patient was instructed to send a manual transmission. Instructed if the transmitter does not appear to be working properly, they need to contact the device company directly. Patient was provided with that contact number. Requested the patient send [place] a MyChart message or contact our device nurses at [phone] to let us know after they have sent their transmission. LVM for patient requesting they send in a remote transmission.   BC

## 2020-07-04 ENCOUNTER — Encounter: Admit: 2020-07-04 | Discharge: 2020-07-04 | Payer: MEDICARE

## 2020-07-06 ENCOUNTER — Encounter: Admit: 2020-07-06 | Discharge: 2020-07-06 | Payer: MEDICARE

## 2020-07-06 MED ORDER — NITROGLYCERIN 0.4 MG SL SUBL
ORAL_TABLET | 0 refills | PRN
Start: 2020-07-06 — End: ?

## 2020-07-09 ENCOUNTER — Encounter: Admit: 2020-07-09 | Discharge: 2020-07-09 | Payer: MEDICARE

## 2020-07-09 NOTE — Telephone Encounter
Discussed recommendations along with risks and benefits from Dr. Jacklyn Shell with patient and family. They are going to check the price of lovenox before making a decision about how to transition from eliquis to warfarin.

## 2020-07-09 NOTE — Telephone Encounter
Patient would like to switch from eliquis back to warfarin. Patient has spent the last 8 weeks in the hospital and rehab following an abdominal surgery. Patient is now home. While at the rehab facility the patient was started on eliquis from lovenox.The eliquis is too costly and the patient would like to go back to warfarin as before his hospitalization.    Will route to Dr. Jacklyn Shell for his recommendations.

## 2020-07-12 ENCOUNTER — Encounter: Admit: 2020-07-12 | Discharge: 2020-07-12 | Payer: MEDICARE

## 2020-07-12 ENCOUNTER — Ambulatory Visit: Admit: 2020-07-12 | Discharge: 2020-07-12 | Payer: MEDICARE

## 2020-07-12 DIAGNOSIS — I219 Acute myocardial infarction, unspecified: Secondary | ICD-10-CM

## 2020-07-12 DIAGNOSIS — K289 Gastrojejunal ulcer, unspecified as acute or chronic, without hemorrhage or perforation: Secondary | ICD-10-CM

## 2020-07-12 DIAGNOSIS — I8390 Asymptomatic varicose veins of unspecified lower extremity: Secondary | ICD-10-CM

## 2020-07-12 DIAGNOSIS — L989 Disorder of the skin and subcutaneous tissue, unspecified: Secondary | ICD-10-CM

## 2020-07-12 DIAGNOSIS — E785 Hyperlipidemia, unspecified: Secondary | ICD-10-CM

## 2020-07-12 DIAGNOSIS — R972 Elevated prostate specific antigen [PSA]: Secondary | ICD-10-CM

## 2020-07-12 DIAGNOSIS — D692 Other nonthrombocytopenic purpura: Secondary | ICD-10-CM

## 2020-07-12 DIAGNOSIS — L814 Other melanin hyperpigmentation: Secondary | ICD-10-CM

## 2020-07-12 DIAGNOSIS — I200 Unstable angina: Secondary | ICD-10-CM

## 2020-07-12 DIAGNOSIS — I2510 Atherosclerotic heart disease of native coronary artery without angina pectoris: Secondary | ICD-10-CM

## 2020-07-12 DIAGNOSIS — K529 Noninfective gastroenteritis and colitis, unspecified: Secondary | ICD-10-CM

## 2020-07-12 DIAGNOSIS — L570 Actinic keratosis: Secondary | ICD-10-CM

## 2020-07-12 DIAGNOSIS — I10 Essential (primary) hypertension: Secondary | ICD-10-CM

## 2020-07-12 DIAGNOSIS — L853 Xerosis cutis: Secondary | ICD-10-CM

## 2020-07-12 DIAGNOSIS — M069 Rheumatoid arthritis, unspecified: Secondary | ICD-10-CM

## 2020-07-12 DIAGNOSIS — IMO0002 Degenerative disc disease: Secondary | ICD-10-CM

## 2020-07-12 DIAGNOSIS — B079 Viral wart, unspecified: Secondary | ICD-10-CM

## 2020-07-12 NOTE — Patient Instructions
Please contact our clinic if you have any questions or concerns. Call us at [phone] or send an email through the MyChart system if you have non-urgent concerns. We will get back to you as soon as possible. For urgent or after hours needs, please call [phone] and ask for the [facility] on call provider.     NOTE: MyChart messages and phone calls received on weekends, on holidays, and after 4 pm on weekdays will NOT be seen until the following business day. If you have an urgent matter during these times, please call [phone] to reach the on-call team.     If you need prescription refills, please contact your pharmacy.     Our fax number is [phone]. Please be sure your name and date of birth are on any forms that are sent to us for completion. We will have them filled out and signed as soon as possible, but our providers are not in clinic every day, so it can take up to a week.     Lab and test results:  As a part of the CARES act, starting 04/28/2019, some results will be released to you via MyChart immediately and automatically.  You may see results before your provider sees them; however, your provider will review all these results and then they, or one of their team, will notify you of result information and recommendations.   Critical results will be addressed immediately, but otherwise, please allow us time to get back with you prior to you reaching out to us for questions.  This will usually take about 72 hours for labs and 5-7 days for procedure test results.

## 2020-07-18 ENCOUNTER — Encounter: Admit: 2020-07-18 | Discharge: 2020-07-18 | Payer: MEDICARE

## 2020-07-18 DIAGNOSIS — I480 Paroxysmal atrial fibrillation: Secondary | ICD-10-CM

## 2020-07-25 ENCOUNTER — Encounter: Admit: 2020-07-25 | Discharge: 2020-07-25 | Payer: MEDICARE

## 2020-07-26 NOTE — Telephone Encounter
Spoke with patient and notified we indeed received transmission this morning from his device and the report has been interogated and sent to provider.  Pt has not other questions at this time.

## 2020-07-31 ENCOUNTER — Encounter: Admit: 2020-07-31 | Discharge: 2020-07-31 | Payer: MEDICARE

## 2020-07-31 DIAGNOSIS — I8390 Asymptomatic varicose veins of unspecified lower extremity: Secondary | ICD-10-CM

## 2020-07-31 DIAGNOSIS — IMO0002 Degenerative disc disease: Secondary | ICD-10-CM

## 2020-07-31 DIAGNOSIS — K289 Gastrojejunal ulcer, unspecified as acute or chronic, without hemorrhage or perforation: Secondary | ICD-10-CM

## 2020-07-31 DIAGNOSIS — M069 Rheumatoid arthritis, unspecified: Secondary | ICD-10-CM

## 2020-07-31 DIAGNOSIS — I10 Essential (primary) hypertension: Secondary | ICD-10-CM

## 2020-07-31 DIAGNOSIS — L570 Actinic keratosis: Secondary | ICD-10-CM

## 2020-07-31 DIAGNOSIS — I200 Unstable angina: Secondary | ICD-10-CM

## 2020-07-31 DIAGNOSIS — K529 Noninfective gastroenteritis and colitis, unspecified: Secondary | ICD-10-CM

## 2020-07-31 DIAGNOSIS — L989 Disorder of the skin and subcutaneous tissue, unspecified: Secondary | ICD-10-CM

## 2020-07-31 DIAGNOSIS — I2510 Atherosclerotic heart disease of native coronary artery without angina pectoris: Secondary | ICD-10-CM

## 2020-07-31 DIAGNOSIS — I480 Paroxysmal atrial fibrillation: Secondary | ICD-10-CM

## 2020-07-31 DIAGNOSIS — B079 Viral wart, unspecified: Secondary | ICD-10-CM

## 2020-07-31 DIAGNOSIS — R972 Elevated prostate specific antigen [PSA]: Secondary | ICD-10-CM

## 2020-07-31 DIAGNOSIS — L814 Other melanin hyperpigmentation: Secondary | ICD-10-CM

## 2020-07-31 DIAGNOSIS — I219 Acute myocardial infarction, unspecified: Secondary | ICD-10-CM

## 2020-07-31 DIAGNOSIS — E785 Hyperlipidemia, unspecified: Secondary | ICD-10-CM

## 2020-07-31 DIAGNOSIS — L853 Xerosis cutis: Secondary | ICD-10-CM

## 2020-07-31 DIAGNOSIS — D692 Other nonthrombocytopenic purpura: Secondary | ICD-10-CM

## 2020-07-31 NOTE — Progress Notes
Date of Service: 07/31/2020    RHONAN SCOBEY is a 87 y.o. male.       HPI    Mr.?Delossantos is followed for coronary artery disease and paroxysmal atrial fibrillation.? On 05/12/2020 he developed small bowel obstruction.  He underwent exploratory laparotomy that day at [facility] hospital was found to have for ischemic enteritis.  He underwent repeat exploratory laparotomy on 05/14/2020.  He has had a slow but steady recovery.  He is currently participating in rehab at the hospital twice a week and another days goes to the local [employer] for an hour of limited cardio exercise.  He walks mostly without a cane but uses his cane for longer distances. ?He has received his Covid vaccines with booster and wears a mask when he goes shopping.  The patient reports that he is working with his urologist for continued treatment of prostate cancer.  He indicates that he has received radiation therapy previously.  He developed some aching in his thighs with walking which has been only very slowly progressive.  He never has ulcers on his toes. ?He reports no recent falls.[name] ?Otherwise, over the past?6?months?Mr. Pawley has felt well?and?reports no angina,?congestive symptoms, palpitations, sensation of sustained forceful heart pounding, or syncope.??I do not believe that he is aware when he has paroxysmal atrial fibrillation. ?His exercise tolerance has been stable.??He states that he can walk?100?feet at a?time,?which consists of a?trip to the mailbox and back.?The patient reports no myalgias, bleeding abnormalities,?or strokelike symptoms.??He?continues to have chronic back,hip and knee pain for which he sees a rheumatologist and orthopedic surgeon.  He also received some type of local therapy on his feet for peripheral neuropathy which helps. 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 ?His CHA2DS2-Vasc?score is 3?so?that he does not require bridging anticoagulation when he stops his warfarin for procedures or surgery. Mr. Debruyn saw Dr. Wallene Huh on 08/13/2015 because R-wave sensing on the right ventricular lead of his pacemaker had decreased. ?No action was required. ?In August 2017,?his atorvastatin dose was decreased to 20 mg daily because of myalgias, and he is?tolerating this dose?without adverse effects. Mr. Kibbey reported orthostasis in late August 2018 and his Imdur was stopped. ?This has improved his orthostasis considerably.His Flomax was stopped for similar reasons.  ??  Mr. Abraha past medical history is noteworthy for permanent pacemaker placement on 01/22/06. On 09/14/06 he underwent atrial fibrillation ablation complicated by perciardial effusion requiring pericardiocentesis. He underwent successful PCI of the mid and proximal LAD with 2 overlapping Xience drug-eluting stents, a 2.5 x 28 and a 3.0 x 15 Xience drug-eluting stent from distal to proximal on 05/23/09. Coronary angiography performed on 01/07/13 revealed ?  1. Moderate ostial LAD stenosis 40 percent to 50 percent, with a haziness in the mid LAD segment. The proximal LAD was widely patent and the FFR of the LAD was 0.84 at maximal hyperemia.  2. Chronic total occlusion of a high obtuse marginal branch with left-to-left collaterals, which was unchanged from the prior study dated 2011.   3. Normal left ventricular end-diastolic pressures.  Mr. Obarr was seen on 06/08/14 after his pacemaker had reached elective replacement interval and had defaulted to VVI pacing. At that time he had developed dyspnea with exertion. He underwent pacemaker generator replacement on 06/12/14. He tripped over a tool in his garage on 03/10/15 resulting in multiple ecchymosis to his face. He indicates that he was  evaluated and that there were no serious injuries.?The patient was hospitalized locally on September 03, 2016 for right lower lobe infiltrate/pneumonia was treated initially with Zosyn and then with Augmentin and albuterol inhaler. ?Apparently he had paroxysmal atrial fibrillation at the same time which resolved spontaneously.?The patient was hospitalized on December 09, 2016 for chest discomfort. ?There was no evidence for an acute coronary syndrome. ?Coronary angiography was performed which?showed mild to moderate nonobstructive coronary disease without high-grade stenosis. ?No coronary?intervention was required.??On November 02, 2017 while at the pool, he he stood up too quickly and became momentarily lightheaded and fell striking his head. A?CT scan of the spine and head work was obtained but showed no major bleeding.         Vitals:    07/31/20 0948   BP: 112/58   BP Source: Arm, Left Upper   Pulse: 73   SpO2: 98%   Weight: 88 kg (194 lb)   Height: 185.4 cm (6' 1)     Body mass index is 25.6 kg/m?[name]     Past Medical History  Patient Active Problem List    Diagnosis Date Noted   ? Postoperative follow-up 07/13/2020   ? Acute blood loss anemia 05/15/2020   ? Overweight (BMI 25.0-29.9) 05/15/2020   ? Acute postoperative pain of abdomen 05/15/2020   ? Small bowel obstruction (HCC) 05/14/2020   ? Rheumatoid arthritis(714.0) 03/09/2014   ? Left-sided low back pain without sciatica 03/02/2014   ? Lumbar radiculopathy 03/02/2014   ? Spondylosis of lumbar region without myelopathy or radiculopathy 03/02/2014   ? 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 Pigmented skin lesions - bilateral ear 11/25/2009     Solar lentigo with actinic purpura       ? CAD (coronary artery disease) 07/05/2009     Admitted with unstable angina. Cardiac cath on 05/23/2009 with successful PCI with overlapping Xience drug-eluting stents, a 2.5 x 28 and a 2.5 x 15 from distal to proximal, postdilated with a 2.75 and subsequently a 3.0 balloon.    08/29/2015 - Stress Test:  This study is mildly abnormal, indicative of a small mild intensity area of reversible ischemia involving the distal inferior wall.  The appearance is very similar to what was seen in 2012. The left ventricular systolic function is normal. There are no high risk prognostic indicators present.       ? Osteoarthritis 05/21/2009   ? PAF (paroxysmal atrial fibrillation) (HCC) 09/14/2006     Paroxysmal symptomatic atrial fibrillation controlled with Tikosyn. Previous left atrial ablation attempt in August 2008 with Dr. Kathleene Hazel. Unfortunately, it was complicated by cardiac tamponade, but the patient did well subsequently. He was placed on Tikosyn which seems to control his arrhythmia very well.     04/2010. Mr Loppnow declined restarting Tikosyn because atrial fibrillation wasn't frequent and it was very expensive. Target INR is 2.5.     ? Hyperlipidemia 09/14/2006     Mr. Strohm has had problems with statin myalgias and side effects from niacin. He declines any future lipid lower therapies.     ? Cardiac pacemaker in situ 09/14/2006   ? Sinus bradycardia 01/22/2006   ? HTN (hypertension) 01/22/2006   ? CRI (chronic renal insufficiency) 01/22/2006         Review of Systems   Constitutional: Negative.   HENT: Negative.    Eyes: Negative.    Cardiovascular: Negative.    Respiratory: Negative.  Endocrine: Negative.    Hematologic/Lymphatic: Negative.    Skin: Negative.    Musculoskeletal: Negative.    Gastrointestinal: Negative.    Genitourinary: Negative.    Neurological: Negative.    Psychiatric/Behavioral: Negative.    Allergic/Immunologic: Negative.        Physical Exam  GENERAL: The patient is well developed, well nourished, resting comfortably and in no distress. ?  HEENT: No abnormalities of the visible oro-nasopharynx, conjunctiva or sclera are noted.??Wearing hearing aids.  NECK: There is no jugular venous distension. Carotids are palpable and without bruits. There is no thyroid enlargement.  Chest: Lung fields are clear to auscultation. There are no wheezes or crackles.?His pacemaker incision in left infraclavicular area looks good?without erosion or cellulitis.  CV: There is a regular rhythm. The first and second heart sounds are normal. There are no murmurs, gallops or rubs. His apical heart rate is?80?BPM.  ABD: The abdomen is soft and supple with normal bowel sounds. There is no hepatosplenomegaly, ascites, tenderness, masses or bruits.??His abdominal incision is healed nicely.  Neuro: There are no focal motor defects. Ambulation is normal. Cognitive function appears normal.  Ext:?There is no edema or evidence of deep vein thrombosis. Good radial pulses. Good posterior tibial pulses. Decreased right dorsalis pulse but good capillary refill and no ischemic ulcers.  SKIN:?No cellulitis or skin rashes noted.  PSYCH:?The patient is calm, rationale and oriented.    Cardiovascular Studies  A twelve-lead ECG obtained on 07/31/2020 reveals an atrial paced rhythm with a heart rate of 81 bpm.  Right bundle branch block and left anterior fascicular block are noted.    Remote pacemaker interrogation 07/25/2020:  Interpretation Summary    Title: Normal Remote: No Events    * Normal Device Function  * Alerts or events: None since 03/08/20  * Battery: 3.5 years   * Sensing, impedance and thresholds reviewed  * Programmed parameters reviewed  * Presenting rhythm: AP-VS 62 bpm  * Heart Rate Histograms are fairly flat with some distribution noted   * No significant changes noted   Additional Notes: VP: 0.2%     Coronary angiography 12/09/2016:  1. Left main coronary artery: ?The left main coronary artery arises normally from left coronary sinus. ?Distally, it bifurcates into the left anterior descending artery and left circumflex artery. ?It is angiographically free of significant disease.  2. Left anterior descending artery:??The left anterior descending artery appears to be a large-caliber vessel which has previously placed stents extending from the proximal to mid LAD in overlapping fashion. ?The stents appear to be patent, with 30% in-stent restenosis in the midportion of the stent. ?The ostial LAD has 40% to 50% disease. ?The midportion of the LAD has 20% disease just distal to the previously placed stents. ?The diagonal vessels appear to be small caliber, without significant disease.  3. Left circumflex artery:??The left circumflex artery appears to be a large-caliber vessel which has 20% disease in the proximal region. ?The 1st obtuse marginal branch is known to be chronically totally occluded. ?It appears to be filling from left-to-left collaterals. ?The 2nd and 3rd obtuse marginal branches appear to be free of significant disease.   4. Right coronary artery:??The right coronary artery has an anterior and somewhat low takeoff and arises from the right coronary sinus. ?It distally bifurcates into PDA and PLV branches. ?It is a large dominant vessel. ?The RCA has 20% disease extending from the proximal midportion to the mid distal portion. ?The PDA and PLV branches appear to be free  of significant disease.  ??  FINAL IMPRESSION:??  1. Mild to moderate nonobstructive coronary artery disease,which appears to be unchanged from prior cath, as described above.  2. Patent LAD stent with mild in-stent restenosis  3. Normal LVEDP.  4. No significant gradient across the aortic valve on pullback.  ?  RECOMMENDATIONS:??The present study appears to be grossly unchanged compared to a prior study in 2014. ?Therefore, we will recommend medical management at this time.    Echo Doppler 05/12/2020:  Interpretation Summary    1. Normal LV cavity size and systolic function, EF ~ 65%  2. No evidence of LV thrombus with use of echo contrast  3. Right ventricle was not well visualized on the study for quantitative assessment.  Subjectively it is normal in size and systolic function  4. Atria were not well visualized on the study  5. Device lead visualized within the right ventricle  6. Mild central aortic regurgitation  7. The estimated pulmonary artery systolic pressure is 31 mmHg + right atrial pressure (IVC not well visualized on this study)  8. Subcostal images were non-diagnostic.  No obvious effusion on parasternal or apical images.  ?  Compared with the prior echo/Doppler study on 10-03-16, there have been no significant interval changes.    Cardiovascular Health Factors  Vitals BP Readings from Last 3 Encounters:   07/31/20 112/58   07/12/20 117/56   06/12/20 113/63     Wt Readings from Last 3 Encounters:   07/31/20 88 kg (194 lb)   07/12/20 88 kg (194 lb)   06/12/20 86.2 kg (190 lb)     BMI Readings from Last 3 Encounters:   07/31/20 25.60 kg/m?   07/12/20 25.60 kg/m?   06/12/20 25.07 kg/m?      Smoking Social History     Tobacco Use   Smoking Status Never Smoker   Smokeless Tobacco Never Used      Lipid Profile Cholesterol   Date Value Ref Range Status   09/10/2018 117  Final     HDL   Date Value Ref Range Status   09/10/2018 44  Final     LDL   Date Value Ref Range Status   09/10/2018 56  Final     Triglycerides   Date Value Ref Range Status   05/28/2020 101 <150 MG/DL Final      Blood Sugar No results found for: HGBA1C  Glucose   Date Value Ref Range Status   05/30/2020 100 70 - 100 MG/DL Final   16/10/9602 540 (H) 70 - 100 MG/DL Final   98/11/9145 829 (H) 70 - 100 MG/DL Final     Glucose, POC   Date Value Ref Range Status   05/30/2020 130 (H) 70 - 100 MG/DL Final   56/21/3086 578 (H) 70 - 100 MG/DL Final   46/96/2952 841 (H) 70 - 100 MG/DL Final          Problems Addressed Today  Encounter Diagnoses   Name Primary?   ? Coronary artery disease involving native coronary artery of native heart, unspecified whether angina present Yes   ? Primary hypertension    ? PAF (paroxysmal atrial fibrillation) (HCC)        Assessment and Plan     It appears that Mr. Ethlyn Gallery is making steady progress following extensive abdominal surgery in April 2022.  He is very good about participating in [facility] Mr. Newey?reports no?angina or?congestive symptoms.? I have asked  the patient to carry sublingual nitroglycerin at all times. Regular mild aerobic exercise, weight loss and adherence to a heart healthy diet were recommended.??Once again, we spent a long time discussing the importance of observing fall precautions. Pacemaker interrogation reveals no recent paroxysmal atrial fibrillation.?I reviewed with him in detail his pacemaker surveillance schedule. ?I have asked him to obtain remote surveillance of his pacemaker every 3 months with a yearly interrogation in the office. His CHA2DS2-VASc?score appears to be 3 for age and aortic plaque.?The risks and benefits of anticoagulation therapy have been reviewed with the patient. The patient understands that anticoagulation is used to decrease thrombotic or clotting complications associated with atrial fibrillation/flutter, such as stroke and systemic embolization which can be disabling or fatal, but can, on occasion, lead to life-threatening bleeding complications including?gastrointestinal and?intracranial hemorrhage. The patient wishes to?continue?with anticoagulation.?Anticoagulation options were presented to the patient which included warfarin or one of the newer direct acting oral anticoagulants and the patient wanted to continue apixaban for now although he may switch if the cost is prohibitive. His CHA2DS2-Vasc?score is 3?so?that he does not require bridging anticoagulation when he stops his warfarin for procedures or surgery. ?However, Mr. Rucki have to to accept the risk of thrombotic complications such as stroke and systemic embolization,?which could be fatal or disabling, if he decides to hold his warfarin for procedures if required by the administering physician. ?The same pertains to aspirin. ?He would have to accept the risk of stopping aspirin prior to any procedures which require this. I have asked him?to return for follow-up in?6?months.         Current Medications (including today's revisions)  ? acetaminophen (TYLENOL) 500 mg tablet Take 500 mg by mouth twice daily. Max of 4,000 mg of acetaminophen in 24 hours.   ? allopurinol (ZYLOPRIM) 100 mg tablet Take 1 Tab by mouth Daily.   ? apixaban (ELIQUIS) 5 mg tablet Take 5 mg by mouth twice daily.   ? aspirin EC 81 mg tablet Take 81 mg by mouth daily. Take with food.   ? atorvastatin (LIPITOR) 20 mg tablet Take one tablet by mouth daily.   ? coenzyme Q10 100 mg cap Take 50 mg by mouth at bedtime daily.   ? gabapentin (NEURONTIN) 600 mg tablet Take 600 mg by mouth twice daily.   ? hydroxychloroquine (PLAQUENIL) 200 mg PO tablet Take 200 mg by mouth twice daily.   ? ketoconazole (NIZORAL) 2 % topical cream Apply  topically to affected area twice daily.   ? ketoconazole (NIZORAL) 2 % topical shampoo Apply  topically to affected area three times weekly. Apply topically to affected area of damp skin, lather, leave on 5 minutes, and rinse. Repeat 2-3 times per week.   ? lactulose 10 gram/15 mL oral solution Take  by mouth as Needed.   ? loratadine (CLARITIN) 10 mg tablet Take 10 mg by mouth every morning.   ? meclizine (ANTIVERT) 25 mg tablet Take 25 mg by mouth daily.   ? methylcellulose (CITRUCEL PO) Take  by mouth daily.   ? metoprolol XL (TOPROL XL) 25 mg extended release tablet Take one tablet by mouth daily.   ? midodrine (PROAMATINE) 5 mg tablet Take 5 mg by mouth three times daily.   ? mirtazapine (REMERON) 15 mg tablet Take 15 mg by mouth at bedtime daily.   ? nitroglycerin (NITROSTAT) 0.4 mg tablet DISSOLVE ONE TABLET UNDER THE TONGUE EVERY 5 MINUTES AS NEEDED FOR CHEST PAIN.  DO NOT EXCEED A TOTAL OF 3  DOSES IN 15 MINUTES   ? primidone (MYSOLINE) 50 mg tablet Take 4 tablets by mouth at bedtime daily.   ? sennosides/docusate sodium (SENNA PLUS PO) Take  by mouth twice daily.   ? triamcinolone acetonide (KENALOG) 0.1 % topical ointment Apply  topically to affected area twice daily.

## 2020-08-23 IMAGING — CR CHEST
3 series · 3 of 3 positions shown · non-contrast
Comparison: none

[rib upper]
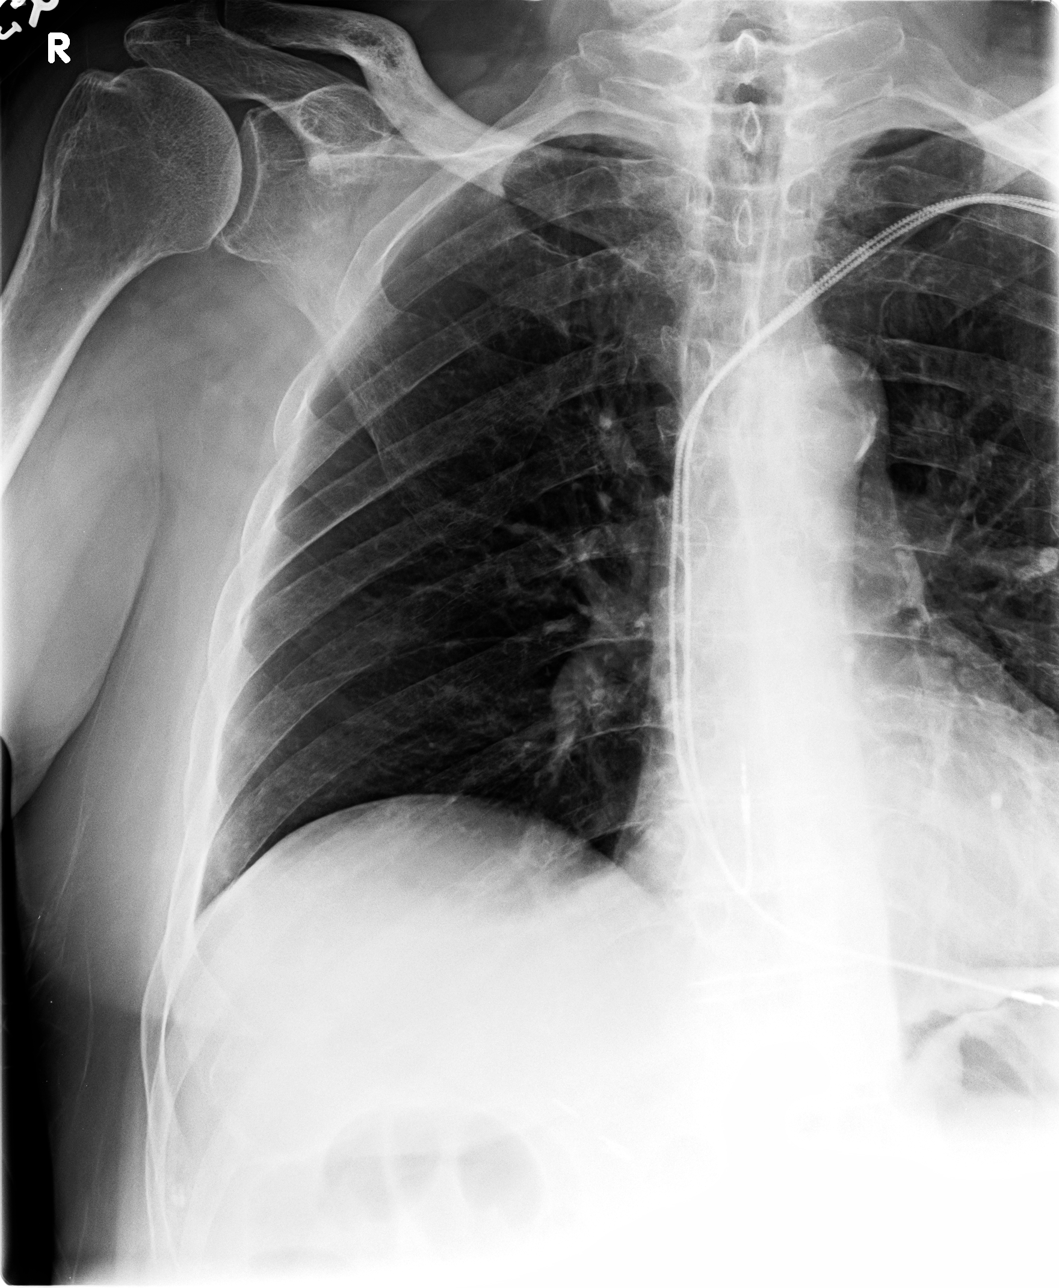

[rib upper obl]
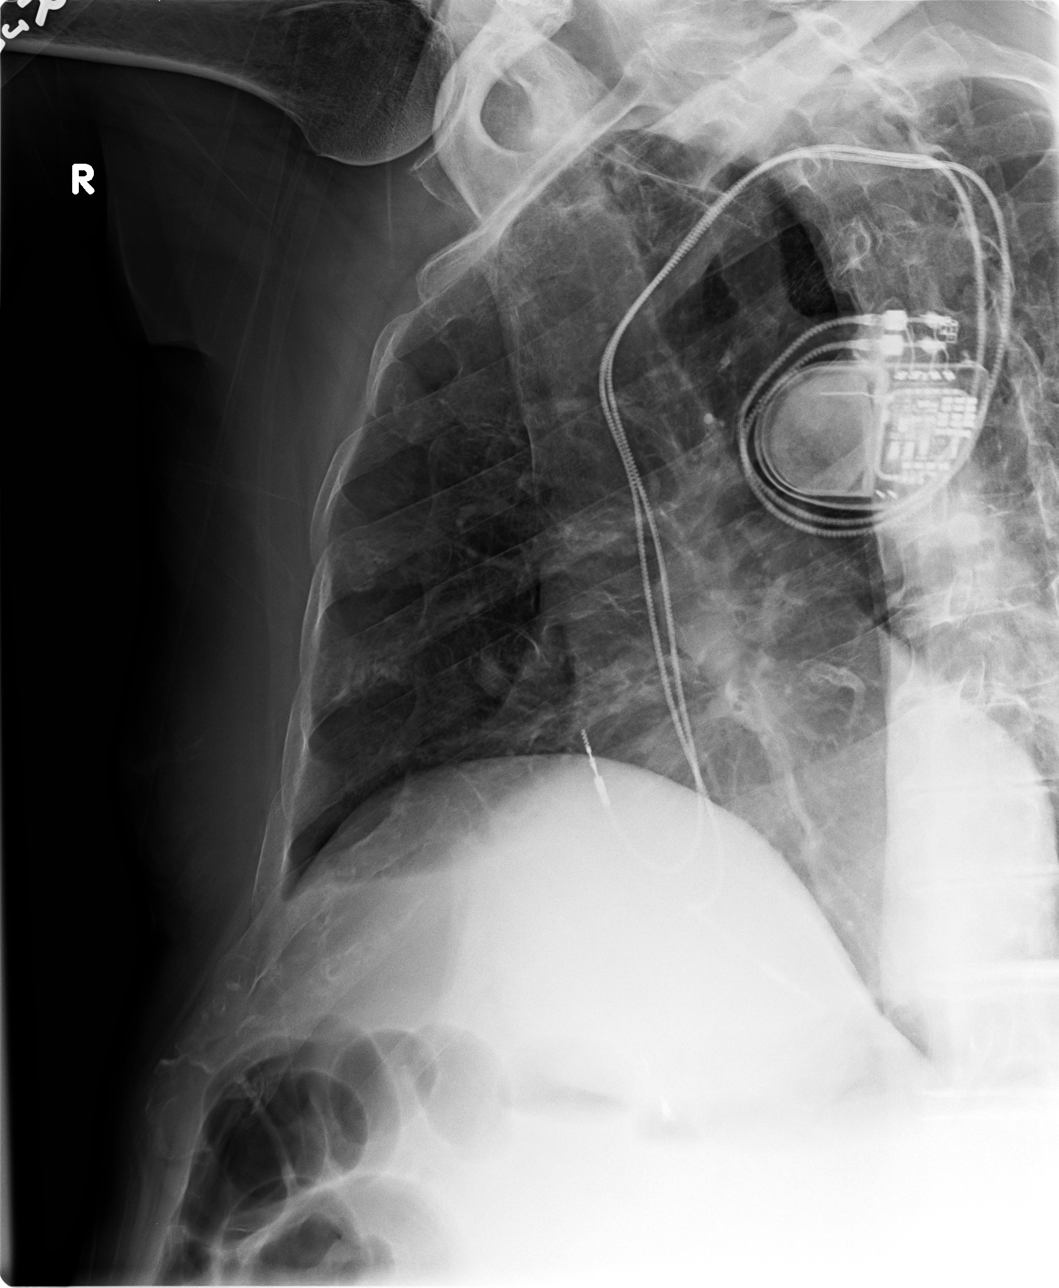

[rib lower]
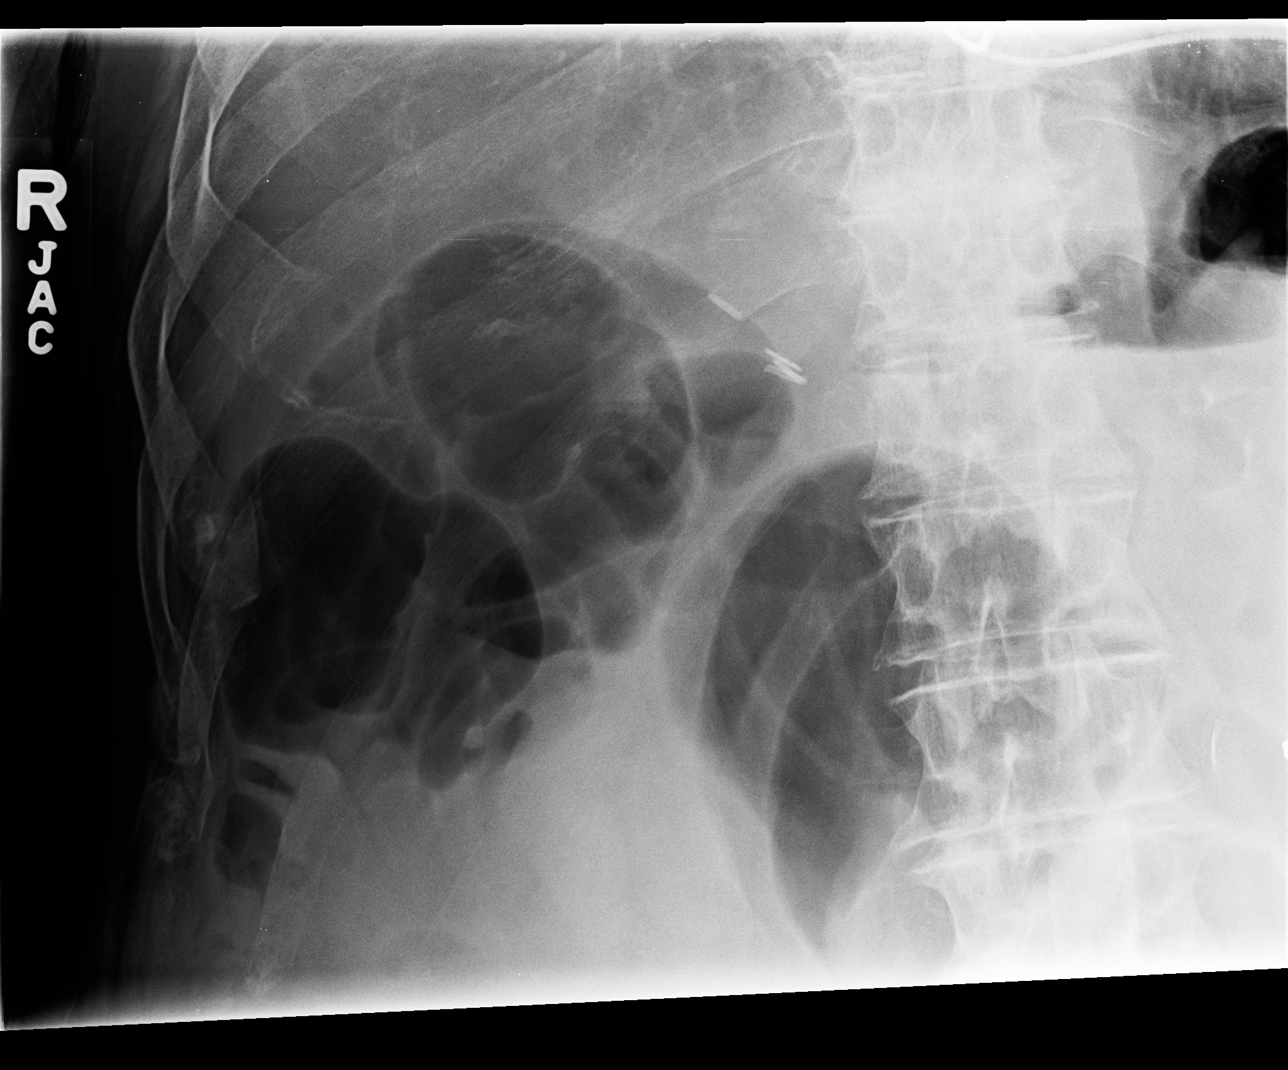

[3 of 3 positions shown; findings below may reference images not displayed]

DIAGNOSTIC STUDIES

EXAM
Right ribs

INDICATION
right rib pain, fell 2 weeks ago
PT c/o Rt rib pain x 2 weeks ago. Fall injury x 2 weeks ago. H/O pacemaker. Bruising of lower RT
quadrant. JC/RG

TECHNIQUE
Detailed views of the right ribs were obtained.

COMPARISONS
October 07, 2015

FINDINGS
Pacemaker is partially visualized. No right rib fractures are seen.

IMPRESSION
No right rib fractures are seen.

Tech Notes:

PT c/o Rt rib pain x 2 weeks ago. Fall injury x 2 weeks ago. H/O pacemaker. Bruising of lower RT
quadrant. JC/RG

## 2020-09-11 ENCOUNTER — Encounter: Admit: 2020-09-11 | Discharge: 2020-09-11 | Payer: MEDICARE

## 2020-09-11 NOTE — Telephone Encounter
returned call to pt.  Let him know remote was received today.

## 2020-10-24 ENCOUNTER — Encounter: Admit: 2020-10-24 | Discharge: 2020-10-24 | Payer: MEDICARE

## 2020-10-25 ENCOUNTER — Encounter: Admit: 2020-10-25 | Discharge: 2020-10-25 | Payer: MEDICARE

## 2020-11-29 ENCOUNTER — Encounter: Admit: 2020-11-29 | Discharge: 2020-11-29 | Payer: MEDICARE

## 2020-11-30 ENCOUNTER — Encounter: Admit: 2020-11-30 | Discharge: 2020-11-30 | Payer: MEDICARE

## 2020-12-14 ENCOUNTER — Encounter

## 2020-12-14 DIAGNOSIS — L219 Seborrheic dermatitis, unspecified: Secondary | ICD-10-CM

## 2020-12-14 MED ORDER — KETOCONAZOLE 2 % TP SHAM
0 refills
Start: 2020-12-14 — End: ?

## 2020-12-14 NOTE — Telephone Encounter
Pharmacy sent fax requesting refills for (rx) ketoconazole 2% shampoo, patient last seen in clinic 10/12/2019 with plan to return in 1 year. Refill request denied, appointment request submitted to provide assistance scheduling follow-up visit. Patient to contact dermatology clinic with any future questions or concerns.  Celine Ahr, LPN

## 2021-01-10 ENCOUNTER — Encounter: Admit: 2021-01-10 | Discharge: 2021-01-10 | Payer: MEDICARE

## 2021-01-10 ENCOUNTER — Ambulatory Visit: Admit: 2021-01-10 | Discharge: 2021-01-10 | Payer: MEDICARE

## 2021-01-10 DIAGNOSIS — Z950 Presence of cardiac pacemaker: Secondary | ICD-10-CM

## 2021-01-10 DIAGNOSIS — R001 Bradycardia, unspecified: Secondary | ICD-10-CM

## 2021-01-23 ENCOUNTER — Encounter: Admit: 2021-01-23 | Discharge: 2021-01-23 | Payer: MEDICARE

## 2021-01-24 ENCOUNTER — Encounter: Admit: 2021-01-24 | Discharge: 2021-01-24 | Payer: MEDICARE

## 2021-02-21 ENCOUNTER — Encounter: Admit: 2021-02-21 | Discharge: 2021-02-21 | Payer: MEDICARE

## 2021-02-21 DIAGNOSIS — E782 Mixed hyperlipidemia: Secondary | ICD-10-CM

## 2021-02-21 DIAGNOSIS — Z950 Presence of cardiac pacemaker: Secondary | ICD-10-CM

## 2021-02-21 DIAGNOSIS — K289 Gastrojejunal ulcer, unspecified as acute or chronic, without hemorrhage or perforation: Secondary | ICD-10-CM

## 2021-02-21 DIAGNOSIS — L814 Other melanin hyperpigmentation: Secondary | ICD-10-CM

## 2021-02-21 DIAGNOSIS — R0989 Other specified symptoms and signs involving the circulatory and respiratory systems: Secondary | ICD-10-CM

## 2021-02-21 DIAGNOSIS — B079 Viral wart, unspecified: Secondary | ICD-10-CM

## 2021-02-21 DIAGNOSIS — M069 Rheumatoid arthritis, unspecified: Secondary | ICD-10-CM

## 2021-02-21 DIAGNOSIS — I110 Hypertensive heart disease with heart failure: Secondary | ICD-10-CM

## 2021-02-21 DIAGNOSIS — L570 Actinic keratosis: Secondary | ICD-10-CM

## 2021-02-21 DIAGNOSIS — L989 Disorder of the skin and subcutaneous tissue, unspecified: Secondary | ICD-10-CM

## 2021-02-21 DIAGNOSIS — I219 Acute myocardial infarction, unspecified: Secondary | ICD-10-CM

## 2021-02-21 DIAGNOSIS — R972 Elevated prostate specific antigen [PSA]: Secondary | ICD-10-CM

## 2021-02-21 DIAGNOSIS — I495 Sick sinus syndrome: Secondary | ICD-10-CM

## 2021-02-21 DIAGNOSIS — E785 Hyperlipidemia, unspecified: Secondary | ICD-10-CM

## 2021-02-21 DIAGNOSIS — L853 Xerosis cutis: Secondary | ICD-10-CM

## 2021-02-21 DIAGNOSIS — I200 Unstable angina: Secondary | ICD-10-CM

## 2021-02-21 DIAGNOSIS — I10 Essential (primary) hypertension: Secondary | ICD-10-CM

## 2021-02-21 DIAGNOSIS — I2510 Atherosclerotic heart disease of native coronary artery without angina pectoris: Secondary | ICD-10-CM

## 2021-02-21 DIAGNOSIS — IMO0002 Squamous cell carcinoma: Secondary | ICD-10-CM

## 2021-02-21 DIAGNOSIS — I480 Paroxysmal atrial fibrillation: Secondary | ICD-10-CM

## 2021-02-21 DIAGNOSIS — K529 Noninfective gastroenteritis and colitis, unspecified: Secondary | ICD-10-CM

## 2021-02-21 DIAGNOSIS — D692 Other nonthrombocytopenic purpura: Secondary | ICD-10-CM

## 2021-02-21 DIAGNOSIS — I8390 Asymptomatic varicose veins of unspecified lower extremity: Secondary | ICD-10-CM

## 2021-02-21 LAB — BASIC METABOLIC PANEL
ANION GAP: 7 g/dL — ABNORMAL LOW (ref 8–16)
CALCIUM: 8.5 mg/dL — ABNORMAL LOW (ref 8.8–10.0)
CO2: 27 MMOL/L (ref 3.5–5.1)
POTASSIUM: 4.4
SODIUM: 140

## 2021-02-21 MED ORDER — RIVAROXABAN 20 MG PO TAB
20 mg | ORAL_TABLET | Freq: Every day | ORAL | 3 refills | 30.00000 days | Status: DC
Start: 2021-02-21 — End: 2021-02-21

## 2021-02-21 MED ORDER — RIVAROXABAN 20 MG PO TAB
20 mg | ORAL_TABLET | Freq: Every day | ORAL | 3 refills | 30.00000 days | Status: AC
Start: 2021-02-21 — End: ?

## 2021-02-21 NOTE — Progress Notes
Date of Service: 02/21/2021    Kenneth Robles SHALL is a 88 y.o. male.       HPI    [name] is followed for coronary artery disease and paroxysmal atrial fibrillation.? He has had a slow but steady recovery following abdominal surgery for small bowel obstruction in April 2022.  He continues to perform pool exercises at the [facility] 3 times a week for at least an hour each session. He walks mostly with a cane and can walk a block or 2 at a time.The patient reports that he is working with his urologist for continued treatment of prostate cancer. ?He indicates that he has received radiation therapy previously. ?He reports no recent falls.Kenneth Robles ?Otherwise, over the past?6?months?[name] has felt well?and?reports no angina,?congestive symptoms, palpitations, sensation of sustained forceful heart pounding, or syncope.??I do not believe that he is aware when he has paroxysmal atrial fibrillation. ?His exercise tolerance has been stable.???The patient reports no myalgias, bleeding abnormalities,?or strokelike symptoms.??He?continues to have chronic back,hip and knee pain for which he sees a rheumatologist and orthopedic surgeon.??He also received some type of local therapy on his feet for peripheral neuropathy which helps. 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 ?His CHA2DS2-Vasc?score is 3?and he currently takes rivaroxaban.  For some reason rivaroxaban was much more affordable than apixaban.  ?In August 2017,?his atorvastatin dose was decreased to 20 mg daily because of myalgias, and he is?tolerating this dose?without adverse effects. [name] reported orthostasis in late August 2018 and his Imdur was stopped. ?This has improved his orthostasis considerably.His Flomax was stopped for similar reasons.  ??  [name] past medical history is noteworthy for permanent pacemaker placement on 01/22/06. On 09/14/06 he underwent atrial fibrillation ablation complicated by perciardial effusion requiring pericardiocentesis. He underwent successful PCI of the mid and proximal LAD with 2 overlapping Xience drug-eluting stents, a 2.5 x 28 and a 3.0 x 15 Xience drug-eluting stent from distal to proximal on 05/23/09. Coronary angiography performed on 01/07/13 revealed ?  1. Moderate ostial LAD stenosis 40 percent to 50 percent, with a haziness in the mid LAD segment. The proximal LAD was widely patent and the FFR of the LAD was 0.84 at maximal hyperemia.  2. Chronic total occlusion of a high obtuse marginal branch with left-to-left collaterals, which was unchanged from the prior study dated 2011.   3. Normal left ventricular end-diastolic pressures.  [name] was seen on 06/08/14 after his pacemaker had reached elective replacement interval and had defaulted to VVI pacing. At that time he had developed dyspnea with exertion. He underwent pacemaker generator replacement on 06/12/14. He tripped over a tool in his garage on 03/10/15 resulting in multiple ecchymosis to his face. He indicates that he was evaluated and that there were no serious injuries.?[name] saw Dr. Wallene Huh on 08/13/2015 because R-wave sensing on the right ventricular lead of his pacemaker had decreased. ?No action was required. The patient was hospitalized locally on September 03, 2016 for right lower lobe infiltrate/pneumonia was treated initially with Zosyn and then with Augmentin and albuterol inhaler. ?Apparently he had paroxysmal atrial fibrillation at the same time which resolved spontaneously.?The patient was hospitalized on December 09, 2016 for chest discomfort. ?There was no evidence for an acute coronary syndrome. ?Coronary angiography was performed which?showed mild to moderate nonobstructive coronary disease without high-grade stenosis. ?No coronary?intervention was required.??On November 02, 2017 while at  the pool, he he stood up too quickly and became momentarily lightheaded and fell striking his head. A?CT scan of the spine and head work was obtained but showed no major bleeding.  On 05/12/2020 he developed small bowel obstruction.  He underwent exploratory laparotomy that day at [facility] hospital was found to have for ischemic enteritis.  He underwent repeat exploratory laparotomy on 05/14/2020.        Vitals:    02/21/21 0917   BP: 112/62   BP Source: Arm, Left Upper   Pulse: 65   SpO2: 95%   O2 Device: None (Room air)   PainSc: Zero   Weight: 94.2 kg (207 lb 9.6 oz)   Height: 185.4 cm (6' 1)     Body mass index is 27.39 kg/m?Kenneth Robles     Past Medical History  Patient Active Problem List    Diagnosis Date Noted   ? Hypertensive heart disease with heart failure (HCC) 02/21/2021   ? Postoperative follow-up 07/13/2020   ? Acute blood loss anemia 05/15/2020   ? Overweight (BMI 25.0-29.9) 05/15/2020   ? Acute postoperative pain of abdomen 05/15/2020   ? Small bowel obstruction (HCC) 05/14/2020   ? Rheumatoid arthritis(714.0) 03/09/2014   ? Left-sided low back pain without sciatica 03/02/2014   ? Lumbar radiculopathy 03/02/2014   ? Spondylosis of lumbar region without myelopathy or radiculopathy 03/02/2014   ? 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 Pigmented skin lesions - bilateral ear 11/25/2009     Solar lentigo with actinic purpura       ? CAD (coronary artery disease) 07/05/2009     Admitted with unstable angina. Cardiac cath on 05/23/2009 with successful PCI with overlapping Xience drug-eluting stents, a 2.5 x 28 and a 2.5 x 15 from distal to proximal, postdilated with a 2.75 and subsequently a 3.0 balloon.    08/29/2015 - Stress Test:  This study is mildly abnormal, indicative of a small mild intensity area of reversible ischemia involving the distal inferior wall.  The appearance is very similar to what was seen in 2012. The left ventricular systolic function is normal. There are no high risk prognostic indicators present.       ? Osteoarthritis 05/21/2009   ? PAF (paroxysmal atrial fibrillation) (HCC) 09/14/2006     Paroxysmal symptomatic atrial fibrillation controlled with Tikosyn. Previous left atrial ablation attempt in August 2008 with Dr. Kathleene Hazel. Unfortunately, it was complicated by cardiac tamponade, but the patient did well subsequently. He was placed on Tikosyn which seems to control his arrhythmia very well.     04/2010. Mr Bitting declined restarting Tikosyn because atrial fibrillation wasn't frequent and it was very expensive. Target INR is 2.5.     ? Hyperlipidemia 09/14/2006     Mr. Buice has had problems with statin myalgias and side effects from niacin. He declines any future lipid lower therapies.     ? Cardiac pacemaker in situ 09/14/2006   ? Sinus bradycardia 01/22/2006   ? HTN (hypertension) 01/22/2006   ? CRI (chronic renal insufficiency) 01/22/2006         Review of Systems   Constitutional: Negative.   HENT: Negative.    Eyes: Negative.    Cardiovascular: Negative.    Respiratory: Positive for shortness of breath.    Endocrine: Negative.    Hematologic/Lymphatic: Negative.    Skin: Negative.    Musculoskeletal: Negative.    Gastrointestinal: Negative.    Genitourinary: Negative.  Neurological: Negative.    Psychiatric/Behavioral: Negative.    Allergic/Immunologic: Negative.        Physical Exam  GENERAL: The patient is well developed, well nourished, resting comfortably and in no distress. ?  HEENT: No abnormalities of the visible oro-nasopharynx, conjunctiva or sclera are noted.??Wearing hearing aids.  NECK: There is no jugular venous distension. Carotids are palpable and without bruits. There is no thyroid enlargement.  Chest: Lung fields are clear to auscultation. There are no wheezes or crackles.?His pacemaker incision in left infraclavicular area looks good?without?erosion or?cellulitis.  CV: There is a regular rhythm. The first and second heart sounds are normal. There are no murmurs, gallops or rubs. His apical heart rate is?76?BPM.  ABD: The abdomen is soft and supple with normal bowel sounds. There is no hepatosplenomegaly, ascites, tenderness, masses or bruits.??His abdominal incision is healed nicely.  Neuro: There are no focal motor defects. Ambulation is normal. Cognitive function appears normal.  Ext:?There is no edema or evidence of deep vein thrombosis. Good radial pulses. Good posterior tibial pulses. Decreased right dorsalis pulse but good capillary refill and no ischemic ulcers.  SKIN:?No cellulitis or skin rashes noted.  PSYCH:?The patient is calm, rationale and oriented.    Cardiovascular Studies  A twelve-lead ECG obtained on 02/21/2021 shows an atrial paced rhythm with a heart rate of 73 bpm.  Right bundle branch block and left anterior fascicular block are noted.    Remote pacemaker interrogation 01/23/2021:  Interpretation Summary    Title: Normal Remote: No Events    * Normal Device Function  * Alerts or events: None  * Battery: OK, 3.67 yrs   * Sensing, impedance and thresholds reviewed  * Programmed parameters reviewed  * Presenting rhythm APVS 60sbpm  * Heart Rate Histograms reviewed  * No significant changes noted    Coronary angiography 12/09/2016:  1. Left main coronary artery: ?The left main coronary artery arises normally from left coronary sinus. ?Distally, it bifurcates into the left anterior descending artery and left circumflex artery. ?It is angiographically free of significant disease.  2. Left anterior descending artery:??The left anterior descending artery appears to be a large-caliber vessel which has previously placed stents extending from the proximal to mid LAD in overlapping fashion. ?The stents appear to be patent, with 30% in-stent restenosis in the midportion of the stent. ?The ostial LAD has 40% to 50% disease. ?The midportion of the LAD has 20% disease just distal to the previously placed stents. ?The diagonal vessels appear to be small caliber, without significant disease.  3. Left circumflex artery:??The left circumflex artery appears to be a large-caliber vessel which has 20% disease in the proximal region. ?The 1st obtuse marginal branch is known to be chronically totally occluded. ?It appears to be filling from left-to-left collaterals. ?The 2nd and 3rd obtuse marginal branches appear to be free of significant disease.   4. Right coronary artery:??The right coronary artery has an anterior and somewhat low takeoff and arises from the right coronary sinus. ?It distally bifurcates into PDA and PLV branches. ?It is a large dominant vessel. ?The RCA has 20% disease extending from the proximal midportion to the mid distal portion. ?The PDA and PLV branches appear to be free of significant disease.  ??  FINAL IMPRESSION:??  1. Mild to moderate nonobstructive coronary artery disease,which appears to be unchanged from prior cath, as described above.  2. Patent LAD stent with mild in-stent restenosis  3. Normal LVEDP.  4. No significant gradient across the aortic  valve on pullback.  ?  RECOMMENDATIONS:??The present study appears to be grossly unchanged compared to a prior study in 2014. ?Therefore, we will recommend medical management at this time.  ?  Echo Doppler 05/12/2020:  Interpretation Summary  ?  1. Normal LV cavity size and systolic function, EF ~ 65%  2. No evidence of LV thrombus with use of echo contrast  3. Right ventricle was not well visualized on the study for quantitative assessment. ?Subjectively it is normal in size and systolic function  4. Atria were not well visualized on the study  5. Device lead visualized within the right ventricle  6. Mild central aortic regurgitation  7. The estimated pulmonary artery systolic pressure is 31 mmHg + right atrial pressure (IVC not well visualized on this study)  8. Subcostal images were non-diagnostic. ?No obvious effusion on parasternal or apical images.  ?  Compared with the prior echo/Doppler study on 10-03-16, there have been no significant interval changes.  Cardiovascular Health Factors  Vitals BP Readings from Last 3 Encounters:   02/21/21 112/62   07/31/20 112/58   07/12/20 117/56     Wt Readings from Last 3 Encounters:   02/21/21 94.2 kg (207 lb 9.6 oz)   07/31/20 88 kg (194 lb)   07/12/20 88 kg (194 lb)     BMI Readings from Last 3 Encounters:   02/21/21 27.39 kg/m?   07/31/20 25.60 kg/m?   07/12/20 25.60 kg/m?      Smoking Social History     Tobacco Use   Smoking Status Never   Smokeless Tobacco Never      Lipid Profile Cholesterol   Date Value Ref Range Status   09/10/2018 117  Final     HDL   Date Value Ref Range Status   09/10/2018 44  Final     LDL   Date Value Ref Range Status   09/10/2018 56  Final     Triglycerides   Date Value Ref Range Status   05/28/2020 101 <150 MG/DL Final      Blood Sugar No results found for: HGBA1C  Glucose   Date Value Ref Range Status   05/30/2020 100 70 - 100 MG/DL Final   16/10/9602 540 (H) 70 - 100 MG/DL Final   98/11/9145 829 (H) 70 - 100 MG/DL Final     Glucose, POC   Date Value Ref Range Status   05/30/2020 130 (H) 70 - 100 MG/DL Final   56/21/3086 578 (H) 70 - 100 MG/DL Final   46/96/2952 841 (H) 70 - 100 MG/DL Final          Problems Addressed Today  Encounter Diagnoses   Name Primary?   ? Cardiovascular symptoms Yes   ? Hypertensive heart disease with heart failure (HCC)    ? Sick sinus syndrome (HCC)    ? PAF (paroxysmal atrial fibrillation) (HCC)    ? Primary hypertension    ? Mixed hyperlipidemia    ? Cardiac pacemaker in situ        Assessment and Plan     It appears that Mr. Mcgavock has improved following extensive abdominal surgery in April 2022.  He is able to exercise in the pool for an hour at a time without difficulty 3 times a week.  Sometimes he becomes a little dyspneic walking a block or 2.  However, he reports no edema, weight gain, orthopnea or nocturnal dyspnea suggestive for decompensated heart failure.  Mr. Bochenek?reports no?angina or?congestive symptoms.??I have asked  the patient to carry sublingual nitroglycerin at all times. Regular mild aerobic exercise, weight loss and adherence to a heart healthy diet were recommended.??Once again, we spent a long time discussing the importance of observing fall precautions.?Pacemaker interrogation reveals no recent paroxysmal atrial fibrillation.?I reviewed with him in detail his pacemaker surveillance schedule. ?I have asked him to obtain remote surveillance of his pacemaker every 3 months with a yearly interrogation in the office. His CHA2DS2-VASc?score appears to be 3 for age and aortic plaque.?The risks and benefits of anticoagulation therapy have been reviewed with the patient. The patient understands that anticoagulation is used to decrease thrombotic or clotting complications associated with atrial fibrillation/flutter, such as stroke and systemic embolization which can be disabling or fatal, but can, on occasion, lead to life-threatening bleeding complications including?gastrointestinal and?intracranial hemorrhage. The patient wishes to?continue?with anticoagulation.?Anticoagulation options were presented to the patient which included warfarin or one of the newer direct acting oral anticoagulants and the patient wanted to continue rivaroxaban.  He was given a requisition to check his Chem-7 today to ascertain the correct dosage of rivaroxaban. His CHA2DS2-Vasc?score is 3?so?that he does not require bridging anticoagulation when he stops his rivaroxaban for procedures or surgery. ?However, Mr. Blethen have to to accept the risk of thrombotic complications such as stroke and systemic embolization,?which could be fatal or disabling, if he decides to hold his rivaroxaban for procedures if required by the procedural physician. ?The same pertains to aspirin. ?He would have to accept the risk of stopping aspirin prior to any procedures which require this.?I have asked him?to return for follow-up in?6?months. The total time spent during this interview and exam with preparation and chart review was 30 minutes.         Current Medications (including today's revisions)  ? acetaminophen (TYLENOL) 500 mg tablet Take 500 mg by mouth twice daily. Max of 4,000 mg of acetaminophen in 24 hours.   ? allopurinol (ZYLOPRIM) 100 mg tablet Take 1 Tab by mouth Daily.   ? aspirin EC 81 mg tablet Take 81 mg by mouth daily. Take with food.   ? atorvastatin (LIPITOR) 20 mg tablet Take one tablet by mouth daily.   ? coenzyme Q10 100 mg cap Take 50 mg by mouth at bedtime daily.   ? diclofenac sodium (VOLTAREN) 1 % topical gel Apply 4 g topically to affected area three times daily as needed.   ? FAMOTIDINE PO Take  by mouth as Needed.   ? gabapentin (NEURONTIN) 600 mg tablet Take 600 mg by mouth twice daily.   ? hydroxychloroquine (PLAQUENIL) 200 mg PO tablet Take 200 mg by mouth twice daily.   ? lactulose 10 gram/15 mL oral solution Take  by mouth as Needed.   ? loratadine (CLARITIN) 10 mg tablet Take 10 mg by mouth every morning.   ? meclizine (ANTIVERT) 25 mg tablet Take 25 mg by mouth daily.   ? metoclopramide HCL (REGLAN) 5 mg tablet Take 5 mg by mouth as Needed for Nausea, Vomiting or Other....   ? metoprolol XL (TOPROL XL) 25 mg extended release tablet Take one tablet by mouth daily.   ? mirtazapine (REMERON) 15 mg tablet Take 15 mg by mouth at bedtime daily.   ? nitroglycerin (NITROSTAT) 0.4 mg tablet DISSOLVE ONE TABLET UNDER THE TONGUE EVERY 5 MINUTES AS NEEDED FOR CHEST PAIN.  DO NOT EXCEED A TOTAL OF 3 DOSES IN 15 MINUTES   ? polyethylene glycol 3350 (MIRALAX PO) Take  by mouth.   ? primidone (MYSOLINE)  50 mg tablet Take 4 tablets by mouth at bedtime daily.   ? rivaroxaban (XARELTO) 15 mg tablet Take 15 mg by mouth daily. Take with food.   ? sennosides/docusate sodium (SENNA PLUS PO) Take  by mouth twice daily.

## 2021-02-21 NOTE — Patient Instructions
Thank you for visiting our office today.    We would like to make the following medication adjustments:  NONE      Otherwise continue the same medications as you have been doing.          We will be pursuing the following tests after your appointment today:       Orders Placed This Encounter    BASIC METABOLIC PANEL    ECG 00-FRTM         We will plan to see you back in 6 months with FASTING labs.  Please call us in the meantime with any questions or concerns.        Please allow 5-7 business days for our providers to review your results. All normal results will go to MyChart. If you do not have Mychart, it is strongly recommended to get this so you can easily view all your results. If you do not have mychart, we will attempt to call you once with normal lab and testing results. If we cannot reach you by phone with normal results, we will send you a letter.  If you have not heard the results of your testing after one week please give [place] a call.       Your [facility] Richardson Landry, Rene Kocher, Threasa Beards, and [place]on[place])  phone number is [phone].

## 2021-02-21 NOTE — Telephone Encounter
Reviewed lab results with Dr. Jacklyn Shell in clinic today. With patient's updated CrCl patient can be on 20mg  Xarelto daily.       Called and discussed lab results and Dr. Prentice Docker recommendations with patient. Patient is agreeable to care plan and has no further questions at this time.       Called patient's PCP, Dr. Golden Hurter office to inform of medication change.

## 2021-04-10 ENCOUNTER — Encounter: Admit: 2021-04-10 | Discharge: 2021-04-10 | Payer: MEDICARE

## 2021-04-10 DIAGNOSIS — E782 Mixed hyperlipidemia: Secondary | ICD-10-CM

## 2021-04-10 MED ORDER — METOPROLOL SUCCINATE 25 MG PO TB24
ORAL_TABLET | ORAL | 3 refills | 90.00000 days | Status: AC
Start: 2021-04-10 — End: ?

## 2021-04-10 MED ORDER — ATORVASTATIN 20 MG PO TAB
ORAL_TABLET | 3 refills | Status: AC
Start: 2021-04-10 — End: ?

## 2021-04-11 ENCOUNTER — Encounter: Admit: 2021-04-11 | Discharge: 2021-04-11 | Payer: MEDICARE

## 2021-04-11 NOTE — Progress Notes
Pt at [facility] pain and  n/v for 12 hours. Bowel obstruction. Vomited large amount in ER.     CT abd: focal ileus or prox sbo, or enteritis of duodenum.       Vitals  126/64, 67hr, 97.6, 16rr, 96%ra    Ekg and trop neg  Labs  Wbc 8.3  H/12.8/38.3  Plt 163  Lactic 1.5  Gluc 122    Med   IVF running  '4mg'$  zofran x2     Reason for transfer: pt request

## 2021-04-24 ENCOUNTER — Encounter: Admit: 2021-04-24 | Discharge: 2021-04-24 | Payer: MEDICARE

## 2021-04-25 ENCOUNTER — Encounter: Admit: 2021-04-25 | Discharge: 2021-04-25 | Payer: MEDICARE

## 2021-05-02 ENCOUNTER — Ambulatory Visit: Admit: 2021-05-02 | Discharge: 2021-05-02 | Payer: MEDICARE

## 2021-05-02 ENCOUNTER — Encounter: Admit: 2021-05-02 | Discharge: 2021-05-02 | Payer: MEDICARE

## 2021-05-02 DIAGNOSIS — R0602 Shortness of breath: Secondary | ICD-10-CM

## 2021-05-02 DIAGNOSIS — I4891 Unspecified atrial fibrillation: Secondary | ICD-10-CM

## 2021-05-02 DIAGNOSIS — I2510 Atherosclerotic heart disease of native coronary artery without angina pectoris: Secondary | ICD-10-CM

## 2021-05-02 DIAGNOSIS — I509 Heart failure, unspecified: Secondary | ICD-10-CM

## 2021-05-02 DIAGNOSIS — R079 Chest pain, unspecified: Secondary | ICD-10-CM

## 2021-05-03 ENCOUNTER — Encounter: Admit: 2021-05-03 | Discharge: 2021-05-03 | Payer: MEDICARE

## 2021-05-03 NOTE — Telephone Encounter
Patient notified.

## 2021-05-03 NOTE — Telephone Encounter
-----   Message from Nehemiah Massed, MD sent at 05/03/2021 11:53 AM CDT -----  Kenneth Robles: Favorable stress study.  Please let the patient and Dr. Isidore Moos know.  Thanks.  SBG  ----- Message -----  From: Samuel Jester, MD  Sent: 05/03/2021  10:04 AM CDT  To: Nehemiah Massed, MD

## 2021-05-08 ENCOUNTER — Encounter: Admit: 2021-05-08 | Discharge: 2021-05-08 | Payer: MEDICARE

## 2021-05-08 ENCOUNTER — Ambulatory Visit: Admit: 2021-05-08 | Discharge: 2021-05-09 | Payer: MEDICARE

## 2021-05-08 DIAGNOSIS — B079 Viral wart, unspecified: Secondary | ICD-10-CM

## 2021-05-08 DIAGNOSIS — I110 Hypertensive heart disease with heart failure: Secondary | ICD-10-CM

## 2021-05-08 DIAGNOSIS — D229 Melanocytic nevi, unspecified: Secondary | ICD-10-CM

## 2021-05-08 DIAGNOSIS — L989 Disorder of the skin and subcutaneous tissue, unspecified: Secondary | ICD-10-CM

## 2021-05-08 DIAGNOSIS — I8390 Asymptomatic varicose veins of unspecified lower extremity: Secondary | ICD-10-CM

## 2021-05-08 DIAGNOSIS — I219 Acute myocardial infarction, unspecified: Secondary | ICD-10-CM

## 2021-05-08 DIAGNOSIS — K055 Other periodontal diseases: Secondary | ICD-10-CM

## 2021-05-08 DIAGNOSIS — R972 Elevated prostate specific antigen [PSA]: Secondary | ICD-10-CM

## 2021-05-08 DIAGNOSIS — L821 Other seborrheic keratosis: Secondary | ICD-10-CM

## 2021-05-08 DIAGNOSIS — L299 Pruritus, unspecified: Secondary | ICD-10-CM

## 2021-05-08 DIAGNOSIS — K130 Diseases of lips: Secondary | ICD-10-CM

## 2021-05-08 DIAGNOSIS — D692 Other nonthrombocytopenic purpura: Secondary | ICD-10-CM

## 2021-05-08 DIAGNOSIS — I200 Unstable angina: Secondary | ICD-10-CM

## 2021-05-08 DIAGNOSIS — L219 Seborrheic dermatitis, unspecified: Secondary | ICD-10-CM

## 2021-05-08 DIAGNOSIS — L853 Xerosis cutis: Secondary | ICD-10-CM

## 2021-05-08 DIAGNOSIS — L570 Actinic keratosis: Secondary | ICD-10-CM

## 2021-05-08 DIAGNOSIS — L814 Other melanin hyperpigmentation: Secondary | ICD-10-CM

## 2021-05-08 DIAGNOSIS — M069 Rheumatoid arthritis, unspecified: Secondary | ICD-10-CM

## 2021-05-08 DIAGNOSIS — I10 Essential (primary) hypertension: Secondary | ICD-10-CM

## 2021-05-08 DIAGNOSIS — K289 Gastrojejunal ulcer, unspecified as acute or chronic, without hemorrhage or perforation: Secondary | ICD-10-CM

## 2021-05-08 DIAGNOSIS — Z85828 Personal history of other malignant neoplasm of skin: Secondary | ICD-10-CM

## 2021-05-08 DIAGNOSIS — K529 Noninfective gastroenteritis and colitis, unspecified: Secondary | ICD-10-CM

## 2021-05-08 DIAGNOSIS — E785 Hyperlipidemia, unspecified: Secondary | ICD-10-CM

## 2021-05-08 DIAGNOSIS — IMO0002 Degenerative disc disease: Secondary | ICD-10-CM

## 2021-05-08 DIAGNOSIS — I2510 Atherosclerotic heart disease of native coronary artery without angina pectoris: Secondary | ICD-10-CM

## 2021-05-08 MED ORDER — DESONIDE 0.05 % TP CREA
3 refills | Status: AC
Start: 2021-05-08 — End: ?

## 2021-05-08 NOTE — Progress Notes
Date of Service: 05/08/2021    Subjective:             Kenneth Robles is a 88 y.o. male.    History of Present Illness  Return pt. LV 10/12/19.  ?  # H/o squamous cell carcinoma   - L superior helix?s/p Mohs 2015?  ?  # Actinic Keratosis/ Actinic Chelitis  - LN2 and PDT in the past. ?  ?  # Melanocytic nevi distributed over the head, trunk, arms and legs.  - H/o?blistering sunburns  ?  # Seb derm  - previously prescribed keto shampoo, thought this was too drying   - currently using OTC shampoo with conditioner and baby oil    # Lesion on the L anterior helix  - noted recently d/t textural change  - bump, irritation in this area  - pt wears glasses and hearing aids    # Dry lips  - pt regularly licks, bites lips  - asymptomatic    # Itching on the back  - no routine moisturizer  ?  Family Hx: no?h/o?skin cancer. 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       Review of Systems   Constitutional: Negative for appetite change and unexpected weight change.   Gastrointestinal: Negative for diarrhea, nausea and vomiting.         Objective:         ? acetaminophen (TYLENOL) 500 mg tablet Take one tablet by mouth twice daily. Max of 4,000 mg of acetaminophen in 24 hours.   ? allopurinol (ZYLOPRIM) 100 mg tablet Take one tablet by mouth daily.   ? aspirin EC 81 mg tablet Take one tablet by mouth daily. Take with food.   ? atorvastatin (LIPITOR) 20 mg tablet TAKE 1 TABLET EVERY DAY   ? coenzyme Q10 100 mg cap Take 50 mg by mouth at bedtime daily.   ? diclofenac sodium (VOLTAREN) 1 % topical gel Apply four g topically to affected area three times daily as needed.   ? FAMOTIDINE PO Take  by mouth as Needed.   ? gabapentin (NEURONTIN) 600 mg tablet Take one tablet by mouth twice daily.   ? hydroxychloroquine (PLAQUENIL) 200 mg PO tablet Take one tablet by mouth twice daily.   ? lactulose 10 gram/15 mL oral solution Take  by mouth as Needed.   ? loratadine (CLARITIN) 10 mg tablet Take one tablet by mouth every morning.   ? meclizine (ANTIVERT) 25 mg tablet Take one tablet by mouth daily.   ? metoclopramide HCL (REGLAN) 5 mg tablet Take one tablet by mouth as Needed for Nausea, Vomiting or Other....   ? metoprolol succinate XL (TOPROL XL) 25 mg extended release tablet TAKE 1 TABLET EVERY DAY   ? mirtazapine (REMERON) 15 mg tablet Take one tablet by mouth at bedtime daily.   ? nitroglycerin (NITROSTAT) 0.4 mg tablet DISSOLVE ONE TABLET UNDER THE TONGUE EVERY 5 MINUTES AS NEEDED FOR CHEST PAIN.  DO NOT EXCEED A TOTAL OF 3 DOSES IN 15 MINUTES   ? polyethylene glycol 3350 (MIRALAX PO) Take  by mouth.   ? primidone (MYSOLINE) 50 mg tablet Take four tablets by mouth at bedtime daily.   ? rivaroxaban (XARELTO) 20 mg tablet Take one tablet by mouth daily. Take with food.   ? sennosides/docusate sodium (SENNA PLUS PO) Take  by mouth twice daily.     Vitals:    05/08/21 1415  PainSc: Two   Weight: 93.9 kg (207 lb)   Height: 185.4 cm (6' 1)     Body mass index is 27.31 kg/m?[name]     Physical Exam  Areas Examined (all normal unless noted below):  Head/Face  Neck  Back    Pt declines FBSE  ?  Pertinent findings include:  Scar at site of prior SCC on L superior helix  Soft, pigmented, stuck-on-appearing papules are distributed over the examined areas.? All have symmetric pebbled dermoscopic findings.  Multiple brown and tan evenly pigmented macules are distributed over the examined areas.? All have symmetric similar dermascopic findings with primarily globular and reticular patterns.  2 erythematous scaly papule(s) are noted on the nose  Scaling of b/l upper and lower cutaneous lips  L anterior helix with erythematous papule  Moderate diffuse xerosis       Assessment and Plan:  # History of squamous cell carcinoma in 2015?  -?No evidence of recurrence  ?  # Actinic Keratosis  -Discussed diagnosis, etiology, and recommended treatment options  -Discussed risks and benefits of LN2 including expected blistering/scabbing with possibility of depigmentation and scarring.  -LN2 -f/t/f x 2 lesions  -Counseled to call for reevaluation if lesions do not resolve  -sunprotection advised    # Melanocytic nevi  - Will cont to monitor  - RTC for new/changing lesions  ?  # Seborrheic keratoses  - reassurance  ?  # Seb derm- well controlled  - reports keto shampoo was too drying in the past  - continue OTC anti-dandruff shampoo  ?  # Acanthoma fissuratum  - counseled on diagnosis, etiology  - start Rx desonide cream BID PRN    # Cheilitis  - counseled on etiology related to chronic irritation  - encouraged to avoid biting, licking lips as much as possible  - start frequent plain vaseline to lips  - start Rx desonide cream BID PRN    # Xerosis  # Pruritus  - recommend frequent bland emollient. Provided with recommendations in AVS for gentle skin care  - start Rx desonide cream BID PRN for flaring/pruritus    RTC?6 months    Liquid Nitrogen Procedure Note     Risk and benefits of the above procedure including pain, dyspigmentation, scar, infection, recurrence were discussed with the patient (or legal guardian) in detail, who afterwards decided to proceed with the procedure.    Verbal informed consent given  Diagnosis: see progress note  Body site: see progress note  Number of lesions: see progress note  Cycle duration: 10 sec  Number or cycles: 2   Wound care instructions given: Yes  Complications:  None  Tolerated well:  Yes  Ambulated from room:  Yes  Duration of procedure: > 

## 2021-05-08 NOTE — Patient Instructions
Gentle Skin care recommendations:    - Cetaphil Cream - moisturize twice daily  - Dove or Cetaphil body soap  - Cetaphil or CeraVe facial cleanser  - All Free and Clear or Tide Free and Gentle detergent

## 2021-06-25 ENCOUNTER — Encounter: Admit: 2021-06-25 | Discharge: 2021-06-25 | Payer: MEDICARE

## 2021-07-25 ENCOUNTER — Encounter: Admit: 2021-07-25 | Discharge: 2021-07-25 | Payer: MEDICARE

## 2021-07-29 ENCOUNTER — Encounter: Admit: 2021-07-29 | Discharge: 2021-07-29 | Payer: MEDICARE

## 2021-10-25 ENCOUNTER — Encounter: Admit: 2021-10-25 | Discharge: 2021-10-25 | Payer: MEDICARE

## 2021-10-28 ENCOUNTER — Encounter: Admit: 2021-10-28 | Discharge: 2021-10-28 | Payer: MEDICARE

## 2021-10-28 DIAGNOSIS — Z950 Presence of cardiac pacemaker: Secondary | ICD-10-CM

## 2021-11-01 ENCOUNTER — Encounter: Admit: 2021-11-01 | Discharge: 2021-11-01 | Payer: MEDICARE

## 2021-11-01 ENCOUNTER — Ambulatory Visit: Admit: 2021-11-01 | Discharge: 2021-11-02 | Payer: MEDICARE

## 2021-11-01 DIAGNOSIS — I10 Essential (primary) hypertension: Secondary | ICD-10-CM

## 2021-11-01 DIAGNOSIS — L570 Actinic keratosis: Secondary | ICD-10-CM

## 2021-11-01 DIAGNOSIS — K529 Noninfective gastroenteritis and colitis, unspecified: Secondary | ICD-10-CM

## 2021-11-01 DIAGNOSIS — K289 Gastrojejunal ulcer, unspecified as acute or chronic, without hemorrhage or perforation: Secondary | ICD-10-CM

## 2021-11-01 DIAGNOSIS — L814 Other melanin hyperpigmentation: Secondary | ICD-10-CM

## 2021-11-01 DIAGNOSIS — L853 Xerosis cutis: Secondary | ICD-10-CM

## 2021-11-01 DIAGNOSIS — B079 Viral wart, unspecified: Secondary | ICD-10-CM

## 2021-11-01 DIAGNOSIS — L219 Seborrheic dermatitis, unspecified: Secondary | ICD-10-CM

## 2021-11-01 DIAGNOSIS — L989 Disorder of the skin and subcutaneous tissue, unspecified: Secondary | ICD-10-CM

## 2021-11-01 DIAGNOSIS — E785 Hyperlipidemia, unspecified: Secondary | ICD-10-CM

## 2021-11-01 DIAGNOSIS — I2510 Atherosclerotic heart disease of native coronary artery without angina pectoris: Secondary | ICD-10-CM

## 2021-11-01 DIAGNOSIS — I219 Acute myocardial infarction, unspecified: Secondary | ICD-10-CM

## 2021-11-01 DIAGNOSIS — I200 Unstable angina: Secondary | ICD-10-CM

## 2021-11-01 DIAGNOSIS — D229 Melanocytic nevi, unspecified: Secondary | ICD-10-CM

## 2021-11-01 DIAGNOSIS — I110 Hypertensive heart disease with heart failure: Secondary | ICD-10-CM

## 2021-11-01 DIAGNOSIS — K130 Diseases of lips: Secondary | ICD-10-CM

## 2021-11-01 DIAGNOSIS — L821 Other seborrheic keratosis: Secondary | ICD-10-CM

## 2021-11-01 DIAGNOSIS — IMO0002 Degenerative disc disease: Secondary | ICD-10-CM

## 2021-11-01 DIAGNOSIS — R972 Elevated prostate specific antigen [PSA]: Secondary | ICD-10-CM

## 2021-11-01 DIAGNOSIS — D692 Other nonthrombocytopenic purpura: Secondary | ICD-10-CM

## 2021-11-01 DIAGNOSIS — I8390 Asymptomatic varicose veins of unspecified lower extremity: Secondary | ICD-10-CM

## 2021-11-01 DIAGNOSIS — M069 Rheumatoid arthritis, unspecified: Secondary | ICD-10-CM

## 2021-11-01 DIAGNOSIS — Z85828 Personal history of other malignant neoplasm of skin: Secondary | ICD-10-CM

## 2021-11-01 MED ORDER — KETOCONAZOLE 2 % TP SHAM
TOPICAL | 3 refills | 30.00000 days | Status: AC
Start: 2021-11-01 — End: ?

## 2021-11-01 NOTE — Progress Notes
ATTESTATION    I personally performed the key portions of the E/M visit, discussed case with resident and concur with resident documentation of history, physical exam, assessment, and treatment plan unless otherwise noted. The resident performed cryotherapy today: liquid nitrogen was applied for 10-12 seconds to the skin lesions and the expected blistering or scabbing reaction explained. Pt instructed not to pick at the area(s) and that hypopigmented scars may result from the procedure. Return if lesion fails to fully resolve. Patient tolerated procedure well, no complications.       Staff name:  Naliya Gish M Zykee Avakian, MD Date:  11/01/2021

## 2021-11-01 NOTE — Patient Instructions
Vitamin D  - We recommend Vitamin D supplementation after a fatty meal, especially if there is no contraindications such as kidney stones    Moles  --Common melanocytic nevi (moles) tend to be =6 mm in diameter and symmetric with even pigmentation, round or oval shape, regular outline, and sharp, non-fuzzy border.  --Dermal nevi stick out from the skin, but if they are soft they are usually not worrisome.  --Flaky seemingly stuck-on brown bumps are usually benign keratoses, not moles.  --Bright red smooth bumps that do not bleed are usually benign blood vessel lesions (cherry angiomas), not moles.  --Atypical nevi/clinical features of possible melanoma include asymmetry, border irregularities, color variability, and diameter >6 mm.  The earliest sign of melanoma is usually a rapidly growing mole.  --About half of melanoma arises in an existing mole and up to half on normal skin.  --It is normal to get new moles until the age of 7-45 years old.  --Multiple atypical nevi are a marker of increased risk of melanoma. The risk of melanoma depends also upon the total number of nevi, family and/or personal history of melanoma, and sun exposure history.   --It is important to look at your moles once a month.  It may help to follow them with photos such as on your smart phone.  Looking once a month you can notice rapid changes and call if these occur.  --Using sunscreens and sun avoidance will decrease your risk of developing melanoma.  The best sun protection is sun avoidance, including with clothing such as long sleeves and a broad brimmed hat.  The best sunscreens are SPF 30 or above cream based with zinc oxide.  One ounce (shot glass sized) amount is needed for an adult.  It should be reapplied every 2 hours if possible.  --Any tanning bed use will increase your risk of melanoma significantly.    Sun Protection   UPF/SPF rated clothing (gloves, long sleeves, scarves); broad-brimmed hats (NOT ball caps!)   [employer] additive can increase the SPF value of your everyday clothing   Cowboy hats protect from sun; baseball hats don't   Here are several recommended zinc-based sunscreen brands in aplphabetical order (always read the ingredient list, as many brands have multiple varieties of sunscreens and not all are zinc-based)   Cyndia Bent, [employer], [employer], Culp, [place], [employer], Green Screen,  [employer], Forensic scientist, SkinCeuticals, Vanicream   2 shot-glases = whole body    Melanoma Patient Information    Also called malignant melanoma     Skin cancer screening: If you notice a mole that differs from others or one that changes, bleeds, or itches, see a dermatologist.   Melanoma is a type of skin cancer. Anyone can get melanoma. When found early and treated, the cure rate is nearly 100%. Allowed to grow, melanoma can spread to other parts of the body. Melanoma can spread quickly. When melanoma spreads, it can be deadly.Dermatologists believe that the number of deaths from melanoma would be much lower if people:  Knew the warning signs of melanoma.   Learned how to examine their skin for signs of skin cancer.   Took the time to examine their skin.   It's important to take time to look at the moles on your skin because this is a good way to find melanoma early. When checking your skin, you should look for the ABCDEs of melanoma.     ABCDE's of melanoma:  When performing monthly  skin exams for your moles or new moles, remember the ABCDE's of melanoma:    A - Asymmetry. (Concerning if spot is not symmetric)  B - Border. (Irregular border or notched border are concerning)  C - Color. (Multiple colors or changes in color are concerning.)  D - Diameter. (Larger than 6mm, ie, a pencil eraser, is concerning.)  E - Evolution. (An evolving or changing spot is concerning. If new itch, tenderness, or bleeding develop, these are concerning changes too.  See further explanation below:    Melanoma: Signs and symptoms Anyone can get melanoma. It's important to take time to look at the moles on your skin because this is a good way to find melanoma early. When checking your skin, you should look for the ABCDEs of melanoma.     ABCDEs of melanoma     A = Asymmetry  One half is unlike the other half.       B = Border  An irregular, scalloped, or poorly defined border.       C = Color  Is varied from one area to another; has shades of tan, brown or black, or is sometimes white, red, or blue.       D = Diameter  Melanomas usually greater than 6mm (the size of a pencil eraser) when diagnosed, but they can be smaller.       E = Evolving  A mole or skin lesion that looks different from the rest or is changing in size, shape, or color.    !! If you see a mole or new spot on your skin that has any of the ABCDEs, immediately make an appointment to see a dermatologist.    Signs of melanoma  The most common early signs (what you see) of melanoma are:     Growing mole on your skin.   Unusual looking mole on your skin or a mole that does not look like any other mole on your skin (the ugly duckling).   Non-uniform mole (has an odd shape, uneven or uncertain border, different colors).     Symptoms of melanoma  In the early stages, melanoma may not cause any symptoms (what you feel). But sometimes melanoma will:    Itch.    Bleed.    Feel painful.   Many melanomas have these signs and symptoms, but not all. There are different types of melanoma. One type can first appear as a brown or black streak underneath a fingernail or toenail. Melanoma also can look like a bruise that just won't heal.     Who gets melanoma?  Anyone can get melanoma. Most people who get it have light skin, but people who have brown and black skin also get melanoma.   Some people have a higher risk of getting melanoma. These people have the following traits:   Skin    Fair skin (The risk is higher if the person also has red or blond hair and blue or green eyes). Sun-sensitive skin (rarely tans or burns easily).    50-plus moles, large moles, or unusual-looking moles.    If you have had bad sunburns or spent time tanning (sun, tanning beds, or sun lamps), you also have a higher risk of getting melanoma.   Men older than 50 are at a higher risk for developing skin cancers, including melanoma. Learning how to check your skin and getting skin exams can help detect skin cancer.    Family/medical history   Melanoma runs  in the family (parent, child, sibling, cousin, aunt, uncle had melanoma).   You had another skin cancer, but most especially another melanoma.   A weakened immune system.      Research shows that indoor tanning increases a person's melanoma risk by 75%. The risk also may increase if you had breast or thyroid cancer.    More people getting melanoma  Fewer people are getting most types of cancer. Melanoma is different. More people are getting melanoma. Many are white men who are 50 years or older. More young people also are getting melanoma. Melanoma is now the most common cancer among people 19-66 years old. Even teenagers are getting melanoma.    What causes melanoma?  Ultraviolet (UV) radiation is a major contributor in most cases. We get UV radiation from the sun, tanning beds, and sun lamps. Heredity also plays a role. Research shows that if a close blood relative (parent, child, sibling, aunt, uncle) had melanoma, a person has a much greater risk of getting melanoma.     How do dermatologists diagnose melanoma?  To diagnose melanoma, a dermatologist begins by looking at the patient's skin. A dermatologist will carefully examine moles and other suspicious spots. To get a better look, a dermatologist may use a device called a dermoscope.      Vitamin D    Our bodies need vitamin D to build strong and healthy bones. Vitamin D helps the body absorb the calcium that our bones require.     For a healthy person, the recommended daily dietary allowance is 600 international units for people of 56-6 years old, and 800 international units for people older than 71 years. More vitamin D is not better. Higher amounts of vitamin D could be harmful, leading to many health problems such as high blood pressure and kidney damage.     [employer] D[employer] recommending everyone get vitamin D from foods naturally rich in vitamin D, foods and beverages fortified with vitamin D or vitamin D supplements. The foods that contain the greatest amount are fatty fishes such as salmon, tuna and mackerel. Fish liver oil is another good source.     One of the sources to look up vitamin amount is through [employer]. [employer]. This can help you find out whether you get enough vitamin D from your diet. If you are like many people, you may not be getting your recommended dietary allowance of vitamin D. You may want to change the foods that you eat or take a vitamin D supplements. Before you start taking a vitamin D supplement, talk with your doctor.     Vitamin D is produced in the skin by UV light, but the amount is highly variable and depends on many factors. However, getting vitamin D from the sun or tanning beds can 1) increase your risk of developing skin cancer including melanoma which can be deadly, 2) resulting premature skin aging (wrinkles, age spots, blotchy complexion) and 3) leading to a weakened immune system. Therefore, Teacher, music of Dermatology recommend getting vitamin D safely from foods, beverages and supplements.

## 2021-11-01 NOTE — Progress Notes
Date of Service: 11/01/2021    Subjective:             EVEN POLIO is a 88 y.o. male.    History of Present Illness  Return pt. LV 05/08/2021   ?  #?H/o squamous cell carcinoma   - L superior helix?s/p Mohs 2015?  ?  #?Actinic Keratosis/ Actinic Chelitis  - LN2 and PDT in the past. ?  ?  #?Melanocytic nevi distributed over the head, trunk, arms and legs.  - H/o?blistering sunburns  ?  #?Seb derm  - previously prescribed keto shampoo, thought this was too drying   - currently using OTC shampoo with conditioner and baby oil  ?  # Dry lips  - pt regularly licks, bites lips  - asymptomatic    ?  Family Hx: no?h/o?skin cancer. 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  ?     Review of Systems   Constitutional: Negative for appetite change and unexpected weight change.   Gastrointestinal: Negative for diarrhea, nausea and vomiting.         Objective:         ? acetaminophen (TYLENOL) 500 mg tablet Take one tablet by mouth twice daily. Max of 4,000 mg of acetaminophen in 24 hours.   ? allopurinol (ZYLOPRIM) 100 mg tablet Take one tablet by mouth daily.   ? aspirin EC 81 mg tablet Take one tablet by mouth daily. Take with food.   ? atorvastatin (LIPITOR) 20 mg tablet TAKE 1 TABLET EVERY DAY   ? coenzyme Q10 100 mg cap Take 50 mg by mouth at bedtime daily.   ? desonide (DESOWEN) 0.05 % topical cream Apply twice a day as needed for irritation on the ear and lips. Use up to 50% of days   ? diclofenac sodium (VOLTAREN) 1 % topical gel Apply four g topically to affected area three times daily as needed.   ? FAMOTIDINE PO Take  by mouth as Needed.   ? gabapentin (NEURONTIN) 600 mg tablet Take one tablet by mouth twice daily.   ? hydroxychloroquine (PLAQUENIL) 200 mg PO tablet Take one tablet by mouth twice daily.   ? lactulose 10 gram/15 mL oral solution Take  by mouth as Needed.   ? loratadine (CLARITIN) 10 mg tablet Take one tablet by mouth every morning.   ? meclizine (ANTIVERT) 25 mg tablet Take one tablet by mouth daily.   ? metoclopramide HCL (REGLAN) 5 mg tablet Take one tablet by mouth as Needed for Nausea, Vomiting or Other....   ? metoprolol succinate XL (TOPROL XL) 25 mg extended release tablet TAKE 1 TABLET EVERY DAY   ? mirtazapine (REMERON) 15 mg tablet Take one tablet by mouth at bedtime daily.   ? nitroglycerin (NITROSTAT) 0.4 mg tablet DISSOLVE ONE TABLET UNDER THE TONGUE EVERY 5 MINUTES AS NEEDED FOR CHEST PAIN.  DO NOT EXCEED A TOTAL OF 3 DOSES IN 15 MINUTES   ? polyethylene glycol 3350 (MIRALAX PO) Take  by mouth.   ? primidone (MYSOLINE) 50 mg tablet Take four tablets by mouth at bedtime daily.   ? rivaroxaban (XARELTO) 20 mg tablet Take one tablet by mouth daily. Take with food.   ? sennosides/docusate sodium (SENNA PLUS PO) Take  by mouth twice daily.     Vitals:    11/01/21 1001   PainSc: Eight   Weight: 93.9 kg (207 lb)   Height: 185.4 cm (6' 1)  Body mass index is 27.31 kg/m?[name]     Physical Exam  ?  Physical Exam  Areas Examined (all normal unless noted below):  Head/Face  Neck  Chest  Back  Abdomen  R upper ext  L upper ext  R lower ext  L lower ext    Pertinent findings include:  Scar at site of prior SCC on L superior helix  Soft, pigmented, stuck-on-appearing papules are distributed over the examined areas.? All have symmetric pebbled dermoscopic findings.  Multiple brown and tan evenly pigmented macules are distributed over the examined areas.? All have symmetric similar dermascopic findings with primarily globular and reticular patterns.    erythematous scaly papule(s) are noted on the lower lip x1, nose x3, L ear x1.       Scaling of b/l upper and lower cutaneous lips    Moderate diffuse xerosis       Assessment and Plan:  #?History of squamous cell carcinoma in 2015?  -?No evidence of recurrence  ?  #?Actinic Keratosis  -Discussed diagnosis, etiology, and recommended treatment options  -Discussed risks and benefits of LN2 including expected blistering/scabbing with possibility of depigmentation and scarring.  -LN2 -f/t/f x 5 lesions  -Counseled to call for reevaluation if lesions do not resolve  -sunprotection advised  ?  #?Melanocytic nevi  - Will cont to monitor  - RTC for new/changing lesions  ?  #?Seborrheic keratoses  - reassurance  ?  #?Seb derm- well controlled  - Continue clobetasol solution daily as needed for itch.   - Start ketoconazole shampoo 2-3 times per week.   - continue OTC anti-dandruff shampoo  ?  # Cheilitis   - counseled on etiology related to chronic irritation  - encouraged to avoid biting, licking lips as much as possible  - continue frequent plain vaseline to lips  - Continue Rx desonide cream BID PRN  ?  # Xerosis  - recommend frequent bland emollient. Provided with recommendations in AVS for gentle skin care  ?  RTC?12 months  ?

## 2021-11-07 ENCOUNTER — Ambulatory Visit: Admit: 2021-11-07 | Discharge: 2021-11-07 | Payer: MEDICARE

## 2021-11-07 ENCOUNTER — Encounter: Admit: 2021-11-07 | Discharge: 2021-11-07 | Payer: MEDICARE

## 2021-11-07 DIAGNOSIS — Z950 Presence of cardiac pacemaker: Secondary | ICD-10-CM

## 2022-01-06 ENCOUNTER — Encounter: Admit: 2022-01-06 | Discharge: 2022-01-06 | Payer: MEDICARE

## 2022-01-06 DIAGNOSIS — I480 Paroxysmal atrial fibrillation: Secondary | ICD-10-CM

## 2022-01-06 DIAGNOSIS — Z950 Presence of cardiac pacemaker: Secondary | ICD-10-CM

## 2022-01-06 DIAGNOSIS — R001 Bradycardia, unspecified: Secondary | ICD-10-CM

## 2022-01-27 ENCOUNTER — Encounter: Admit: 2022-01-27 | Discharge: 2022-01-27 | Payer: MEDICARE

## 2022-02-03 ENCOUNTER — Encounter: Admit: 2022-02-03 | Discharge: 2022-02-03 | Payer: MEDICARE

## 2022-02-11 ENCOUNTER — Encounter: Admit: 2022-02-11 | Discharge: 2022-02-11 | Payer: MEDICARE

## 2022-02-11 DIAGNOSIS — K289 Gastrojejunal ulcer, unspecified as acute or chronic, without hemorrhage or perforation: Secondary | ICD-10-CM

## 2022-02-11 DIAGNOSIS — I2510 Atherosclerotic heart disease of native coronary artery without angina pectoris: Secondary | ICD-10-CM

## 2022-02-11 DIAGNOSIS — L814 Other melanin hyperpigmentation: Secondary | ICD-10-CM

## 2022-02-11 DIAGNOSIS — I200 Unstable angina: Secondary | ICD-10-CM

## 2022-02-11 DIAGNOSIS — D692 Other nonthrombocytopenic purpura: Secondary | ICD-10-CM

## 2022-02-11 DIAGNOSIS — I110 Hypertensive heart disease with heart failure: Secondary | ICD-10-CM

## 2022-02-11 DIAGNOSIS — IMO0002 Degenerative disc disease: Secondary | ICD-10-CM

## 2022-02-11 DIAGNOSIS — L570 Actinic keratosis: Secondary | ICD-10-CM

## 2022-02-11 DIAGNOSIS — B079 Viral wart, unspecified: Secondary | ICD-10-CM

## 2022-02-11 DIAGNOSIS — R0989 Other specified symptoms and signs involving the circulatory and respiratory systems: Secondary | ICD-10-CM

## 2022-02-11 DIAGNOSIS — M069 Rheumatoid arthritis, unspecified: Secondary | ICD-10-CM

## 2022-02-11 DIAGNOSIS — R972 Elevated prostate specific antigen [PSA]: Secondary | ICD-10-CM

## 2022-02-11 DIAGNOSIS — L853 Xerosis cutis: Secondary | ICD-10-CM

## 2022-02-11 DIAGNOSIS — I219 Acute myocardial infarction, unspecified: Secondary | ICD-10-CM

## 2022-02-11 DIAGNOSIS — I495 Sick sinus syndrome: Secondary | ICD-10-CM

## 2022-02-11 DIAGNOSIS — I10 Essential (primary) hypertension: Secondary | ICD-10-CM

## 2022-02-11 DIAGNOSIS — E782 Mixed hyperlipidemia: Secondary | ICD-10-CM

## 2022-02-11 DIAGNOSIS — I480 Paroxysmal atrial fibrillation: Secondary | ICD-10-CM

## 2022-02-11 DIAGNOSIS — E785 Hyperlipidemia, unspecified: Secondary | ICD-10-CM

## 2022-02-11 DIAGNOSIS — K529 Noninfective gastroenteritis and colitis, unspecified: Secondary | ICD-10-CM

## 2022-02-11 DIAGNOSIS — I8390 Asymptomatic varicose veins of unspecified lower extremity: Secondary | ICD-10-CM

## 2022-02-11 DIAGNOSIS — L989 Disorder of the skin and subcutaneous tissue, unspecified: Secondary | ICD-10-CM

## 2022-02-11 MED ORDER — APIXABAN 5 MG PO TAB
5 mg | ORAL_TABLET | Freq: Two times a day (BID) | ORAL | 3 refills | Status: AC
Start: 2022-02-11 — End: ?

## 2022-02-11 NOTE — Patient Instructions
Thank you for visiting our office today.    We would like to make the following medication adjustments:      Eliquis '5mg'$  twice daily  Stop Aspirin       Otherwise continue the same medications as you have been doing.          We will be pursuing the following tests after your appointment today:       Orders Placed This Encounter    LIPID PROFILE    ECG 12-LEAD     **FASTING labs**    We will plan to see you back in 6 months.  Please call us in the meantime with any questions or concerns.        Please allow 5-7 business days for our providers to review your results. All normal results will go to MyChart. If you do not have Mychart, it is strongly recommended to get this so you can easily view all your results. If you do not have mychart, we will attempt to call you once with normal lab and testing results. If we cannot reach you by phone with normal results, we will send you a letter.  If you have not heard the results of your testing after one week please give [place] a call.       Your [facility] Team Richardson Landry, Rene Kocher, Threasa Beards, and Lester)  phone number is [phone].

## 2022-02-11 NOTE — Progress Notes
Date of Service: 02/11/2022    Kenneth Robles is a 89 y.o. male.       HPI   Kenneth Robles is followed for coronary artery disease and paroxysmal atrial fibrillation. .The patient reports that he is working with his urologist for continued treatment of prostate cancer.  He indicates that he has received radiation therapy previously.  He reports no recent falls..  Otherwise, over the past 6 months Kenneth Robles has felt well and reports no angina, congestive symptoms, palpitations, sensation of sustained forceful heart pounding, or syncope.  I do not believe that he is aware when he has paroxysmal atrial fibrillation.  His exercise tolerance has been stable.  [facility] and walks probably 1/6 of a mile at [facility] on Tuesdays and Thursdays.  The patient reports no myalgias, bleeding abnormalities, or strokelike symptoms.  He continues to have chronic back,hip and knee pain for which he sees a rheumatologist and orthopedic surgeon.  The patient also reports that he has a pain control specialist. 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  His CHA2DS2-Vasc score is 3 and he currently states that he takes apixaban 5 mg twice daily.. In August 2017, his atorvastatin dose was decreased to 20 mg daily because of myalgias, and he is tolerating this dose without adverse effects. Kenneth Robles reported orthostasis in late August 2018 and his Imdur was stopped.  This has improved his orthostasis considerably.His Flomax was stopped for similar reasons.      Kenneth Robles past medical history is noteworthy for permanent pacemaker placement on 01/22/06. On 09/14/06 he underwent atrial fibrillation ablation complicated by perciardial effusion requiring pericardiocentesis. He underwent successful PCI of the mid and proximal LAD with 2 overlapping Xience drug-eluting stents, a 2.5 x 28 and a 3.0 x 15 Xience drug-eluting stent from distal to proximal on 05/23/09. Coronary angiography performed on 01/07/13 revealed    Moderate ostial LAD stenosis 40 percent to 50 percent, with a haziness in the mid LAD segment. The proximal LAD was widely patent and the FFR of the LAD was 0.84 at maximal hyperemia.  Chronic total occlusion of a high obtuse marginal branch with left-to-left collaterals, which was unchanged from the prior study dated 2011.   Normal left ventricular end-diastolic pressures.  Kenneth Robles was seen on 06/08/14 after his pacemaker had reached elective replacement interval and had defaulted to VVI pacing. At that time he had developed dyspnea with exertion. He underwent pacemaker generator replacement on 06/12/14. He tripped over a tool in his garage on 03/10/15 resulting in multiple ecchymosis to his face. He indicates that he was evaluated and that there were no serious injuries. Kenneth Robles saw Dr. Wallene Huh on 08/13/2015 because R-wave sensing on the right ventricular lead of his pacemaker had decreased.  No action was required. The patient was hospitalized locally on September 03, 2016 for right lower lobe infiltrate/pneumonia was treated initially with Zosyn and then with Augmentin and albuterol inhaler.  Apparently he had paroxysmal atrial fibrillation at the same time which resolved spontaneously. The patient was hospitalized on December 09, 2016 for chest discomfort.  There was no evidence for an acute coronary syndrome.  Coronary angiography was performed which showed mild to moderate nonobstructive coronary disease without high-grade stenosis.  No coronary intervention was required.  On November 02, 2017 while at the pool, he he stood up too quickly and  became momentarily lightheaded and fell striking his head. A CT scan of the spine and head work was obtained but showed no major bleeding.  On 05/12/2020 he developed small bowel obstruction.  He underwent exploratory laparotomy that day at [facility] hospital was found to have for ischemic enteritis.  He underwent repeat exploratory laparotomy on 05/14/2020.        Vitals:    02/11/22 0951   BP: 114/62   BP Source: Arm, Left Upper   Pulse: 90   SpO2: 96%   O2 Device: None (Room air)   PainSc: Zero   Weight: 96.2 kg (212 lb)   Height: 185.4 cm (6' 1)     Body mass index is 27.97 kg/m?[name]     Past Medical History  Patient Active Problem List    Diagnosis Date Noted    Hypertensive heart disease with heart failure (HCC) 02/21/2021    Postoperative follow-up 07/13/2020    Acute blood loss anemia 05/15/2020    Overweight (BMI 25.0-29.9) 05/15/2020    Acute postoperative pain of abdomen 05/15/2020    Small bowel obstruction (HCC) 05/14/2020    Rheumatoid arthritis(714.0) 03/09/2014    Left-sided low back pain without sciatica 03/02/2014    Lumbar radiculopathy 03/02/2014    Spondylosis of lumbar region without myelopathy or radiculopathy 03/02/2014    Unspecified hyperplasia of prostate with urinary obstruction and other lower urinary tract symptoms (LUTS) 09/02/2011    Pigmented skin lesions - bilateral ear 11/25/2009     Solar lentigo with actinic purpura        CAD (coronary artery disease) 07/05/2009     Admitted with unstable angina. Cardiac cath on 05/23/2009 with successful PCI with overlapping Xience drug-eluting stents, a 2.5 x 28 and a 2.5 x 15 from distal to proximal, postdilated with a 2.75 and subsequently a 3.0 balloon.    08/29/2015 - Stress Test:  This study is mildly abnormal, indicative of a small mild intensity area of reversible ischemia involving the distal inferior wall.  The appearance is very similar to what was seen in 2012. The left ventricular systolic function is normal. There are no high risk prognostic indicators present.        Osteoarthritis 05/21/2009    PAF (paroxysmal atrial fibrillation) (HCC) 09/14/2006     Paroxysmal symptomatic atrial fibrillation controlled with Tikosyn. Previous left atrial ablation attempt in August 2008 with Dr. Kathleene Hazel. Unfortunately, it was complicated by cardiac tamponade, but the patient did well subsequently. He was placed on Tikosyn which seems to control his arrhythmia very well.     04/2010. Mr Maroun declined restarting Tikosyn because atrial fibrillation wasn't frequent and it was very expensive. Target INR is 2.5.      Hyperlipidemia 09/14/2006     Mr. Cornelia has had problems with statin myalgias and side effects from niacin. He declines any future lipid lower therapies.      Cardiac pacemaker in situ 09/14/2006    Sinus bradycardia 01/22/2006    HTN (hypertension) 01/22/2006    CRI (chronic renal insufficiency) 01/22/2006         Review of Systems   Constitutional: Negative.   HENT: Negative.     Eyes: Negative.    Cardiovascular: Negative.    Respiratory: Negative.     Endocrine: Negative.    Hematologic/Lymphatic: Negative.    Skin: Negative.    Musculoskeletal: Negative.    Gastrointestinal: Negative.    Genitourinary: Negative.    Neurological: Negative.    Psychiatric/Behavioral: Negative.  Allergic/Immunologic: Negative.        Physical Exam  GENERAL: The patient is well developed, well nourished, resting comfortably and in no distress.    HEENT: No abnormalities of the visible oro-nasopharynx, conjunctiva or sclera are noted.  Wearing hearing aids.  NECK: There is no jugular venous distension. Carotids are palpable and without bruits. There is no thyroid enlargement.  Chest: Lung fields are clear to auscultation. There are no wheezes or crackles. His pacemaker incision in left infraclavicular area looks good without erosion or cellulitis.  CV: There is a regular rhythm. The first and second heart sounds are normal. There are no murmurs, gallops or rubs. His apical heart rate is 80 BPM.  ABD: The abdomen is soft and supple with normal bowel sounds. There is no hepatosplenomegaly, ascites, tenderness, masses or bruits.  His abdominal incision is healed nicely.  Neuro: There are no focal motor defects. Ambulation is normal. Cognitive function appears normal.  Ext: There is no edema or evidence of deep vein thrombosis. Good radial pulses. Good posterior tibial pulses. Decreased right dorsalis pulse but good capillary refill and no ischemic ulcers.  SKIN: No cellulitis or skin rashes noted.  PSYCH: The patient is calm, rationale and oriented.    Cardiovascular Studies  A twelve-lead ECG was obtained on 02/11/2022 reveals atrial pacing with a heart rate of 83 bpm.  Right bundle branch block and left anterior fascicular block are noted.  Echo Doppler 05/02/2021:  Interpretation Summary  The left ventricular systolic function is normal. The visually estimated ejection fraction is 55%.  Mild concentric hypertrophy.  Abnormal septal motion consistent with right ventricular pacing.  Grade I (mild) left ventricular diastolic dysfunction. Normal left atrial pressure.  The right ventricle is mildly dilated with preserved systolic function. M-Mode TAPSE 1.9 cm (normal >1.7 cm).  Trace mitral valve regurgitation, mild tricuspid valve regurgitation.  Normal valve structure, trileaflet, no stenosis, trace regurgitation.  Mild pulmonary hypertension, estimated PAP = 47 mmHg.  Compared to the previous study dated 05/12/2020: LVEF 65%, no significant valvular abnormalities, estimated PAP 31 mmHg + RAP (on the previous study IVC was not well visualized).     Regadenoson time stress test 05/02/2021:  Scintigraphic Findings:  Planar images reveal the left ventricular cavity is normal in size.  There is normal pulmonary tracer uptake.  There is diaphragmatic attenuation noted.  There is extracardiac uptake from the small bowel near the inferior wall. No transient ischemic dilation is present.      Tomographic images were reconstructed in three orthogonal views.  There is a small size, mild intensity defect involving the inferior wall.  The defect is fixed with reverse redistribution.  The wall motion is preserved.  This probably represents diaphragmatic attenuation.  A definite ischemic change is not appreciated.     Polar coordinate map identifies no perfusion abnormalities.     TID Ratio:  0.96  (normal <1.36).      Summed Stress Score:  0   , Summed Rest Score:  0     Regional Wall Thickening and Motion Post Stress:   There is normal left ventricular wall motion and thickening of all myocardial segments.Left Ventricular Ejection Fraction (post stress, in the resting state) =  69 %.   Left Ventricular End Diastolic Volume: 84 mL  SUMMARY/OPINION:  This study is probably normal.  The perfusion pattern is most consistent with diaphragmatic attenuation, a definite ischemic change is not appreciated.  Left ventricular systolic function is normal, the ejection fraction  69%. There are no high risk prognostic indicators present.  The pulmonary to myocardial count ratio is normal at 0.29, there is no transit ischemic dilatation.  The pharmacologic ECG portion of the study is negative for ischemia. The prior study was obtained on 08/29/2015.  It demonstrated an EF of 71%, the end-diastolic volume was 0.43, there is no transit ischemic dilatation.  It demonstrated a mixed defect involving the inferior wall suggestive of possible ischemia. Comparing the two studies qualitatively, the prior study demonstrate reversibility involving the inferior wall, the current study is fixed with reverse redistribution.  The current study is more suggestive of soft tissue attenuation. In aggregate the current study is low risk in regards to predicted annual cardiovascular mortality rate.     Remote pacemaker interrogation 01/27/2022:  Device Check Results               Remote Transmission Date Range: 2022-01-23 - 2022-01-23   Last In-Office or Inpatient Check: 11/07/2021      Device Type:  IPG   Advisory:     Lead Placement: Right Atrium - Connected  Right Ventricle - Connected      Mode: AAIR<=>DDDR   Battery Longevity: 31.0 months  (2.58 years)      Pacemaker Dependent:  yes Pacing Percentages: A Pacing: 99.97 %  RV Pacing: 0.06 %             Interpretation Summary    Title: Normal Remote: No Events    * Normal Device Function  * Alerts or events: None  * Battery: OK, 2.58 yrs   * Sensing, impedance and thresholds reviewed  * Programmed parameters reviewed  * Presenting rhythm reviewed, APVS 70s  * Heart Rate Histograms reviewed, Some HR response            Encounter Summary: This report includes 1 transmission that was received on 2022-01-23. Battery, lead impedance, sensing amplitude and pacing threshold data was reviewed.       Cardiovascular Health Factors  Vitals BP Readings from Last 3 Encounters:   02/11/22 114/62   05/02/21 138/72   02/21/21 112/62     Wt Readings from Last 3 Encounters:   02/11/22 96.2 kg (212 lb)   11/01/21 93.9 kg (207 lb)   05/08/21 93.9 kg (207 lb)     BMI Readings from Last 3 Encounters:   02/11/22 27.97 kg/m?   11/01/21 27.31 kg/m?   05/08/21 27.31 kg/m?      Smoking Social History     Tobacco Use   Smoking Status Never   Smokeless Tobacco Never      Lipid Profile Cholesterol   Date Value Ref Range Status   09/10/2018 117  Final     HDL   Date Value Ref Range Status   09/10/2018 44  Final     LDL   Date Value Ref Range Status   09/10/2018 56  Final     Triglycerides   Date Value Ref Range Status   05/28/2020 101 <150 MG/DL Final      Blood Sugar No results found for: HGBA1C  Glucose   Date Value Ref Range Status   12/25/2021 109 (H) 70 - 105 Final   02/21/2021 86  Final   05/30/2020 100 70 - 100 MG/DL Final     Glucose, POC   Date Value Ref Range Status   05/30/2020 130 (H) 70 - 100 MG/DL Final   45/40/9811 914 (H) 70 - 100 MG/DL Final   78/29/5621  133 (H) 70 - 100 MG/DL Final          Problems Addressed Today  Encounter Diagnoses   Name Primary?    Cardiovascular symptoms Yes       Assessment and Plan   Mr. Fabbri appears stable from a cardiovascular perspective and reports no angina or congestive symptoms.  His blood pressure is well-controlled. I have asked him to obtain a fasting lipid profile.  Since he is taking therapeutic apixaban he has the option of stopping his aspirin.  He was so informed.  He is quite functional for his age and does water exercises and walks.  I asked him to return for follow-up in 6 months time. The total time spent during this interview and exam with preparation and chart review was 30 minutes.         Current Medications (including today's revisions)   acetaminophen (TYLENOL) 500 mg tablet Take one tablet by mouth twice daily. Max of 4,000 mg of acetaminophen in 24 hours.    allopurinol (ZYLOPRIM) 100 mg tablet Take one tablet by mouth daily.    aspirin EC 81 mg tablet Take one tablet by mouth daily. Take with food.    atorvastatin (LIPITOR) 20 mg tablet TAKE 1 TABLET EVERY DAY    coenzyme Q10 100 mg cap Take 50 mg by mouth at bedtime daily.    desonide (DESOWEN) 0.05 % topical cream Apply twice a day as needed for irritation on the ear and lips. Use up to 50% of days    diclofenac sodium (VOLTAREN) 1 % topical gel Apply four g topically to affected area three times daily as needed.    duloxetine DR (CYMBALTA) 30 mg capsule Take one capsule by mouth daily.    ELIQUIS 2.5 mg tablet Take one tablet by mouth twice daily.    FAMOTIDINE PO Take  by mouth as Needed.    gabapentin (NEURONTIN) 600 mg tablet Take one tablet by mouth twice daily.    GLUCOSAMINE SULFATE PO Take 500 mg by mouth three times daily with meals.    hydroxychloroquine (PLAQUENIL) 200 mg PO tablet Take one tablet by mouth twice daily.    ketoconazole (NIZORAL) 2 % topical shampoo Apply  topically to affected area three times weekly. Apply topically to affected area of damp skin, lather, leave on 5 minutes, and rinse. Repeat 2-3 time per week.    lactulose 10 gram/15 mL oral solution Take  by mouth as Needed.    loratadine (CLARITIN) 10 mg tablet Take one tablet by mouth every morning.    meclizine (ANTIVERT) 25 mg tablet Take one tablet by mouth daily. metoclopramide HCL (REGLAN) 5 mg tablet Take one tablet by mouth as Needed for Nausea, Vomiting or Other....    metoprolol succinate XL (TOPROL XL) 25 mg extended release tablet TAKE 1 TABLET EVERY DAY    mirabegron (MYRBETRIQ) 25 mg ER tablet Take one tablet by mouth daily.    mirtazapine (REMERON) 15 mg tablet Take one tablet by mouth at bedtime daily.    nitroglycerin (NITROSTAT) 0.4 mg tablet DISSOLVE ONE TABLET UNDER THE TONGUE EVERY 5 MINUTES AS NEEDED FOR CHEST PAIN.  DO NOT EXCEED A TOTAL OF 3 DOSES IN 15 MINUTES    polyethylene glycol 3350 (MIRALAX PO) Take  by mouth.    primidone (MYSOLINE) 50 mg tablet Take four tablets by mouth at bedtime daily.    rivaroxaban (XARELTO) 20 mg tablet Take one tablet by mouth daily. Take with food.  sennosides/docusate sodium (SENNA PLUS PO) Take  by mouth twice daily.

## 2022-02-13 ENCOUNTER — Encounter: Admit: 2022-02-13 | Discharge: 2022-02-13 | Payer: MEDICARE

## 2022-03-19 ENCOUNTER — Encounter: Admit: 2022-03-19 | Discharge: 2022-03-19 | Payer: MEDICARE

## 2022-03-19 NOTE — Progress Notes
Medication Assistance Program packet has been sent to patient for signatures and other required documents (if applicable) . Once received back, medication assistance coordinator will begin processing for Eliquis.    Allayne Stack  Medication Assistance Coordinator  [phone]

## 2022-03-24 ENCOUNTER — Encounter: Admit: 2022-03-24 | Discharge: 2022-03-24 | Payer: MEDICARE

## 2022-03-24 NOTE — Telephone Encounter
Patient called with concerns of shortness of breath and elevated pricing of eliquis. Patient reports his shortness of breath is not worse but not improved since his last OV in January 2024. Patient denies other symptoms including chest pain.  He states at that OV his eliquis was increased from 2.5 twice daily to 5 mg twice daily. He would like to know if he can go back to the 2.5 twice daily or switch to coumadin or maybe xarelto depending on pricing.     Will route to Dr. Jacklyn Shell for his review and recommendations.

## 2022-04-02 ENCOUNTER — Encounter: Admit: 2022-04-02 | Discharge: 2022-04-02 | Payer: MEDICARE

## 2022-04-02 NOTE — Telephone Encounter
Kenneth Robles called into the nurse line requesting an appointment with Dr. Jacklyn Shell.  He states that the Eliquis is $300 more a month then he had been paying.  He has also checked the price of Xarelto and was told it would be about $600/month.  He does not want to spend all of his money on the medication.  He is okay with going back to warfarin if that is the only option, but is not really wanting to do that either.  [name] has received a medication assistance packet from our retail med assist team which he is filling out.  He would like to see Dr. Jacklyn Shell to discuss his medications and needs.  Scheduled pt for follow up with [facility].  Pt confirmed appt time and location.  He still has 3 weeks of the medication available and will continue to take as prescribed.

## 2022-04-14 ENCOUNTER — Encounter: Admit: 2022-04-14 | Discharge: 2022-04-14 | Payer: MEDICARE

## 2022-04-14 MED ORDER — METOPROLOL SUCCINATE 25 MG PO TB24
ORAL_TABLET | ORAL | 3 refills | 90.00000 days | Status: AC
Start: 2022-04-14 — End: ?

## 2022-04-17 ENCOUNTER — Encounter: Admit: 2022-04-17 | Discharge: 2022-04-17 | Payer: MEDICARE

## 2022-04-17 DIAGNOSIS — E782 Mixed hyperlipidemia: Secondary | ICD-10-CM

## 2022-04-17 MED ORDER — ATORVASTATIN 20 MG PO TAB
ORAL_TABLET | 3 refills | Status: AC
Start: 2022-04-17 — End: ?

## 2022-04-21 ENCOUNTER — Encounter: Admit: 2022-04-21 | Discharge: 2022-04-21 | Payer: MEDICARE

## 2022-04-22 ENCOUNTER — Encounter: Admit: 2022-04-22 | Discharge: 2022-04-22 | Payer: MEDICARE

## 2022-04-22 DIAGNOSIS — I2510 Atherosclerotic heart disease of native coronary artery without angina pectoris: Secondary | ICD-10-CM

## 2022-04-22 DIAGNOSIS — L989 Disorder of the skin and subcutaneous tissue, unspecified: Secondary | ICD-10-CM

## 2022-04-22 DIAGNOSIS — E785 Hyperlipidemia, unspecified: Secondary | ICD-10-CM

## 2022-04-22 DIAGNOSIS — R972 Elevated prostate specific antigen [PSA]: Secondary | ICD-10-CM

## 2022-04-22 DIAGNOSIS — L853 Xerosis cutis: Secondary | ICD-10-CM

## 2022-04-22 DIAGNOSIS — I10 Essential (primary) hypertension: Secondary | ICD-10-CM

## 2022-04-22 DIAGNOSIS — I219 Acute myocardial infarction, unspecified: Secondary | ICD-10-CM

## 2022-04-22 DIAGNOSIS — L570 Actinic keratosis: Secondary | ICD-10-CM

## 2022-04-22 DIAGNOSIS — I200 Unstable angina: Secondary | ICD-10-CM

## 2022-04-22 DIAGNOSIS — D692 Other nonthrombocytopenic purpura: Secondary | ICD-10-CM

## 2022-04-22 DIAGNOSIS — R0989 Other specified symptoms and signs involving the circulatory and respiratory systems: Secondary | ICD-10-CM

## 2022-04-22 DIAGNOSIS — K289 Gastrojejunal ulcer, unspecified as acute or chronic, without hemorrhage or perforation: Secondary | ICD-10-CM

## 2022-04-22 DIAGNOSIS — B079 Viral wart, unspecified: Secondary | ICD-10-CM

## 2022-04-22 DIAGNOSIS — IMO0002 Squamous cell carcinoma: Secondary | ICD-10-CM

## 2022-04-22 DIAGNOSIS — I110 Hypertensive heart disease with heart failure: Secondary | ICD-10-CM

## 2022-04-22 DIAGNOSIS — L814 Other melanin hyperpigmentation: Secondary | ICD-10-CM

## 2022-04-22 DIAGNOSIS — M069 Rheumatoid arthritis, unspecified: Secondary | ICD-10-CM

## 2022-04-22 DIAGNOSIS — K529 Noninfective gastroenteritis and colitis, unspecified: Secondary | ICD-10-CM

## 2022-04-22 DIAGNOSIS — I8390 Asymptomatic varicose veins of unspecified lower extremity: Secondary | ICD-10-CM

## 2022-04-22 MED ORDER — FUROSEMIDE 20 MG PO TAB
20 mg | ORAL_TABLET | Freq: Every morning | ORAL | 1 refills | 90.00000 days | Status: AC
Start: 2022-04-22 — End: ?

## 2022-04-22 MED ORDER — POTASSIUM CHLORIDE 10 MEQ PO TBTQ
10 meq | ORAL_TABLET | Freq: Every day | ORAL | 3 refills | 30.00000 days | Status: AC
Start: 2022-04-22 — End: ?

## 2022-04-22 NOTE — Progress Notes
Date of Service: 04/22/2022    Kenneth Robles is a 89 y.o. male.       HPI   Kenneth Robles is followed for coronary artery disease and paroxysmal atrial fibrillation.  He reports infrequent dyspnea that is likely due to deconditioning.  Diastolic dysfunction could contribute.  He reports no recent falls.  His biggest issue in recent months has been trying to afford his Eliquis.  Otherwise, over the past 6 months Kenneth Robles has felt well and reports no angina, congestive symptoms, palpitations, sensation of sustained forceful heart pounding, or syncope.  I do not believe that he is aware when he has paroxysmal atrial fibrillation.  His exercise tolerance has been stable.  [facility] and walks probably 1/6 of a mile at [facility] on Tuesdays and Thursdays.  The patient reports no myalgias, bleeding abnormalities, or strokelike symptoms.  He continues to have chronic back,hip and knee pain for which he sees a rheumatologist and orthopedic surgeon.  The patient also reports that he has a pain control specialist. 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  His CHA2DS2-Vasc score is 3 and he currently states that he takes apixaban 5 mg twice daily.. In August 2017, his atorvastatin dose was decreased to 20 mg daily because of myalgias, and he is tolerating this dose without adverse effects. [name] reported orthostasis in late August 2018 and his Imdur was stopped.  This has improved his orthostasis considerably. His Flomax was stopped for similar reasons.      Kenneth Robles past medical history is noteworthy for permanent pacemaker placement on 01/22/06. On 09/14/06 he underwent atrial fibrillation ablation complicated by perciardial effusion requiring pericardiocentesis. He underwent successful PCI of the mid and proximal LAD with 2 overlapping Xience drug-eluting stents, a 2.5 x 28 and a 3.0 x 15 Xience drug-eluting stent from distal to proximal on 05/23/09. Coronary angiography performed on 01/07/13 revealed    Moderate ostial LAD stenosis 40 percent to 50 percent, with a haziness in the mid LAD segment. The proximal LAD was widely patent and the FFR of the LAD was 0.84 at maximal hyperemia.  Chronic total occlusion of a high obtuse marginal branch with left-to-left collaterals, which was unchanged from the prior study dated 2011.   Normal left ventricular end-diastolic pressures.  Kenneth Robles was seen on 06/08/14 after his pacemaker had reached elective replacement interval and had defaulted to VVI pacing. At that time he had developed dyspnea with exertion. He underwent pacemaker generator replacement on 06/12/14. He tripped over a tool in his garage on 03/10/15 resulting in multiple ecchymosis to his face. He indicates that he was evaluated and that there were no serious injuries. Kenneth Robles saw Dr. Wallene Huh on 08/13/2015 because R-wave sensing on the right ventricular lead of his pacemaker had decreased.  No action was required. The patient was hospitalized locally on September 03, 2016 for right lower lobe infiltrate/pneumonia was treated initially with Zosyn and then with Augmentin and albuterol inhaler.  Apparently he had paroxysmal atrial fibrillation at the same time which resolved spontaneously. The patient was hospitalized on December 09, 2016 for chest discomfort.  There was no evidence for an acute coronary syndrome.  Coronary angiography was performed which showed mild to moderate nonobstructive coronary disease without high-grade stenosis.  No coronary intervention was required.  On November 02, 2017 while at the pool, he he  stood up too quickly and became momentarily lightheaded and fell striking his head. A CT scan of the spine and head work was obtained but showed no major bleeding.  On 05/12/2020 he developed small bowel obstruction.  He underwent exploratory laparotomy that day at [facility] hospital was found to have for ischemic enteritis.  He underwent repeat exploratory laparotomy on 05/14/2020.        Vitals:    04/22/22 1316   BP: (!) 145/83   BP Source: Arm, Left Upper   Pulse: 88   SpO2: 97%   O2 Device: None (Room air)   PainSc: Zero   Weight: 92.4 kg (203 lb 12.8 oz)   Height: 185.4 cm (6' 1)     Body mass index is 26.89 kg/m?Kenneth Robles     Past Medical History  Patient Active Problem List    Diagnosis Date Noted    Hypertensive heart disease with heart failure (HCC) 02/21/2021    Postoperative follow-up 07/13/2020    Acute blood loss anemia 05/15/2020    Overweight (BMI 25.0-29.9) 05/15/2020    Acute postoperative pain of abdomen 05/15/2020    Small bowel obstruction (HCC) 05/14/2020    Rheumatoid arthritis(714.0) 03/09/2014    Left-sided low back pain without sciatica 03/02/2014    Lumbar radiculopathy 03/02/2014    Spondylosis of lumbar region without myelopathy or radiculopathy 03/02/2014    Unspecified hyperplasia of prostate with urinary obstruction and other lower urinary tract symptoms (LUTS) 09/02/2011    Pigmented skin lesions - bilateral ear 11/25/2009     Solar lentigo with actinic purpura        CAD (coronary artery disease) 07/05/2009     Admitted with unstable angina. Cardiac cath on 05/23/2009 with successful PCI with overlapping Xience drug-eluting stents, a 2.5 x 28 and a 2.5 x 15 from distal to proximal, postdilated with a 2.75 and subsequently a 3.0 balloon.    08/29/2015 - Stress Test:  This study is mildly abnormal, indicative of a small mild intensity area of reversible ischemia involving the distal inferior wall.  The appearance is very similar to what was seen in 2012. The left ventricular systolic function is normal. There are no high risk prognostic indicators present.        Osteoarthritis 05/21/2009    PAF (paroxysmal atrial fibrillation) (HCC) 09/14/2006     Paroxysmal symptomatic atrial fibrillation controlled with Tikosyn. Previous left atrial ablation attempt in August 2008 with Dr. Kathleene Hazel. Unfortunately, it was complicated by cardiac tamponade, but the patient did well subsequently. He was placed on Tikosyn which seems to control his arrhythmia very well.     04/2010. Mr Teutsch declined restarting Tikosyn because atrial fibrillation wasn't frequent and it was very expensive. Target INR is 2.5.      Hyperlipidemia 09/14/2006     Mr. Rosener has had problems with statin myalgias and side effects from niacin. He declines any future lipid lower therapies.      Cardiac pacemaker in situ 09/14/2006    Sinus bradycardia 01/22/2006    HTN (hypertension) 01/22/2006    CRI (chronic renal insufficiency) 01/22/2006         Review of Systems   Constitutional: Negative.   HENT: Negative.     Eyes: Negative.    Cardiovascular: Negative.    Respiratory: Negative.     Endocrine: Negative.    Hematologic/Lymphatic: Negative.    Skin: Negative.    Musculoskeletal: Negative.    Gastrointestinal: Negative.    Genitourinary: Negative.    Neurological:  Negative.    Psychiatric/Behavioral: Negative.     Allergic/Immunologic: Negative.        Physical Exam  GENERAL: The patient is well developed, well nourished, resting comfortably and in no distress.    HEENT: No abnormalities of the visible oro-nasopharynx, conjunctiva or sclera are noted.  Wearing hearing aids.  NECK: There is no jugular venous distension. Carotids are palpable and without bruits. There is no thyroid enlargement.  Chest: Lung fields are clear to auscultation. There are no wheezes or crackles. His pacemaker incision in left infraclavicular area looks good without erosion or cellulitis.  CV: There is a regular rhythm. The first and second heart sounds are normal. There are no murmurs, gallops or rubs. His apical heart rate is 80 BPM.  ABD: The abdomen is soft and supple with normal bowel sounds. There is no hepatosplenomegaly, ascites, tenderness, masses or bruits.  His abdominal incision is healed nicely.  Neuro: There are no focal motor defects. Ambulation is normal. Cognitive function appears normal.  Ext: There is no edema or evidence of deep vein thrombosis. Good radial pulses. Good posterior tibial pulses. Decreased right dorsalis pulse but good capillary refill and no ischemic ulcers.  SKIN: No cellulitis or skin rashes noted.  PSYCH: The patient is calm, rationale and oriented.    Cardiovascular Studies  A twelve-lead ECG was obtained on 04/22/2022 reveals an atrial paced rhythm with an AV interval of 264 ms.  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    Echo Doppler 05/02/2021:  Interpretation Summary  The left ventricular systolic function is normal. The visually estimated ejection fraction is 55%.  Mild concentric hypertrophy.  Abnormal septal motion consistent with right ventricular pacing.  Grade I (mild) left ventricular diastolic dysfunction. Normal left atrial pressure.  The right ventricle is mildly dilated with preserved systolic function. M-Mode TAPSE 1.9 cm (normal >1.7 cm).  Trace mitral valve regurgitation, mild tricuspid valve regurgitation.  Normal valve structure, trileaflet, no stenosis, trace regurgitation.  Mild pulmonary hypertension, estimated PAP = 47 mmHg.  Compared to the previous study dated 05/12/2020: LVEF 65%, no significant valvular abnormalities, estimated PAP 31 mmHg + RAP (on the previous study IVC was not well visualized).      Regadenoson time stress test 05/02/2021:  Scintigraphic Findings:  Planar images reveal the left ventricular cavity is normal in size.  There is normal pulmonary tracer uptake.  There is diaphragmatic attenuation noted.  There is extracardiac uptake from the small bowel near the inferior wall. No transient ischemic dilation is present.      Tomographic images were reconstructed in three orthogonal views.  There is a small size, mild intensity defect involving the inferior wall.  The defect is fixed with reverse redistribution.  The wall motion is preserved.  This probably represents diaphragmatic attenuation.  A definite ischemic change is not appreciated.     Polar coordinate map identifies no perfusion abnormalities.     TID Ratio:  0.96  (normal <1.36).      Summed Stress Score:  0   , Summed Rest Score:  0     Regional Wall Thickening and Motion Post Stress:   There is normal left ventricular wall motion and thickening of all myocardial segments.Left Ventricular Ejection Fraction (post stress, in the resting state) =  69 %.   Left Ventricular End Diastolic Volume: 84 mL  SUMMARY/OPINION:  This study is probably normal.  The perfusion pattern is most consistent with diaphragmatic attenuation, a definite  ischemic change is not appreciated.  Left ventricular systolic function is normal, the ejection fraction 69%. There are no high risk prognostic indicators present.  The pulmonary to myocardial count ratio is normal at 0.29, there is no transit ischemic dilatation.  The pharmacologic ECG portion of the study is negative for ischemia. The prior study was obtained on 08/29/2015.  It demonstrated an EF of 71%, the end-diastolic volume was 0.43, there is no transit ischemic dilatation.  It demonstrated a mixed defect involving the inferior wall suggestive of possible ischemia. Comparing the two studies qualitatively, the prior study demonstrate reversibility involving the inferior wall, the current study is fixed with reverse redistribution.  The current study is more suggestive of soft tissue attenuation. In aggregate the current study is low risk in regards to predicted annual cardiovascular mortality rate.      Remote pacemaker interrogation 01/27/2022:  Device Check Results                         Remote Transmission Date Range: 2022-01-23 - 2022-01-23   Last In-Office or Inpatient Check: 11/07/2021      Device Type:  IPG   Advisory:     Lead Placement: Right Atrium - Connected  Right Ventricle - Connected      Mode: AAIR<=>DDDR   Battery Longevity: 31.0 months (2.58 years)      Pacemaker Dependent:  yes   Pacing Percentages: A Pacing: 99.97 %  RV Pacing: 0.06 %             Interpretation Summary     Title: Normal Remote: No Events    * Normal Device Function  * Alerts or events: None  * Battery: OK, 2.58 yrs   * Sensing, impedance and thresholds reviewed  * Programmed parameters reviewed  * Presenting rhythm reviewed, APVS 70s  * Heart Rate Histograms reviewed, Some HR response            Encounter Summary: This report includes 1 transmission that was received on 2022-01-23. Battery, lead impedance, sensing amplitude and pacing threshold data was reviewed.     Cardiovascular Health Factors  Vitals BP Readings from Last 3 Encounters:   04/22/22 (!) 145/83   02/11/22 114/62   05/02/21 138/72     Wt Readings from Last 3 Encounters:   04/22/22 92.4 kg (203 lb 12.8 oz)   02/11/22 96.2 kg (212 lb)   11/01/21 93.9 kg (207 lb)     BMI Readings from Last 3 Encounters:   04/22/22 26.89 kg/m?   02/11/22 27.97 kg/m?   11/01/21 27.31 kg/m?      Smoking Social History     Tobacco Use   Smoking Status Never   Smokeless Tobacco Never      Lipid Profile Cholesterol   Date Value Ref Range Status   09/10/2018 117  Final     HDL   Date Value Ref Range Status   09/10/2018 44  Final     LDL   Date Value Ref Range Status   09/10/2018 56  Final     Triglycerides   Date Value Ref Range Status   05/28/2020 101 <150 MG/DL Final      Blood Sugar No results found for: HGBA1C  Glucose   Date Value Ref Range Status   12/25/2021 109 (H) 70 - 105 Final   02/21/2021 86  Final   05/30/2020 100 70 - 100 MG/DL Final     Glucose, POC  Date Value Ref Range Status   05/30/2020 130 (H) 70 - 100 MG/DL Final   16/10/9602 540 (H) 70 - 100 MG/DL Final   98/11/9145 829 (H) 70 - 100 MG/DL Final          Problems Addressed Today  Encounter Diagnoses   Name Primary?    Cardiovascular symptoms Yes    Hypertensive heart disease with heart failure (HCC)        Assessment and Plan   Mr. Bosarge appears stable from a cardiovascular perspective and reports no angina or palpitations.  Currently on examination today he is not in heart failure.  However, possibly with increased salt intake diastolic dysfunction could contribute to his dyspnea.  Therefore, I gave him a prescription for Lasix 20 mg daily with 10 Meq of potassium to take for increasing dyspnea, edema and weight gain of 5 pounds.  I spent a long period of time talking to him about his anticoagulation options. The risks and benefits of anticoagulation therapy have been reviewed with the patient. The patient understands that anticoagulation is used to decrease thrombotic or clotting complications associated with atrial fibrillation/flutter, such as stroke and systemic embolization which can be disabling or fatal, but can, on occasion, lead to life-threatening bleeding complications including gastrointestinal or intracranial hemorrhage. The patient wishes to continue anticoagulation with apixaban 5 mg twice daily.  He does have the option of switching from apixaban to warfarin if the apixaban becomes unaffordable.  However, for the present time he would like to continue apixaban.  Since he is taking therapeutic apixaban he has the option of stopping his aspirin.  He was so informed.  He is quite functional for his age and does water exercises and walks.  I asked him to return for follow-up in 6 months time. The total time spent during this interview and exam with preparation and chart review was 30 minutes.            Current Medications (including today's revisions)   acetaminophen (TYLENOL) 500 mg tablet Take one tablet by mouth twice daily. Max of 4,000 mg of acetaminophen in 24 hours.    allopurinol (ZYLOPRIM) 100 mg tablet Take one tablet by mouth daily.    apixaban (ELIQUIS) 5 mg tablet Take one tablet by mouth twice daily.    aspirin EC (ASPIR-LOW) 81 mg tablet Take one tablet by mouth daily.    atorvastatin (LIPITOR) 20 mg tablet TAKE 1 TABLET EVERY DAY coenzyme Q10 100 mg cap Take 50 mg by mouth at bedtime daily.    diclofenac sodium (VOLTAREN) 1 % topical gel Apply four g topically to affected area as Needed.    duloxetine DR (CYMBALTA) 30 mg capsule Take one capsule by mouth daily.    furosemide (LASIX) 20 mg tablet Take one tablet by mouth every morning.    gabapentin (NEURONTIN) 600 mg tablet Take one tablet by mouth twice daily.    GLUCOSAMINE SULFATE PO Take 500 mg by mouth three times daily with meals.    hydroxychloroquine (PLAQUENIL) 200 mg PO tablet Take one tablet by mouth twice daily.    loratadine (CLARITIN) 10 mg tablet Take one tablet by mouth every morning.    meclizine (ANTIVERT) 25 mg tablet Take one tablet by mouth daily.    metoprolol succinate XL (TOPROL XL) 25 mg extended release tablet TAKE 1 TABLET EVERY DAY    mirabegron (MYRBETRIQ) 25 mg ER tablet Take one tablet by mouth daily.    mirtazapine (REMERON) 15 mg tablet  Take one tablet by mouth at bedtime daily.    nitroglycerin (NITROSTAT) 0.4 mg tablet DISSOLVE ONE TABLET UNDER THE TONGUE EVERY 5 MINUTES AS NEEDED FOR CHEST PAIN.  DO NOT EXCEED A TOTAL OF 3 DOSES IN 15 MINUTES    potassium chloride (K-DUR 10) 10 mEq tablet Take one tablet by mouth daily with food.    primidone (MYSOLINE) 50 mg tablet Take four tablets by mouth at bedtime daily.    sennosides/docusate sodium (SENNA PLUS PO) Take  by mouth twice daily.

## 2022-04-22 NOTE — Patient Instructions
Thank you for visiting our office today.    We would like to make the following medication adjustments:    Lasix 20mg  with Potassium 35mEq as needed       Otherwise continue the same medications as you have been doing.          We will be pursuing the following tests after your appointment today:       Orders Placed This Encounter    ECG 12-LEAD    furosemide (LASIX) 20 mg tablet    potassium chloride (K-DUR 10) 10 mEq tablet         We will plan to see you back in July.  Please call us in the meantime with any questions or concerns.        Please allow 5-7 business days for our providers to review your results. All normal results will go to MyChart. If you do not have Mychart, it is strongly recommended to get this so you can easily view all your results. If you do not have mychart, we will attempt to call you once with normal lab and testing results. If we cannot reach you by phone with normal results, we will send you a letter.  If you have not heard the results of your testing after one week please give [place] a call.       Your Cardiovascular Medicine [place] Team Richardson Landry, Rene Kocher, Threasa Beards, and [place])  phone number is [phone].

## 2022-04-29 ENCOUNTER — Encounter: Admit: 2022-04-29 | Discharge: 2022-04-29 | Payer: MEDICARE

## 2022-05-27 ENCOUNTER — Encounter: Admit: 2022-05-27 | Discharge: 2022-05-27 | Payer: MEDICARE

## 2022-05-27 MED ORDER — APIXABAN 5 MG PO TAB
5 mg | ORAL_TABLET | Freq: Two times a day (BID) | ORAL | 3 refills | Status: AC
Start: 2022-05-27 — End: ?

## 2022-06-10 ENCOUNTER — Encounter: Admit: 2022-06-10 | Discharge: 2022-06-10 | Payer: MEDICARE

## 2022-06-10 MED ORDER — APIXABAN 5 MG PO TAB
5 mg | ORAL_TABLET | Freq: Two times a day (BID) | ORAL | 3 refills | Status: AC
Start: 2022-06-10 — End: ?

## 2022-07-23 ENCOUNTER — Encounter: Admit: 2022-07-23 | Discharge: 2022-07-23 | Payer: MEDICARE

## 2022-07-23 MED ORDER — NITROGLYCERIN 0.4 MG SL SUBL
ORAL_TABLET | SUBLINGUAL | 0 refills | 9.00000 days | Status: AC
Start: 2022-07-23 — End: ?

## 2022-08-01 ENCOUNTER — Encounter: Admit: 2022-08-01 | Discharge: 2022-08-01 | Payer: MEDICARE

## 2022-08-26 ENCOUNTER — Encounter: Admit: 2022-08-26 | Discharge: 2022-08-26 | Payer: MEDICARE

## 2022-08-26 DIAGNOSIS — I200 Unstable angina: Secondary | ICD-10-CM

## 2022-08-26 DIAGNOSIS — K289 Gastrojejunal ulcer, unspecified as acute or chronic, without hemorrhage or perforation: Secondary | ICD-10-CM

## 2022-08-26 DIAGNOSIS — B079 Viral wart, unspecified: Secondary | ICD-10-CM

## 2022-08-26 DIAGNOSIS — I2510 Atherosclerotic heart disease of native coronary artery without angina pectoris: Secondary | ICD-10-CM

## 2022-08-26 DIAGNOSIS — D692 Other nonthrombocytopenic purpura: Secondary | ICD-10-CM

## 2022-08-26 DIAGNOSIS — R0989 Other specified symptoms and signs involving the circulatory and respiratory systems: Secondary | ICD-10-CM

## 2022-08-26 DIAGNOSIS — L814 Other melanin hyperpigmentation: Secondary | ICD-10-CM

## 2022-08-26 DIAGNOSIS — I110 Hypertensive heart disease with heart failure: Secondary | ICD-10-CM

## 2022-08-26 DIAGNOSIS — I10 Essential (primary) hypertension: Secondary | ICD-10-CM

## 2022-08-26 DIAGNOSIS — I480 Paroxysmal atrial fibrillation: Secondary | ICD-10-CM

## 2022-08-26 DIAGNOSIS — R972 Elevated prostate specific antigen [PSA]: Secondary | ICD-10-CM

## 2022-08-26 DIAGNOSIS — I8390 Asymptomatic varicose veins of unspecified lower extremity: Secondary | ICD-10-CM

## 2022-08-26 DIAGNOSIS — L989 Disorder of the skin and subcutaneous tissue, unspecified: Secondary | ICD-10-CM

## 2022-08-26 DIAGNOSIS — L570 Actinic keratosis: Secondary | ICD-10-CM

## 2022-08-26 DIAGNOSIS — M069 Rheumatoid arthritis, unspecified: Secondary | ICD-10-CM

## 2022-08-26 DIAGNOSIS — L853 Xerosis cutis: Secondary | ICD-10-CM

## 2022-08-26 DIAGNOSIS — I219 Acute myocardial infarction, unspecified: Secondary | ICD-10-CM

## 2022-08-26 DIAGNOSIS — IMO0002 Degenerative disc disease: Secondary | ICD-10-CM

## 2022-08-26 DIAGNOSIS — K529 Noninfective gastroenteritis and colitis, unspecified: Secondary | ICD-10-CM

## 2022-08-26 DIAGNOSIS — E785 Hyperlipidemia, unspecified: Secondary | ICD-10-CM

## 2022-08-26 LAB — BASIC METABOLIC PANEL
BLD UREA NITROGEN: 20
CALCIUM: 9.1
CHLORIDE: 104
CO2: 23
CREATININE: 1.1
GFR ESTIMATED: 63
GLUCOSE,PANEL: 94
POTASSIUM: 4.7
SODIUM: 138

## 2022-08-26 NOTE — Patient Instructions
Thank you for visiting our office today.    We would like to make the following medication adjustments:  NONE       Otherwise continue the same medications as you have been doing.          We will be pursuing the following tests after your appointment today:       Orders Placed This Encounter    BASIC METABOLIC PANEL    ECG 12-LEAD         We will plan to see you back in 6 months.  Please call us in the meantime with any questions or concerns.        Please allow 5-7 business days for our providers to review your results. All normal results will go to MyChart. If you do not have Mychart, it is strongly recommended to get this so you can easily view all your results. If you do not have mychart, we will attempt to call you once with normal lab and testing results. If we cannot reach you by phone with normal results, we will send you a letter.  If you have not heard the results of your testing after one week please give [place] a call.       Your Cardiovascular Medicine [place] Team Brett Canales, Pilar Jarvis, Shawna Orleans, and [place])  phone number is [phone].

## 2022-09-16 ENCOUNTER — Encounter: Admit: 2022-09-16 | Discharge: 2022-09-16 | Payer: MEDICARE

## 2022-09-16 MED ORDER — APIXABAN 5 MG PO TAB
5 mg | ORAL_TABLET | Freq: Two times a day (BID) | ORAL | 3 refills | Status: AC
Start: 2022-09-16 — End: 2022-09-18

## 2022-09-18 ENCOUNTER — Encounter: Admit: 2022-09-18 | Discharge: 2022-09-18 | Payer: MEDICARE

## 2022-09-18 MED ORDER — APIXABAN 5 MG PO TAB
5 mg | ORAL_TABLET | Freq: Two times a day (BID) | ORAL | 3 refills | Status: AC
Start: 2022-09-18 — End: ?

## 2022-10-23 ENCOUNTER — Encounter: Admit: 2022-10-23 | Discharge: 2022-10-23 | Payer: MEDICARE

## 2022-10-23 MED ORDER — APIXABAN 5 MG PO TAB
5 mg | ORAL_TABLET | Freq: Two times a day (BID) | ORAL | 3 refills | Status: AC
Start: 2022-10-23 — End: ?

## 2022-10-29 ENCOUNTER — Encounter: Admit: 2022-10-29 | Discharge: 2022-10-29 | Payer: MEDICARE

## 2022-11-14 ENCOUNTER — Encounter: Admit: 2022-11-14 | Discharge: 2022-11-14 | Payer: MEDICARE

## 2022-12-31 ENCOUNTER — Encounter: Admit: 2022-12-31 | Discharge: 2022-12-31 | Payer: MEDICARE

## 2022-12-31 DIAGNOSIS — I480 Paroxysmal atrial fibrillation: Secondary | ICD-10-CM

## 2022-12-31 DIAGNOSIS — R001 Bradycardia, unspecified: Secondary | ICD-10-CM

## 2022-12-31 DIAGNOSIS — Z950 Presence of cardiac pacemaker: Secondary | ICD-10-CM

## 2023-02-17 ENCOUNTER — Encounter: Admit: 2023-02-17 | Discharge: 2023-02-17 | Payer: MEDICARE

## 2023-02-17 NOTE — Telephone Encounter
Patient was scheduled for a [employer] Carelink on 02/11/23 that has not been received. Patient was instructed to send a manual transmission. Instructed if the transmitter does not appear to be working properly, they need to contact the device company directly. Patient was provided with that contact number. Requested the patient send [place] a MyChart message or contact our device nurses at [phone] to let us know after they have sent their transmission. LVM for pt to send in a transmission. BC      Note: Patient needs to send a manual transmission as we did not receive their scheduled remote interrogation.

## 2023-03-02 ENCOUNTER — Ambulatory Visit: Admit: 2023-03-02 | Discharge: 2023-03-02 | Payer: MEDICARE

## 2023-03-02 ENCOUNTER — Encounter: Admit: 2023-03-02 | Discharge: 2023-03-02 | Payer: MEDICARE

## 2023-03-02 DIAGNOSIS — L821 Other seborrheic keratosis: Secondary | ICD-10-CM

## 2023-03-02 DIAGNOSIS — L219 Seborrheic dermatitis, unspecified: Secondary | ICD-10-CM

## 2023-03-02 DIAGNOSIS — D229 Melanocytic nevi, unspecified: Secondary | ICD-10-CM

## 2023-03-02 DIAGNOSIS — Z85828 Personal history of other malignant neoplasm of skin: Secondary | ICD-10-CM

## 2023-03-02 DIAGNOSIS — D489 Neoplasm of uncertain behavior, unspecified: Secondary | ICD-10-CM

## 2023-03-02 DIAGNOSIS — L570 Actinic keratosis: Secondary | ICD-10-CM

## 2023-03-02 MED ORDER — KETOCONAZOLE 2 % TP SHAM
TOPICAL | 3 refills | 30.00000 days | Status: AC
Start: 2023-03-02 — End: ?

## 2023-03-02 NOTE — Progress Notes
Date of Service: 03/02/2023    Subjective:             Kenneth Robles is a 89 y.o. male.    History of Present Illness  Return pt. LV 10/2021 with Dr. Kathie Dike.     # H/o squamous cell carcinoma   - L superior helix s/p Mohs 2015      # Actinic Keratosis  - LN2 and PDT in the past.       # Melanocytic nevi distributed over the head, trunk, arms and legs.  - H/o blistering sunburns     # Seborrheic dermatitis  - previously prescribed keto shampoo, thought this was too drying   - currently using OTC shampoo with conditioner and baby oil  - is bothered by the scaling of his scalp today     # Dry lips  - pt regularly licks, bites lips  - asymptomatic  - improved from prior visit, not concerned by this today     Family Hx: Brother with skin cancer  Social Hx: Retired, used to work in [employer]. Live in [place], [place].         Objective:         acetaminophen (TYLENOL) 500 mg tablet Take one tablet by mouth twice daily. Max of 4,000 mg of acetaminophen in 24 hours.    allopurinol (ZYLOPRIM) 100 mg tablet Take one tablet by mouth daily.    apixaban (ELIQUIS) 5 mg tablet Take one tablet by mouth twice daily.    aspirin EC (ASPIR-LOW) 81 mg tablet Take one tablet by mouth daily.    atorvastatin (LIPITOR) 20 mg tablet TAKE 1 TABLET EVERY DAY    coenzyme Q10 100 mg cap Take 50 mg by mouth at bedtime daily.    diclofenac sodium (VOLTAREN) 1 % topical gel Apply four g topically to affected area as Needed.    duloxetine DR (CYMBALTA) 30 mg capsule Take one capsule by mouth daily.    furosemide (LASIX) 20 mg tablet Take one tablet by mouth every morning. (Patient taking differently: Take one tablet by mouth as Needed.)    gabapentin (NEURONTIN) 600 mg tablet Take one tablet by mouth three times daily.    GLUCOSAMINE SULFATE PO Take 500 mg by mouth daily.    hydroxychloroquine (PLAQUENIL) 200 mg PO tablet Take one tablet by mouth twice daily.    loratadine (CLARITIN) 10 mg tablet Take one tablet by mouth every morning. meclizine (ANTIVERT) 25 mg tablet Take one tablet by mouth daily.    metoprolol succinate XL (TOPROL XL) 25 mg extended release tablet TAKE 1 TABLET EVERY DAY    mirabegron (MYRBETRIQ) 25 mg ER tablet Take one tablet by mouth daily.    mirtazapine (REMERON) 15 mg tablet Take one tablet by mouth at bedtime daily.    nitroglycerin (NITROSTAT) 0.4 mg tablet DISSOLVE ONE TABLET UNDER THE TONGUE EVERY 5 MINUTES AS NEEDED FOR CHEST PAIN.  DO NOT EXCEED A TOTAL OF 3 DOSES IN 15 MINUTES    potassium chloride (K-DUR 10) 10 mEq tablet Take one tablet by mouth daily with food.    primidone (MYSOLINE) 50 mg tablet Take four tablets by mouth at bedtime daily.    sennosides/docusate sodium (SENNA PLUS PO) Take  by mouth twice daily.    tamsulosin (FLOMAX) 0.4 mg capsule Take one capsule by mouth daily.     Vitals:    03/02/23 1318   Resp: 16   PainSc: Zero   Weight: 93.4 kg (  206 lb)   Height: 185.4 cm (6' 1)     Body mass index is 27.18 kg/m?[name]     Physical Exam  Areas Examined (all normal unless noted below):  Head/Face  Neck  Chest  Back  Abdomen  Buttocks  R upper ext  L upper ext  R lower ext  L lower ext     Pertinent findings include:  Scar at site of prior SCC on L superior helix    Soft, pigmented, stuck-on-appearing papules are distributed over the examined areas.  All have symmetric pebbled dermoscopic findings.    Multiple brown and tan evenly pigmented macules are distributed over the examined areas.  All have symmetric similar dermascopic findings with primarily globular and reticular patterns.     Erythematous scaly papule(s) are noted on the left nose x1, right forearm x1, right cheek x1, left cheek x1, left forearm x1    NUO A) left neck with a 0.8 x 0.6 cm pink thin papule with crisp vessels under dermoscopy       Assessment and Plan:  # NUO A) Left neck  - Ddx: sBCC > other  - Shave biopsy performed today   - Photodocumentation completed and placed in the chart   - Will follow up results with the patient, contact information confirmed  - Discussed wound care    # History of squamous cell carcinoma in 2015   - No evidence of recurrence     # Actinic Keratoses  -Discussed diagnosis, etiology, and recommended treatment options  -Discussed risks and benefits of LN2 including expected blistering/scabbing with possibility of depigmentation and scarring.  -LN2 -f/t/f x 5 lesions  -Counseled to call for reevaluation if lesions do not resolve  -sunprotection advised     # Melanocytic nevi  - Will cont to monitor  - RTC for new/changing lesions     # Seborrheic keratoses  - Reassurance     # Seborrheic dermatitis  - Continue clobetasol solution daily as needed for itch.   - Start Ketoconazole Shampoo 2% 2-3 times per week, leave in 5-10 minutes before washing out. Can alternate this with OTC anti-dandruff shampoo.     RTC 1 year or sooner prn (pending bx result)    Liquid Nitrogen Procedure Note    Risk and benefits of the above procedure including pain, dyspigmentation, scar, infection, recurrence were discussed with the patient (or legal guardian) in detail, who afterwards decided to proceed with the procedure.    Verbal informed consent given  Diagnosis: see progress note  Body site: see progress note  Number of lesions: see progress note  Cycle duration: 10 sec  Number or cycles: 2   Wound care instructions given: Yes  Complications:  None  Tolerated well:  Yes  Ambulated from room:  Yes  Duration of procedure: >    Shave biopsy procedure note    Risk and benefits of the above procedure including bleeding, pain, dyspigmentation, scar, infection, recurrence or nerve damage with loss of muscle function and/or skin sensation were discussed with the patient (or legal guardian) in detail, who afterwards decided to proceed with the procedure.    Diagnosis: see progress note  Body Site: see progress note  Preparation:  Alcohol   Anesthesia:  1% lidocaine with epinephrine  Instrument:  Dermablade  Hemostasis:  AlCl3  Closure: None  Wound dressing:  Vaseline  Wound care instructions given:  Verbal  Complications:  None  Tolerated well: Yes  Ambulated from room:  Yes  Pathology sent to:  [facility]  Duration of proedure:  >5 minutes    Procedure Time Out Check List:  Prior to the start of the procedure, I personally confirmed the following:    Site Marking Verified: Yes, as appropriate  Patient Identity (name & date of birth): Yes  Procedure: Yes  Site: Yes  Body Part: see above    The risks of the procedure, including infection, bleeding, pain and skin changes, were discussed with the patient.

## 2023-03-02 NOTE — Progress Notes
ATTESTATION    I personally performed the key portions of the E/M visit, discussed case with the resident and concur with resident documentation of history, physical exam, assessment, and treatment plan unless otherwise noted.    I performed cryotherapy to 4lesions  today: liquid nitrogen was applied for 10-12 seconds to the skin lesions and the expected blistering or scabbing reaction explained. Patient was instructed to close eyes prior to start of procedure. Pt instructed not to pick at the area(s) and that hypopigmented scars may result from the procedure. Return if lesion fails to fully resolve. Patient tolerated procedure well, no complications.     I performed the shave biopsy of left neck and its  key components of the tangential (shave) biopsy including site identification, discussion with patient, biopsy type and choice, and was present during the procedure.         Staff name:  Candyce Churn, MD Date: 03/02/2023     Candyce Churn, MD   Assistant Professor   Dermatology

## 2023-03-02 NOTE — Patient Instructions
 Shave Biopsy Aftercare:  - Leave bandage in place for 24 hours  - After 24 hours, clean gently with soap and water daily  - Keep covered with generous amount of Vaseline until healed (typically 10-14 days)  - May use a bandage until healed (not required, but if you use one, change every 12-24 hours)  - Contact the clinic if you develop significant swelling, pain, and/or redness at biopsy site

## 2023-03-03 ENCOUNTER — Encounter: Admit: 2023-03-03 | Discharge: 2023-03-03 | Payer: MEDICARE

## 2023-03-04 ENCOUNTER — Encounter: Admit: 2023-03-04 | Discharge: 2023-03-04 | Payer: MEDICARE

## 2023-03-04 NOTE — Telephone Encounter
Contacted patient via phone to discuss benign biopsy results with patient per provider's request. Patient verbalizes understanding. No questions or further needs at time of phone call. Patient encouraged to reach out to clinic with any further needs/questions.

## 2023-03-12 ENCOUNTER — Encounter: Admit: 2023-03-12 | Discharge: 2023-03-13 | Payer: MEDICARE

## 2023-03-24 ENCOUNTER — Encounter: Admit: 2023-03-24 | Discharge: 2023-03-24 | Payer: MEDICARE

## 2023-03-24 NOTE — Telephone Encounter
 Keithan called in he got the letter from [place], so he sent sent [place] in a transmission,       Per [facility] we did get two transmission today from pt  about 2 mins apart. Explained that he is good to go.     CDJ

## 2023-03-25 ENCOUNTER — Encounter: Admit: 2023-03-25 | Discharge: 2023-03-26 | Payer: MEDICARE

## 2023-04-06 ENCOUNTER — Encounter: Admit: 2023-04-06 | Discharge: 2023-04-06 | Payer: MEDICARE

## 2023-04-06 MED ORDER — METOPROLOL SUCCINATE 25 MG PO TB24
25 mg | ORAL_TABLET | Freq: Every day | ORAL | 1 refills | 90.00000 days | Status: AC
Start: 2023-04-06 — End: ?

## 2023-04-07 ENCOUNTER — Encounter: Admit: 2023-04-07 | Discharge: 2023-04-07 | Payer: MEDICARE

## 2023-05-07 ENCOUNTER — Encounter: Admit: 2023-05-07 | Discharge: 2023-05-07

## 2023-05-07 DIAGNOSIS — R001 Bradycardia, unspecified: Secondary | ICD-10-CM

## 2023-05-07 DIAGNOSIS — Z950 Presence of cardiac pacemaker: Secondary | ICD-10-CM

## 2023-05-07 NOTE — Telephone Encounter
-----   Message from [place] V sent at 05/06/2023  2:42 PM CDT -----  Regarding: Labs ordered per [facility]  Pt has open labs ordered for CBC, CMP, and Lipid Panel ordered per Dr. Anda Bamberg that have not been completed. Pt has upcoming appt with [facility] in [place] on 05/21/23. Can you call pt and request he have labs drawn before upcoming appt?   Pt also has a PPM Device and has not had an in office check since October 2023. There is no open order for a future check. Can you place a new order and get pt scheduled for in office device check?   Thank you!!

## 2023-05-11 ENCOUNTER — Encounter: Admit: 2023-05-11 | Discharge: 2023-05-11 | Payer: MEDICARE

## 2023-05-11 DIAGNOSIS — E782 Mixed hyperlipidemia: Secondary | ICD-10-CM

## 2023-05-11 DIAGNOSIS — I2510 Atherosclerotic heart disease of native coronary artery without angina pectoris: Secondary | ICD-10-CM

## 2023-05-11 DIAGNOSIS — I10 Essential (primary) hypertension: Secondary | ICD-10-CM

## 2023-05-11 DIAGNOSIS — I480 Paroxysmal atrial fibrillation: Secondary | ICD-10-CM

## 2023-05-11 LAB — LIPID PROFILE
CHOLESTEROL: 106 (ref <200)
HDL: 39 — ABNORMAL LOW (ref >=40)
LDL: 50 (ref <100)
TRIGLYCERIDES: 86 (ref <150)

## 2023-05-11 LAB — COMPREHENSIVE METABOLIC PANEL
ALT: 14 (ref <45)
AST: 20 (ref 11–34)
BLD UREA NITROGEN: 14 (ref 8.4–25.7)
CALCIUM: 9 (ref 8.4–10.2)
CHLORIDE: 105 (ref 98–107)
CO2: 20 — ABNORMAL LOW (ref 23–31)
CREATININE: 0.9 (ref 0.72–1.25)
GLUCOSE,PANEL: 101 (ref 70–105)
POTASSIUM: 3.5 (ref 3.5–5.1)
SODIUM: 139 (ref 136–145)

## 2023-05-11 LAB — CBC
HEMATOCRIT: 34 — ABNORMAL LOW (ref 40.1–51.0)
HEMOGLOBIN: 11 — ABNORMAL LOW (ref 13.7–17.5)
MCH: 31
MCHC: 33
MCV: 93 — ABNORMAL HIGH (ref 79.0–92.2)
MPV: 9.6
PLATELET COUNT: 157 — ABNORMAL LOW (ref 163–337)
RBC COUNT: 3.7 — ABNORMAL LOW (ref 4.63–6.08)
RDW: 14
WBC COUNT: 7.2

## 2023-05-21 ENCOUNTER — Encounter: Admit: 2023-05-21 | Discharge: 2023-05-21 | Payer: MEDICARE

## 2023-05-21 ENCOUNTER — Ambulatory Visit: Admit: 2023-05-21 | Discharge: 2023-05-22 | Payer: MEDICARE

## 2023-05-21 DIAGNOSIS — R0989 Other specified symptoms and signs involving the circulatory and respiratory systems: Secondary | ICD-10-CM

## 2023-05-21 DIAGNOSIS — I480 Paroxysmal atrial fibrillation: Secondary | ICD-10-CM

## 2023-05-21 DIAGNOSIS — I110 Hypertensive heart disease with heart failure: Secondary | ICD-10-CM

## 2023-05-21 NOTE — Progress Notes
 Date of Service: 05/21/2023    Kenneth Robles is a 89 y.o. male.       HPI   Kenneth Robles is followed for coronary artery disease and paroxysmal atrial fibrillation.  He is prone to constipation and was hospitalized on 05/14/2023 for nausea vomiting and abdominal discomfort.  He is becoming progressively more deconditioned with advancing age.  He was going [facility] [employer] 3 days a week for water  aerobics but has not gone recently because of his nausea and constipation.  Otherwise, over the past 6 months Kenneth Robles has felt well and reports no angina, congestive symptoms, palpitations, sensation of sustained forceful heart pounding, or syncope.  I do not believe that he is aware when he has paroxysmal atrial fibrillation.  His exercise tolerance has been stable. The patient reports no myalgias, bleeding abnormalities, or strokelike symptoms.  He continues to have chronic back,hip and knee pain for which he sees a rheumatologist and orthopedic surgeon.  The patient also reports that he has a pain control specialist. 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  His CHA2DS2-Vasc score is 3 and he currently states that he takes apixaban  5 mg twice daily.  In August 2017, his atorvastatin  dose was decreased to 20 mg daily because of myalgias, and he is tolerating this dose without adverse effects. Kenneth Robles reported orthostasis in late August 2018 and his Imdur was stopped.       Kenneth Robles past medical history is noteworthy for permanent pacemaker placement on 01/22/06. On 09/14/06 he underwent atrial fibrillation ablation complicated by perciardial effusion requiring pericardiocentesis. He underwent successful PCI of the mid and proximal LAD with 2 overlapping Xience drug-eluting stents, a 2.5 x 28 and a 3.0 x 15 Xience drug-eluting stent from distal to proximal on 05/23/09. Coronary angiography performed on 01/07/13 revealed    Moderate ostial LAD stenosis 40 percent to 50 percent, with a haziness in the mid LAD segment. The proximal LAD was widely patent and the FFR of the LAD was 0.84 at maximal hyperemia.  Chronic total occlusion of a high obtuse marginal branch with left-to-left collaterals, which was unchanged from the prior study dated 2011.   Normal left ventricular end-diastolic pressures.  Kenneth Robles was seen on 06/08/14 after his pacemaker had reached elective replacement interval and had defaulted to VVI pacing. At that time he had developed dyspnea with exertion. He underwent pacemaker generator replacement on 06/12/14. He tripped over a tool in his garage on 03/10/15 resulting in multiple ecchymosis to his face. He indicates that he was evaluated and that there were no serious injuries. Kenneth Robles saw Dr. Clora Dane on 08/13/2015 because R-wave sensing on the right ventricular lead of his pacemaker had decreased.  No action was required. The patient was hospitalized locally on September 03, 2016 for right lower lobe infiltrate/pneumonia was treated initially with Zosyn and then with Augmentin and albuterol inhaler.  Apparently he had paroxysmal atrial fibrillation at the same time which resolved spontaneously. The patient was hospitalized on December 09, 2016 for chest discomfort.  There was no evidence for an acute coronary syndrome.  Coronary angiography was performed which showed mild to moderate nonobstructive coronary disease without high-grade stenosis.  No coronary intervention was required.  On November 02, 2017 while at the pool, he he stood up too quickly and became momentarily lightheaded and fell striking his head. A CT scan of the spine and head work was  obtained but showed no major bleeding.  On 05/12/2020 he developed small bowel obstruction.  He underwent exploratory laparotomy that day at [facility] hospital was found to have for ischemic enteritis.  He underwent repeat exploratory laparotomy on 05/14/2020.        Vitals:    05/21/23 1102   BP: 100/61   BP Source: Arm, Left Upper   Pulse: 72   SpO2: 97%   O2 Device: None (Room air)   PainSc: Seven   Weight: 91.1 kg (200 lb 12.8 oz)   Height: 185.4 cm (6' 1)     Body mass index is 26.49 kg/m?Kenneth Robles     Past Medical History  Patient Active Problem List    Diagnosis Date Noted    Hypertensive heart disease with heart failure (CMS-HCC) 02/21/2021    Postoperative follow-up 07/13/2020    Acute blood loss anemia 05/15/2020    Overweight (BMI 25.0-29.9) 05/15/2020    Acute postoperative pain of abdomen 05/15/2020    Small bowel obstruction (CMS-HCC) 05/14/2020    Rheumatoid arthritis(714.0) 03/09/2014    Left-sided low back pain without sciatica 03/02/2014    Lumbar radiculopathy 03/02/2014    Spondylosis of lumbar region without myelopathy or radiculopathy 03/02/2014    Unspecified hyperplasia of prostate with urinary obstruction and other lower urinary tract symptoms (LUTS) 09/02/2011    Pigmented skin lesions - bilateral ear 11/25/2009     Solar lentigo with actinic purpura        CAD (coronary artery disease) 07/05/2009     Admitted with unstable angina. Cardiac cath on 05/23/2009 with successful PCI with overlapping Xience drug-eluting stents, a 2.5 x 28 and a 2.5 x 15 from distal to proximal, postdilated with a 2.75 and subsequently a 3.0 balloon.    08/29/2015 - Stress Test:  This study is mildly abnormal, indicative of a small mild intensity area of reversible ischemia involving the distal inferior wall.  The appearance is very similar to what was seen in 2012. The left ventricular systolic function is normal. There are no high risk prognostic indicators present.        Osteoarthritis 05/21/2009    PAF (paroxysmal atrial fibrillation) (CMS-HCC) 09/14/2006     Paroxysmal symptomatic atrial fibrillation controlled with Tikosyn. Previous left atrial ablation attempt in August 2008 with Dr. Sanjuanita Cruz. Unfortunately, it was complicated by cardiac tamponade, but the patient did well subsequently. He was placed on Tikosyn which seems to control his arrhythmia very well.     04/2010. Mr Robles declined restarting Tikosyn because atrial fibrillation wasn't frequent and it was very expensive. Target INR is 2.5.      Hyperlipidemia 09/14/2006     Kenneth Robles has had problems with statin myalgias and side effects from niacin. He declines any future lipid lower therapies.      Cardiac pacemaker in situ 09/14/2006    Sinus bradycardia 01/22/2006    HTN (hypertension) 01/22/2006    CRI (chronic renal insufficiency) 01/22/2006         Review of Systems   Constitutional: Positive for malaise/fatigue.   HENT: Negative.     Eyes: Negative.    Cardiovascular:  Positive for chest pain and dyspnea on exertion.   Respiratory:  Positive for shortness of breath.    Endocrine: Negative.    Hematologic/Lymphatic: Negative.    Skin: Negative.    Musculoskeletal:  Positive for joint pain, muscle weakness and myalgias.   Gastrointestinal: Negative.    Genitourinary: Negative.    Neurological:  Positive for weakness.  Psychiatric/Behavioral: Negative.     Allergic/Immunologic: Negative.        Physical Exam  GENERAL: The patient is well developed, well nourished, resting comfortably and in no distress.    HEENT: No abnormalities of the visible oro-nasopharynx, conjunctiva or sclera are noted.  Wearing hearing aids.  NECK: There is no jugular venous distension. Carotids are palpable and without bruits. There is no thyroid enlargement.  Chest: Lung fields are clear to auscultation. There are no wheezes or crackles. His pacemaker incision in left infraclavicular area looks good without erosion or cellulitis.  CV: There is a regular rhythm. The first and second heart sounds are normal. There are no murmurs, gallops or rubs. His apical heart rate is 80 BPM.  ABD: The abdomen is soft and supple with normal bowel sounds. There is no hepatosplenomegaly, ascites, tenderness, masses or bruits.  His abdominal incision is healed nicely.  Neuro: There are no focal motor defects.  Transition and ambulation are slow.  Cognitive function appears normal.  Ext: There is no edema or evidence of deep vein thrombosis. Good radial pulses. Good posterior tibial pulses. Decreased right dorsalis pulse but good capillary refill and no ischemic ulcers.  SKIN: No cellulitis or skin rashes noted.  PSYCH: The patient is calm, rationale and oriented.    Cardiovascular Studies  A twelve-lead ECG obtained on 05/21/2023 reveals an atrial paced rhythm with a heart rate of 77 bpm.  Right bundle branch block is noted along with left anterior fascicular block.    Remote pacemaker interrogation 03/25/2023:  Device Check Results               Remote Transmission Date Range:   -     Last In-Office or Inpatient Check: 11/07/2021      Device Type:  IPG   Advisory:     Lead Placement: Right Atrium - Connected  Right Ventricle - Connected      Mode: AAIR<=>DDDR   Battery Longevity: 19.0 months  (1.58 years)      Pacemaker Dependent:  yes   MRI Conditional: yes   Pacing Percentages: A Pacing: 100.00 %  RV Pacing: 0.00 %             Interpretation Summary  Show Result ComparisonTitle: Remote Check    Date of Interrogation: 24-Mar-2023     * Alerts or Events: 0  * Battery: OK, 1.58 yrs   * Presenting rhythm reviewed AP-VS 85 bpm with 1AVB, AV delay ~  * MVP On to minimize VP, Total VP <0.1%   * AP 100%   * Sensing, impedance and thresholds reviewed stable trends within normal limits    * Programmed parameters reviewed  * Heart Rate Histograms reviewed minimal rate distribution  at 70s and 80s bpm, not much rate distribution > 90 bpm           Echo Doppler 05/02/2021:  Interpretation Summary  The left ventricular systolic function is normal. The visually estimated ejection fraction is 55%.  Mild concentric hypertrophy.  Abnormal septal motion consistent with right ventricular pacing.  Grade I (mild) left ventricular diastolic dysfunction. Normal left atrial pressure.  The right ventricle is mildly dilated with preserved systolic function. M-Mode TAPSE 1.9 cm (normal >1.7 cm).  Trace mitral valve regurgitation, mild tricuspid valve regurgitation.  Normal valve structure, trileaflet, no stenosis, trace regurgitation.  Mild pulmonary hypertension, estimated PAP = 47 mmHg.  Compared to the previous study dated 05/12/2020: LVEF 65%, no significant valvular abnormalities, estimated  PAP 31 mmHg + RAP (on the previous study IVC was not well visualized).      Regadenoson time stress test 05/02/2021:  Scintigraphic Findings:  Planar images reveal the left ventricular cavity is normal in size.  There is normal pulmonary tracer uptake.  There is diaphragmatic attenuation noted.  There is extracardiac uptake from the small bowel near the inferior wall. No transient ischemic dilation is present. Tomographic images were reconstructed in three orthogonal views.  There is a small size, mild intensity defect involving the inferior wall.  The defect is fixed with reverse redistribution.  The wall motion is preserved.  This probably represents diaphragmatic attenuation.  A definite ischemic change is not appreciated. Polar coordinate map identifies no perfusion abnormalities. TID Ratio:  0.96  (normal <1.36). Summed Stress Score:  0   , Summed Rest Score:  0  Regional Wall Thickening and Motion Post Stress:   There is normal left ventricular wall motion and thickening of all myocardial segments.Left Ventricular Ejection Fraction (post stress, in the resting state) =  69 %.   Left Ventricular End Diastolic Volume: 84 mL  SUMMARY/OPINION:  This study is probably normal.  The perfusion pattern is most consistent with diaphragmatic attenuation, a definite ischemic change is not appreciated.  Left ventricular systolic function is normal, the ejection fraction 69%. There are no high risk prognostic indicators present.  The pulmonary to myocardial count ratio is normal at 0.29, there is no transit ischemic dilatation.  The pharmacologic ECG portion of the study is negative for ischemia. The prior study was obtained on 08/29/2015.  It demonstrated an EF of 71%, the end-diastolic volume was 0.43, there is no transit ischemic dilatation.  It demonstrated a mixed defect involving the inferior wall suggestive of possible ischemia. Comparing the two studies qualitatively, the prior study demonstrate reversibility involving the inferior wall, the current study is fixed with reverse redistribution.  The current study is more suggestive of soft tissue attenuation. In aggregate the current study is low risk in regards to predicted annual cardiovascular mortality rate.   Cardiovascular Health Factors  Vitals BP Readings from Last 3 Encounters:   05/21/23 100/61   08/26/22 (!) 89/56   04/22/22 (!) 145/83     Wt Readings from Last 3 Encounters:   05/21/23 91.1 kg (200 lb 12.8 oz)   03/02/23 93.4 kg (206 lb)   08/26/22 93.4 kg (206 lb)     BMI Readings from Last 3 Encounters:   05/21/23 26.49 kg/m?   03/02/23 27.18 kg/m?   08/26/22 27.18 kg/m?      Smoking Social History     Tobacco Use   Smoking Status Never   Smokeless Tobacco Never      Lipid Profile Cholesterol   Date Value Ref Range Status   05/11/2023 106 <200 Final     HDL   Date Value Ref Range Status   05/11/2023 39 (L) >=40 Final     LDL   Date Value Ref Range Status   05/11/2023 50 <100 Final     Triglycerides   Date Value Ref Range Status   05/11/2023 86 <150 Final      Blood Sugar No results found for: HGBA1C  Glucose   Date Value Ref Range Status   05/18/2023 106 (H) 70 - 105 Final   05/11/2023 101 70 - 105 Final   08/26/2022 94  Final     Glucose, POC   Date Value Ref Range Status   05/30/2020 130 (H)  70 - 100 MG/DL Final   16/10/9602 540 (H) 70 - 100 MG/DL Final   98/11/9145 829 (H) 70 - 100 MG/DL Final          Problems Addressed Today  Encounter Diagnoses   Name Primary?    Cardiovascular symptoms Yes    Hypertensive heart disease with heart failure (CMS-HCC)     PAF (paroxysmal atrial fibrillation) (CMS-HCC)        Assessment and Plan     Kenneth Robles appears stable from a cardiovascular perspective and reports no angina or palpitations. Currently on examination today he is not in heart failure.  Most of his problems currently focused around chronic constipation which may improve with MiraLAX .  He is also becoming progressively more deconditioned and I encouraged him to resume water  aerobics at [facility] [employer].  He may also benefit by obtaining some weights that he can use to strengthen his arms at home and a pedal exercise machine to help strengthen his legs.  I asked him to be very cautious and to take fall precautions very seriously. I spent a long period of time talking to him about his anticoagulation options. The risks and benefits of anticoagulation therapy have been reviewed with the patient. The patient understands that anticoagulation is used to decrease thrombotic or clotting complications associated with atrial fibrillation/flutter, such as stroke and systemic embolization which can be disabling or fatal, but can, on occasion, lead to life-threatening bleeding complications including gastrointestinal or intracranial hemorrhage. The patient wishes to continue anticoagulation with apixaban  5 mg twice daily.  He is overdue for obtaining a comprehensive pacemaker interrogation which I have requested a soon as possible.  Otherwise I have asked him to maintain a regular schedule for obtaining remote and comprehensive pacemaker interrogations over time.  I asked him to return for follow-up in 6 months time. The total time spent during this interview and exam with preparation and chart review was 30 minutes.          Current Medications (including today's revisions)   acetaminophen  (TYLENOL  EXTRA STRENGTH) 500 mg tablet Take one tablet by mouth every morning. Max of 4,000 mg of acetaminophen  in 24 hours.    allopurinol  (ZYLOPRIM ) 100 mg tablet Take one tablet by mouth daily.    apixaban  (ELIQUIS ) 5 mg tablet Take one tablet by mouth twice daily.    atorvastatin  (LIPITOR) 20 mg tablet TAKE 1 TABLET EVERY DAY    coenzyme Q10 100 mg cap Take 50 mg by mouth at bedtime daily.    diclofenac sodium (VOLTAREN) 1 % topical gel Apply four g topically to affected area as Needed.    diphenhydrAMINE -acetaminophen  (TYLENOL  PM EXTRA STRENGTH) 25-500 mg tab tablet Take 1 tablet by mouth at bedtime daily.    duloxetine DR (CYMBALTA) 30 mg capsule Take one capsule by mouth daily.    GLUCOSAMINE SULFATE PO Take 500 mg by mouth daily.    hydroxychloroquine  (PLAQUENIL ) 200 mg PO tablet Take one tablet by mouth daily.    ketoconazole  (NIZORAL ) 2 % topical shampoo Apply  topically to affected area three times weekly. Apply topically to damp scalp, lather, leave on 10 minutes, and rinse. Can use up to three times weekly.  Indications: seborrheic dermatitis    loratadine (CLARITIN) 10 mg tablet Take one tablet by mouth every morning.    meclizine (ANTIVERT) 25 mg tablet Take one tablet by mouth daily.    metoprolol  succinate XL (TOPROL  XL) 25 mg extended release tablet TAKE 1 TABLET EVERY  DAY    mirabegron (MYRBETRIQ) 25 mg ER tablet Take one tablet by mouth daily.    nitroglycerin  (NITROSTAT ) 0.4 mg tablet DISSOLVE ONE TABLET UNDER THE TONGUE EVERY 5 MINUTES AS NEEDED FOR CHEST PAIN.  DO NOT EXCEED A TOTAL OF 3 DOSES IN 15 MINUTES    sennosides/docusate sodium (SENNA PLUS PO) Take  by mouth daily.    tamsulosin (FLOMAX) 0.4 mg capsule Take one capsule by mouth daily.

## 2023-05-21 NOTE — Patient Instructions
 Thank you for visiting our office today.    We would like to make the following medication adjustments:  NONE       Otherwise continue the same medications as you have been doing.          We will be pursuing the following tests after your appointment today:       Orders Placed This Encounter    ECG 12-LEAD     Schedule device check now    We will plan to see you back in 6 months.  Please call us  in the meantime with any questions or concerns.        Please allow 5-7 business days for our providers to review your results. All normal results will go to MyChart. If you do not have Mychart, it is strongly recommended to get this so you can easily view all your results. If you do not have mychart, we will attempt to call you once with normal lab and testing results. If we cannot reach you by phone with normal results, we will send you a letter.  If you have not heard the results of your testing after one week please give us  a call.       Your Cardi[facility] [place] Team Siegfried Dress, Towanda Fret, [place]rentice Brochure, and [place])  phone number is [phone].

## 2023-06-17 ENCOUNTER — Encounter: Admit: 2023-06-17 | Discharge: 2023-06-18 | Payer: MEDICARE

## 2023-06-17 ENCOUNTER — Encounter: Admit: 2023-06-17 | Discharge: 2023-06-17 | Payer: MEDICARE

## 2023-06-17 NOTE — Telephone Encounter
 Patient was scheduled for a [employer] Carelink on 06-11-23 that has not been received. Patient was instructed to send a manual transmission. Instructed if the transmitter does not appear to be working properly, they need to contact the device company directly. Patient was provided with that contact number. Requested the patient send us  a MyChart message or contact our device nurses at [phone] to let us  know after they have sent their transmission.     Called pt and asked him to send a remote as he was due on 06-11-23.  Pt states he is getting checked in office soon.  I asked him to go ahead and please send remote as scheduled.  He will send hopefully today he said. SH     Note: Patient needs to send a manual transmission as we did not receive their scheduled remote interrogation.

## 2023-06-24 ENCOUNTER — Encounter: Admit: 2023-06-24 | Discharge: 2023-06-24 | Payer: MEDICARE

## 2023-06-25 ENCOUNTER — Encounter: Admit: 2023-06-25 | Discharge: 2023-06-25 | Payer: MEDICARE

## 2023-07-04 ENCOUNTER — Encounter: Admit: 2023-07-04 | Discharge: 2023-07-04 | Payer: MEDICARE

## 2023-07-16 ENCOUNTER — Encounter: Admit: 2023-07-16 | Discharge: 2023-07-16 | Payer: MEDICARE

## 2023-07-16 ENCOUNTER — Ambulatory Visit: Admit: 2023-07-16 | Discharge: 2023-07-16 | Payer: MEDICARE

## 2023-07-16 DIAGNOSIS — Z950 Presence of cardiac pacemaker: Secondary | ICD-10-CM

## 2023-08-22 ENCOUNTER — Encounter: Admit: 2023-08-22 | Discharge: 2023-08-22 | Payer: MEDICARE

## 2023-09-17 ENCOUNTER — Encounter: Admit: 2023-09-17 | Discharge: 2023-09-17 | Payer: MEDICARE

## 2023-09-17 MED ORDER — ELIQUIS 5 MG PO TAB
5 mg | ORAL_TABLET | Freq: Two times a day (BID) | ORAL | 3 refills | 30.00000 days | Status: AC
Start: 2023-09-17 — End: ?

## 2023-09-22 ENCOUNTER — Encounter: Admit: 2023-09-22 | Discharge: 2023-09-22 | Payer: MEDICARE

## 2023-09-22 NOTE — Telephone Encounter
 Patient was scheduled for a [employer] Carelink on 09-16-23 that has not been received. Patient was instructed to send a manual transmission. Instructed if the transmitter does not appear to be working properly, they need to contact the device company directly. Patient was provided with that contact number. Requested the patient send us  a MyChart message or contact our device nurses at [phone] to let us  know after they have sent their transmission.    Called and lvm for patient to send transmission. SH      Note: Patient needs to send a manual transmission as we did not receive their scheduled remote interrogation.

## 2023-10-05 ENCOUNTER — Encounter: Admit: 2023-10-05 | Discharge: 2023-10-05 | Payer: MEDICARE

## 2023-10-05 NOTE — Telephone Encounter
 Pt called to advise us  he sent a transmission for his PPM and wanted to verify it came through.  It did come through and I called and had to LVM letting him know it came in.  SH

## 2023-10-12 ENCOUNTER — Encounter: Admit: 2023-10-12 | Discharge: 2023-10-13 | Payer: MEDICARE

## 2023-12-07 ENCOUNTER — Encounter: Admit: 2023-12-07 | Discharge: 2023-12-07 | Payer: MEDICARE

## 2023-12-08 ENCOUNTER — Encounter: Admit: 2023-12-08 | Discharge: 2023-12-08 | Payer: MEDICARE

## 2023-12-08 VITALS — BP 91/65 | HR 82 | Ht 73.0 in | Wt 205.8 lb

## 2023-12-08 DIAGNOSIS — I110 Hypertensive heart disease with heart failure: Principal | ICD-10-CM

## 2023-12-08 NOTE — Progress Notes [1]
 Date of Service: 12/08/2023    Kenneth Robles is a 89 y.o. male.       HPI   Kenneth Robles is followed for coronary artery disease and paroxysmal atrial fibrillation.  He is comfortable at rest but reports chronic dyspnea with exertion which is most likely due to deconditioning because he does not have any evidence of decompensated heart failure.  He is becoming progressively more deconditioned with advancing age.  He was doing better when he went to the [facility] [employer] 3 days a week for water  aerobics but has not gone recently because of constipation alternating with loose stools when he takes something for his constipation.  Otherwise, over the past 6 months Kenneth Robles has felt well and reports no angina, congestive symptoms, palpitations, sensation of sustained forceful heart pounding, or syncope.  I do not believe that he is aware when he has paroxysmal atrial fibrillation.  His exercise tolerance appears to be slowly decreasing with age.  The patient reports no myalgias, bleeding abnormalities, or strokelike symptoms.  He continues to have chronic back,hip and knee pain for which he sees a rheumatologist and orthopedic surgeon.  The patient also reports that he has a pain control specialist. 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  His CHA2DS2-Vasc score is 3 and he currently states that he takes apixaban  5 mg twice daily.  In August 2017, his atorvastatin  dose was decreased to 20 mg daily because of myalgias, and he is tolerating this dose without adverse effects. Kenneth Robles reported orthostasis in late August 2018 and his Imdur was stopped.       Kenneth Robles past medical history is noteworthy for permanent pacemaker placement on 01/22/06. On 09/14/06 he underwent atrial fibrillation ablation complicated by perciardial effusion requiring pericardiocentesis. He underwent successful PCI of the mid and proximal LAD with 2 overlapping Xience drug-eluting stents, a 2.5 x 28 and a 3.0 x 15 Xience drug-eluting stent from distal to proximal on 05/23/09. Coronary angiography performed on 01/07/13 revealed    Moderate ostial LAD stenosis 40 percent to 50 percent, with a haziness in the mid LAD segment. The proximal LAD was widely patent and the FFR of the LAD was 0.84 at maximal hyperemia.  Chronic total occlusion of a high obtuse marginal branch with left-to-left collaterals, which was unchanged from the prior study dated 2011.   Normal left ventricular end-diastolic pressures.  Kenneth Robles was seen on 06/08/14 after his pacemaker had reached elective replacement interval and had defaulted to VVI pacing. At that time he had developed dyspnea with exertion. He underwent pacemaker generator replacement on 06/12/14. He tripped over a tool in his garage on 03/10/15 resulting in multiple ecchymosis to his face. He indicates that he was evaluated and that there were no serious injuries. Kenneth Robles saw Dr. Liborio on 08/13/2015 because R-wave sensing on the right ventricular lead of his pacemaker had decreased.  No action was required. The patient was hospitalized locally on September 03, 2016 for right lower lobe infiltrate/pneumonia was treated initially with Zosyn and then with Augmentin and albuterol inhaler.  Apparently he had paroxysmal atrial fibrillation at the same time which resolved spontaneously. The patient was hospitalized on December 09, 2016 for chest discomfort.  There was no evidence for an acute coronary syndrome.  Coronary angiography was performed which showed mild to moderate nonobstructive coronary disease without high-grade stenosis.  No coronary intervention was required.  On November 02, 2017 while at the pool, he he stood up too quickly and became momentarily lightheaded and fell striking his head. A CT scan of the spine and head work was obtained but showed no major bleeding.  On 05/12/2020 he developed small bowel obstruction.  He underwent exploratory laparotomy that day at [facility], The hospital was found to have for ischemic enteritis.  He underwent repeat exploratory laparotomy on 05/14/2020. He is prone to constipation and was hospitalized on 05/14/2023 for nausea vomiting and abdominal discomfort.        Vitals:    12/08/23 1026   BP: 91/65   BP Source: Arm, Left Upper   Pulse: 82   SpO2: 94%   O2 Device: None (Room air)   PainSc: Five   Weight: 93.4 kg (205 lb 12.8 oz)   Height: 185.4 cm (6' 1)     Body mass index is 27.15 kg/m?SABRA     Past Medical History  Patient Active Problem List    Diagnosis Date Noted    Hypertensive heart disease with heart failure (CMS-HCC) 02/21/2021    Postoperative follow-up 07/13/2020    Acute blood loss anemia 05/15/2020    Overweight (BMI 25.0-29.9) 05/15/2020    Acute postoperative pain of abdomen 05/15/2020    Small bowel obstruction (CMS-HCC) 05/14/2020    Rheumatoid arthritis(714.0) 03/09/2014    Left-sided low back pain without sciatica 03/02/2014    Lumbar radiculopathy 03/02/2014    Spondylosis of lumbar region without myelopathy or radiculopathy 03/02/2014    Unspecified hyperplasia of prostate with urinary obstruction and other lower urinary tract symptoms (LUTS) 09/02/2011    Pigmented skin lesions - bilateral ear 11/25/2009     Solar lentigo with actinic purpura        CAD (coronary artery disease) 07/05/2009     Admitted with unstable angina. Cardiac cath on 05/23/2009 with successful PCI with overlapping Xience drug-eluting stents, a 2.5 x 28 and a 2.5 x 15 from distal to proximal, postdilated with a 2.75 and subsequently a 3.0 balloon.    08/29/2015 - Stress Test:  This study is mildly abnormal, indicative of a small mild intensity area of reversible ischemia involving the distal inferior wall.  The appearance is very similar to what was seen in 2012. The left ventricular systolic function is normal. There are no high risk prognostic indicators present.        Osteoarthritis 05/21/2009    PAF (paroxysmal atrial fibrillation) (CMS-HCC) 09/14/2006     Paroxysmal symptomatic atrial fibrillation controlled with Tikosyn. Previous left atrial ablation attempt in August 2008 with Dr. Hansel. Unfortunately, it was complicated by cardiac tamponade, but the patient did well subsequently. He was placed on Tikosyn which seems to control his arrhythmia very well.     04/2010. Mr Robles declined restarting Tikosyn because atrial fibrillation wasn't frequent and it was very expensive. Target INR is 2.5.      Hyperlipidemia 09/14/2006     Kenneth Robles has had problems with statin myalgias and side effects from niacin. He declines any future lipid lower therapies.      Cardiac pacemaker in situ 09/14/2006    Sinus bradycardia 01/22/2006    HTN (hypertension) 01/22/2006    CRI (chronic renal insufficiency) 01/22/2006         Review of Systems   Constitutional: Negative.   HENT: Negative.     Eyes: Negative.    Cardiovascular: Negative.    Respiratory: Negative.     Endocrine: Negative.    Hematologic/Lymphatic: Negative.  Skin: Negative.    Musculoskeletal: Negative.    Gastrointestinal: Negative.    Genitourinary: Negative.    Neurological: Negative.    Psychiatric/Behavioral: Negative.     Allergic/Immunologic: Negative.        Physical Exam  GENERAL: The patient is well developed, well nourished, resting comfortably and in no distress.    HEENT: No abnormalities of the visible oro-nasopharynx, conjunctiva or sclera are noted.  Wearing hearing aids.  NECK: There is no jugular venous distension. Carotids are palpable and without bruits. There is no thyroid enlargement.  Chest: Lung fields are clear to auscultation. There are no wheezes or crackles. His pacemaker incision in left infraclavicular area looks good without erosion or cellulitis.  CV: There is a regular rhythm. The first and second heart sounds are normal. There are no murmurs, gallops or rubs. His apical heart rate is 80 BPM.  ABD: The abdomen is soft and supple with normal bowel sounds. There is no hepatosplenomegaly, ascites, tenderness, masses or bruits.  His abdominal incision is healed nicely.  Neuro: There are no focal motor defects.  Transition and ambulation are slow.  Cognitive function appears normal.  Ext: There is no edema or evidence of deep vein thrombosis. Good radial pulses. Good posterior tibial pulses. Decreased right dorsalis pulse but good capillary refill and no ischemic ulcers.  SKIN: No cellulitis or skin rashes noted.  PSYCH: The patient is calm, rationale and oriented.    Cardiovascular Studies  A twelve-lead ECG obtained on 05/21/2023 reveals an atrial paced rhythm with a heart rate of 77 bpm. Right bundle branch block is noted along with left anterior fascicular block.   Echo Doppler 05/02/2021:    Interpretation Summary  The left ventricular systolic function is normal. The visually estimated ejection fraction is 55%.  Mild concentric hypertrophy.  Abnormal septal motion consistent with right ventricular pacing.  Grade I (mild) left ventricular diastolic dysfunction. Normal left atrial pressure.  The right ventricle is mildly dilated with preserved systolic function. M-Mode TAPSE 1.9 cm (normal >1.7 cm).  Trace mitral valve regurgitation, mild tricuspid valve regurgitation.  Normal valve structure, trileaflet, no stenosis, trace regurgitation.  Mild pulmonary hypertension, estimated PAP = 47 mmHg  Compared to the previous study dated 05/12/2020: LVEF 65%, no significant valvular abnormalities, estimated PAP 31 mmHg + RAP (on the previous study IVC was not well visualized).     A recent chest x-ray obtained on 11/12/2023 showed mild peribronchial thickening without any acute consolidation.    Cardiovascular Health Factors  Vitals BP Readings from Last 3 Encounters:   12/08/23 91/65   05/21/23 100/61   08/26/22 (!) 89/56     Wt Readings from Last 3 Encounters:   12/08/23 93.4 kg (205 lb 12.8 oz)   05/21/23 91.1 kg (200 lb 12.8 oz)   03/02/23 93.4 kg (206 lb)     BMI Readings from Last 3 Encounters:   12/08/23 27.15 kg/m?   05/21/23 26.49 kg/m?   03/02/23 27.18 kg/m?      Smoking Tobacco Use History[1]   Lipid Profile Cholesterol   Date Value Ref Range Status   05/11/2023 106 <200 Final     HDL   Date Value Ref Range Status   05/11/2023 39 (L) >=40 Final     LDL   Date Value Ref Range Status   05/11/2023 50 <100 Final     Triglycerides   Date Value Ref Range Status   05/11/2023 86 <150 Final      Blood  Sugar No results found for: HGBA1C  Glucose   Date Value Ref Range Status   11/12/2023 84  Final   05/18/2023 106 (H) 70 - 105 Final   05/11/2023 101 70 - 105 Final     Glucose, POC   Date Value Ref Range Status   05/30/2020 130 (H) 70 - 100 MG/DL Final   94/95/7977 873 (H) 70 - 100 MG/DL Final   94/96/7977 866 (H) 70 - 100 MG/DL Final          Problems Addressed Today  Coronary artery disease.  Paroxysmal atrial fibrillation.  Deconditioning.    Assessment and Plan     Kenneth Robles states that he is comfortable at rest but reports chronic dyspnea with exertion while using his walker.  Most likely this is related to deconditioning since he has no evidence of decompensated congestive heart failure on exam.  In addition, he sleeps flat without nocturnal dyspnea.  I urged him to get his constipation/loose stools under control so he can resume water  aerobics.  Otherwise, he appears stable from a cardiovascular perspective and reports no angina or palpitations. Currently on examination today he is not in heart failure.  Most of his problems currently focused around chronic constipation which may improve with Linzess.  He is becoming progressively more deconditioned and I encouraged him to resume water  aerobics at [facility] [employer].  He may also benefit by obtaining some weights that he can use to strengthen his arms at home and a pedal exercise machine to help strengthen his legs.  I asked him to be very cautious and to take fall precautions very seriously. I spent a long period of time talking to him about his anticoagulation options. The risks and benefits of anticoagulation therapy have been reviewed with the patient. The patient understands that anticoagulation is used to decrease thrombotic or clotting complications associated with atrial fibrillation/flutter, such as stroke and systemic embolization which can be disabling or fatal, but can, on occasion, lead to life-threatening bleeding complications including gastrointestinal or intracranial hemorrhage. The patient wishes to continue anticoagulation with apixaban  5 mg twice daily.  I have asked him to maintain a regular schedule for obtaining remote and comprehensive pacemaker interrogations over time.  I asked him to return for follow-up in 6 months time. The total time spent during this interview and exam with preparation and chart review was 30 minutes.          Current Medications (including today's revisions)   acetaminophen  (TYLENOL  EXTRA STRENGTH) 500 mg tablet Take one tablet by mouth every morning. Max of 4,000 mg of acetaminophen  in 24 hours.    allopurinol  (ZYLOPRIM ) 100 mg tablet Take one tablet by mouth daily.    atorvastatin  (LIPITOR) 20 mg tablet TAKE 1 TABLET EVERY DAY    coenzyme Q10 100 mg cap Take 50 mg by mouth at bedtime daily.    diclofenac sodium (VOLTAREN) 1 % topical gel Apply four g topically to affected area as Needed.    diphenhydrAMINE -acetaminophen  (TYLENOL  PM EXTRA STRENGTH) 25-500 mg tab tablet Take 1 tablet by mouth at bedtime daily.    duloxetine DR (CYMBALTA) 30 mg capsule Take one capsule by mouth daily.    ELIQUIS  5 mg tablet TAKE 1 TABLET TWICE DAILY    GLUCOSAMINE SULFATE PO Take 500 mg by mouth daily.    hydroxychloroquine  (PLAQUENIL ) 200 mg PO tablet Take one tablet by mouth daily.    ketoconazole  (NIZORAL ) 2 % topical shampoo APPLY TO DAMP SCALP, LATHER,  LEAVE ON 10 MINUTES THEN RINSE. CAN USE UP TO 3 TIMES WEEKLY FOR SEBORRHEIC DERMATITIS    LINZESS 145 mcg capsule Take one capsule by mouth daily 30 minutes before breakfast.    loratadine (CLARITIN) 10 mg tablet Take one tablet by mouth every morning.    meclizine (ANTIVERT) 25 mg tablet Take one tablet by mouth daily.    metoprolol  succinate XL (TOPROL  XL) 25 mg extended release tablet TAKE 1 TABLET EVERY DAY    mirabegron (MYRBETRIQ) 25 mg ER tablet Take one tablet by mouth daily.    nitroglycerin  (NITROSTAT ) 0.4 mg tablet DISSOLVE ONE TABLET UNDER THE TONGUE EVERY 5 MINUTES AS NEEDED FOR CHEST PAIN.  DO NOT EXCEED A TOTAL OF 3 DOSES IN 15 MINUTES    sennosides/docusate sodium (SENNA PLUS PO) Take  by mouth daily.    tamsulosin (FLOMAX) 0.4 mg capsule Take one capsule by mouth daily.                 [1]   Social History  Tobacco Use   Smoking Status Never   Smokeless Tobacco Never

## 2023-12-08 NOTE — Patient Instructions [37]
 Thank you for visiting our office today.    We would like to make the following medication adjustments:  NONE       Otherwise continue the same medications as you have been doing.          We will be pursuing the following tests after your appointment today:            We will plan to see you back in 6 months.  Please call us in the meantime with any questions or concerns.        Please allow 5-7 business days for our providers to review your results. All normal results will go to MyChart. If you do not have Mychart, it is strongly recommended to get this so you can easily view all your results. If you do not have mychart, we will attempt to call you once with normal lab and testing results. If we cannot reach you by phone with normal results, we will send you a letter.  If you have not heard the results of your testing after one week please give [place] a call.       Your Cardiovascular [facility] [place] Team Brett Canales, Pilar Jarvis, Shawna Orleans, and [place])  phone number is [phone].

## 2023-12-23 ENCOUNTER — Encounter: Admit: 2023-12-23 | Discharge: 2023-12-23 | Payer: MEDICARE

## 2023-12-25 ENCOUNTER — Encounter: Admit: 2023-12-25 | Discharge: 2023-12-25 | Payer: MEDICARE

## 2023-12-25 DIAGNOSIS — Z950 Presence of cardiac pacemaker: Principal | ICD-10-CM

## 2024-01-10 ENCOUNTER — Encounter: Admit: 2024-01-10 | Discharge: 2024-01-10 | Payer: MEDICARE

## 2024-01-11 ENCOUNTER — Encounter: Admit: 2024-01-11 | Discharge: 2024-01-11 | Payer: MEDICARE

## 2024-01-11 NOTE — Telephone Encounter [36]
 Missed transmission.  Called pt and had to lvm for pt to send his transmission asap.  SH

## 2024-01-27 ENCOUNTER — Encounter: Admit: 2024-01-27 | Discharge: 2024-01-28 | Payer: MEDICARE

## 2024-02-15 ENCOUNTER — Encounter: Admit: 2024-02-15 | Discharge: 2024-02-15 | Payer: MEDICARE

## 2024-02-15 MED ORDER — APIXABAN 5 MG PO TAB
5 mg | ORAL_TABLET | Freq: Two times a day (BID) | ORAL | 0 refills | 30.00000 days | Status: AC
Start: 2024-02-15 — End: ?

## 2024-02-15 MED ORDER — APIXABAN 5 MG PO TAB
5 mg | ORAL_TABLET | Freq: Two times a day (BID) | ORAL | 0 refills | 30.00000 days | Status: DC
Start: 2024-02-15 — End: 2024-02-15

## 2024-02-15 MED ORDER — APIXABAN 5 MG PO TAB
5 mg | ORAL_TABLET | Freq: Two times a day (BID) | ORAL | 3 refills | 30.00000 days | Status: AC
Start: 2024-02-15 — End: ?

## 2024-03-01 NOTE — Progress Notes [1]
 Date of Service: 03/02/2024    Subjective:             Kenneth Robles is a 89 y.o. male.    History of Present Illness  Return pt. LV 03/02/2023 with Dr. Quinton.    # Seborrheic dermatitis  Prior Hx:  - previously prescribed keto shampoo, thought this was too drying   - currently using OTC shampoo with conditioner and baby oil  - is bothered by the scaling of his scalp today  Current Tx:  - Ketoconazole  Shampoo 2% alternating with OTC anti-dandruff shampoo  - clobetasol solution   Interval Hx (03/02/24):  - Patient is compliant with ketoconazole  shampoo 2-3 times a week . Last refilled 06/2023.   - notes his L ear is similar in scaling      Biopsy (03/02/23)  - A. Skin, A) left neck DDx: sBCC > other, shave biopsy:  --Lichen planus-like keratosis     # H/o squamous cell carcinoma   - L superior helix s/p Mohs 2015      # Actinic Keratosis  - LN2 and PDT in the past.       # Melanocytic nevi distributed over the head, trunk, arms and legs.  - H/o blistering sunburns     Below not discussed today, but kept for note continuity:  # Dry lips  - pt regularly licks, bites lips  - asymptomatic  - improved from prior visit, not concerned by this today     Family Hx: Brother with skin cancer  Social Hx: Retired, used to work in radio producer. Live in [place], [place] .      Objective:         acetaminophen  (TYLENOL  EXTRA STRENGTH) 500 mg tablet Take one tablet by mouth every morning. Max of 4,000 mg of acetaminophen  in 24 hours.    allopurinol  (ZYLOPRIM ) 100 mg tablet Take one tablet by mouth daily.    apixaban  (ELIQUIS ) 5 mg tablet Take one tablet by mouth twice daily.    apixaban  (ELIQUIS ) 5 mg tablet Take one tablet by mouth twice daily for 30 days.    atorvastatin  (LIPITOR) 20 mg tablet TAKE 1 TABLET EVERY DAY    coenzyme Q10 100 mg cap Take 50 mg by mouth at bedtime daily.    diclofenac sodium (VOLTAREN) 1 % topical gel Apply four g topically to affected area as Needed.    diphenhydrAMINE -acetaminophen  (TYLENOL  PM EXTRA STRENGTH) 25-500 mg tab tablet Take 1 tablet by mouth at bedtime daily.    duloxetine DR (CYMBALTA) 30 mg capsule Take one capsule by mouth daily.    GLUCOSAMINE SULFATE PO Take 500 mg by mouth daily.    hydroxychloroquine  (PLAQUENIL ) 200 mg PO tablet Take one tablet by mouth daily.    ketoconazole  (NIZORAL ) 2 % topical shampoo APPLY TO DAMP SCALP, LATHER, LEAVE ON 10 MINUTES THEN RINSE. CAN USE UP TO 3 TIMES WEEKLY FOR SEBORRHEIC DERMATITIS    LINZESS 145 mcg capsule Take one capsule by mouth daily 30 minutes before breakfast.    loratadine (CLARITIN) 10 mg tablet Take one tablet by mouth every morning.    meclizine (ANTIVERT) 25 mg tablet Take one tablet by mouth daily.    metoprolol  succinate XL (TOPROL  XL) 25 mg extended release tablet TAKE 1 TABLET EVERY DAY    mirabegron (MYRBETRIQ) 25 mg ER tablet Take one tablet by mouth daily.    nitroglycerin  (NITROSTAT ) 0.4 mg tablet DISSOLVE ONE TABLET UNDER THE TONGUE EVERY 5 MINUTES AS NEEDED FOR  CHEST PAIN.  DO NOT EXCEED A TOTAL OF 3 DOSES IN 15 MINUTES    sennosides/docusate sodium (SENNA PLUS PO) Take  by mouth daily.    tamsulosin (FLOMAX) 0.4 mg capsule Take one capsule by mouth daily.     There were no vitals filed for this visit.    There is no height or weight on file to calculate BMI.     Physical Exam  Gen: NAD Neuro: Grossly unremarkable.  Psych:  Normal affect.  Answers questions appropriately. AAOx3    A skin exam was performed including scalp, face, neck, chest, abdomen, back, UE, LE, groin, buttocks, nails ( genitals not examined per pt preference ) which was notable for    Photodamage on sun-exposed areas of face, trunk, and extremities   Reticulated hyperpigmented brown macules distributed in sun-exposed areas  Bright red, pink macules and papules distributed over torso and extremities  Soft, pigmented, stuck-on-appearing papules with benign dermoscopic findings with cobblestoning and cerebriform patterns.   Multiple brown and tan evenly pigmented macules. All have symmetric similar dermascopic findings with primarily globular and reticular patterns.     Scar at site of prior non-melanoma skin cancer is noted with no evidence of recurrence.      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 Apply  to damp scalp 3 times per week. Leave on for 10 minutes and then rinse off. Use suds to wash face. Follow with clobetasol 0.05% solution to entire scalp.  Clobetasol solution only to be used to the damp scalp 2 or 3 times a week  - Apply 2 % ketoconazole   cream to affected areas on the ears until clear   - Risks, benefits, adverse effects of treatments discussed      Xerosis cutis  - Recommended thick emollients/ ointments of Vaseline, aquaphor, CeraVe, or Cetaphil that you can scoop out from a jar. Apply within 3 minutes after showering.   - Recommended Dove sensitive skin soap   - gently pat skin to dry     History of squamous cell carcinoma in 2015   - No evidence of recurrence    Screening exam for skin cancer   - RTC for any new, changing or symptomatic lesions  - Counseled on photoprotection with at least SPF 30+ sunscreen   -Recommend sun protective clothing (UPF shirts, broad-brimmed hats)    Multiple Benign Nevi  - Will cont to monitor  - Counseled on ABCD's of melanoma  - Counseled on photoprotection with at least SPF 30+ sunscreen   - Recommend OTC Vit. D supplementation daily (600-800 units daily)  - Encouraged to continue to monitor nevi at home    Seborrheic Keratoses  - Benign etiology discussed. Reassurance provided. Observe.    Lentigines  - Benign etiology discussed. Reassurance provided. Observe.    Cherry Angiomas  - Benign etiology discussed. Reassurance provided. Observe.    Diffuse Photodamage  - Counseled on photoprotection with at least SPF 30+ sunscreen (handout provided at check-out)  - Recommend OTC Vit. D supplementation daily (600-800 units daily)            RTC 1 year      In the presence of Baruch Coffer, MD,  I  have taken down these notes, Edrie File, [employer]. 03/01/2024 9:04 PM        03/01/2024 9:04 PM         Baruch Coffer, MD, FAAD  Assistant Professor, Dermatology  [facility]

## 2024-03-02 ENCOUNTER — Ambulatory Visit: Admit: 2024-03-02 | Discharge: 2024-03-03 | Payer: MEDICARE

## 2024-03-02 ENCOUNTER — Encounter: Admit: 2024-03-02 | Discharge: 2024-03-02 | Payer: MEDICARE

## 2024-03-02 DIAGNOSIS — L578 Other skin changes due to chronic exposure to nonionizing radiation: Secondary | ICD-10-CM

## 2024-03-02 DIAGNOSIS — L409 Psoriasis, unspecified: Secondary | ICD-10-CM

## 2024-03-02 DIAGNOSIS — L219 Seborrheic dermatitis, unspecified: Secondary | ICD-10-CM

## 2024-03-02 DIAGNOSIS — L408 Other psoriasis: Secondary | ICD-10-CM

## 2024-03-02 DIAGNOSIS — D1801 Hemangioma of skin and subcutaneous tissue: Secondary | ICD-10-CM

## 2024-03-02 DIAGNOSIS — D229 Melanocytic nevi, unspecified: Principal | ICD-10-CM

## 2024-03-02 DIAGNOSIS — Z1283 Encounter for screening for malignant neoplasm of skin: Secondary | ICD-10-CM

## 2024-03-02 DIAGNOSIS — L821 Other seborrheic keratosis: Secondary | ICD-10-CM

## 2024-03-02 DIAGNOSIS — L814 Other melanin hyperpigmentation: Secondary | ICD-10-CM

## 2024-03-02 MED ORDER — KETOCONAZOLE 2 % TP SHAM
TOPICAL | 11 refills | 30.00000 days | Status: AC
Start: 2024-03-02 — End: ?

## 2024-03-02 MED ORDER — KETOCONAZOLE 2 % TP CREA
TOPICAL | 3 refills | 30.00000 days | Status: AC
Start: 2024-03-02 — End: ?
